# Patient Record
Sex: Female | Born: 1940 | Race: White | Hispanic: No | Marital: Married | Smoking: Never smoker
Health system: Southern US, Community
[De-identification: ages and names within clinical notes are randomized; demographics above are authoritative.]

## PROBLEM LIST (undated history)

## (undated) DIAGNOSIS — G4700 Insomnia, unspecified: Secondary | ICD-10-CM

## (undated) DIAGNOSIS — R319 Hematuria, unspecified: Secondary | ICD-10-CM

## (undated) DIAGNOSIS — N6019 Diffuse cystic mastopathy of unspecified breast: Secondary | ICD-10-CM

## (undated) DIAGNOSIS — T8859XA Other complications of anesthesia, initial encounter: Secondary | ICD-10-CM

## (undated) DIAGNOSIS — D229 Melanocytic nevi, unspecified: Secondary | ICD-10-CM

## (undated) DIAGNOSIS — E785 Hyperlipidemia, unspecified: Secondary | ICD-10-CM

## (undated) DIAGNOSIS — R7309 Other abnormal glucose: Secondary | ICD-10-CM

## (undated) DIAGNOSIS — T7840XA Allergy, unspecified, initial encounter: Secondary | ICD-10-CM

## (undated) DIAGNOSIS — E039 Hypothyroidism, unspecified: Secondary | ICD-10-CM

## (undated) DIAGNOSIS — R05 Cough: Secondary | ICD-10-CM

## (undated) DIAGNOSIS — IMO0001 Reserved for inherently not codable concepts without codable children: Secondary | ICD-10-CM

## (undated) DIAGNOSIS — M810 Age-related osteoporosis without current pathological fracture: Secondary | ICD-10-CM

## (undated) DIAGNOSIS — I10 Essential (primary) hypertension: Secondary | ICD-10-CM

## (undated) DIAGNOSIS — N3000 Acute cystitis without hematuria: Secondary | ICD-10-CM

## (undated) DIAGNOSIS — D039 Melanoma in situ, unspecified: Secondary | ICD-10-CM

## (undated) DIAGNOSIS — E042 Nontoxic multinodular goiter: Secondary | ICD-10-CM

## (undated) DIAGNOSIS — R635 Abnormal weight gain: Secondary | ICD-10-CM

## (undated) DIAGNOSIS — J45909 Unspecified asthma, uncomplicated: Secondary | ICD-10-CM

## (undated) DIAGNOSIS — R51 Headache: Secondary | ICD-10-CM

## (undated) HISTORY — DX: Melanoma in situ, unspecified: D03.9

## (undated) HISTORY — DX: Cough: R05

## (undated) HISTORY — DX: Hematuria, unspecified: R31.9

## (undated) HISTORY — DX: Other abnormal glucose: R73.09

## (undated) HISTORY — DX: Melanocytic nevi, unspecified: D22.9

## (undated) HISTORY — DX: Allergy, unspecified, initial encounter: T78.40XA

## (undated) HISTORY — DX: Essential (primary) hypertension: I10

## (undated) HISTORY — DX: Abnormal weight gain: R63.5

## (undated) HISTORY — PX: POLYPECTOMY: SHX149

## (undated) HISTORY — DX: Headache: R51

## (undated) HISTORY — DX: Age-related osteoporosis without current pathological fracture: M81.0

## (undated) HISTORY — PX: COLONOSCOPY: SHX174

## (undated) HISTORY — DX: Reserved for inherently not codable concepts without codable children: IMO0001

## (undated) HISTORY — DX: Hypothyroidism, unspecified: E03.9

## (undated) HISTORY — DX: Diffuse cystic mastopathy of unspecified breast: N60.19

## (undated) HISTORY — DX: Insomnia, unspecified: G47.00

## (undated) HISTORY — DX: Acute cystitis without hematuria: N30.00

## (undated) HISTORY — DX: Hyperlipidemia, unspecified: E78.5

## (undated) HISTORY — DX: Nontoxic multinodular goiter: E04.2

## (undated) HISTORY — PX: SKIN CANCER EXCISION: SHX779

---

## 1972-11-05 HISTORY — PX: ABDOMINAL HYSTERECTOMY: SHX81

## 1972-11-05 HISTORY — PX: VAGINAL HYSTERECTOMY: SUR661

## 1974-11-05 HISTORY — PX: BREAST LUMPECTOMY: SHX2

## 1998-11-08 ENCOUNTER — Ambulatory Visit (HOSPITAL_COMMUNITY): Admission: RE | Admit: 1998-11-08 | Discharge: 1998-11-08 | Payer: Self-pay | Admitting: Obstetrics and Gynecology

## 1998-11-09 ENCOUNTER — Encounter: Payer: Self-pay | Admitting: Obstetrics and Gynecology

## 2000-03-11 ENCOUNTER — Other Ambulatory Visit: Admission: RE | Admit: 2000-03-11 | Discharge: 2000-03-11 | Payer: Self-pay | Admitting: Obstetrics and Gynecology

## 2000-03-13 ENCOUNTER — Encounter: Payer: Self-pay | Admitting: Obstetrics and Gynecology

## 2000-03-13 ENCOUNTER — Ambulatory Visit (HOSPITAL_COMMUNITY): Admission: RE | Admit: 2000-03-13 | Discharge: 2000-03-13 | Payer: Self-pay | Admitting: Obstetrics and Gynecology

## 2000-04-08 ENCOUNTER — Ambulatory Visit (HOSPITAL_BASED_OUTPATIENT_CLINIC_OR_DEPARTMENT_OTHER): Admission: RE | Admit: 2000-04-08 | Discharge: 2000-04-08 | Payer: Self-pay | Admitting: Specialist

## 2000-04-08 ENCOUNTER — Encounter (INDEPENDENT_AMBULATORY_CARE_PROVIDER_SITE_OTHER): Payer: Self-pay | Admitting: *Deleted

## 2001-07-17 ENCOUNTER — Other Ambulatory Visit: Admission: RE | Admit: 2001-07-17 | Discharge: 2001-07-17 | Payer: Self-pay | Admitting: Internal Medicine

## 2001-07-24 ENCOUNTER — Ambulatory Visit (HOSPITAL_COMMUNITY): Admission: RE | Admit: 2001-07-24 | Discharge: 2001-07-24 | Payer: Self-pay | Admitting: Obstetrics and Gynecology

## 2001-07-24 ENCOUNTER — Encounter: Payer: Self-pay | Admitting: Obstetrics and Gynecology

## 2002-07-28 ENCOUNTER — Encounter: Payer: Self-pay | Admitting: Obstetrics and Gynecology

## 2002-07-28 ENCOUNTER — Ambulatory Visit (HOSPITAL_COMMUNITY): Admission: RE | Admit: 2002-07-28 | Discharge: 2002-07-28 | Payer: Self-pay | Admitting: Obstetrics and Gynecology

## 2002-11-17 DIAGNOSIS — D229 Melanocytic nevi, unspecified: Secondary | ICD-10-CM

## 2002-11-17 HISTORY — DX: Melanocytic nevi, unspecified: D22.9

## 2003-09-14 ENCOUNTER — Ambulatory Visit (HOSPITAL_COMMUNITY): Admission: RE | Admit: 2003-09-14 | Discharge: 2003-09-14 | Payer: Self-pay | Admitting: Obstetrics and Gynecology

## 2004-09-15 ENCOUNTER — Ambulatory Visit (HOSPITAL_COMMUNITY): Admission: RE | Admit: 2004-09-15 | Discharge: 2004-09-15 | Payer: Self-pay | Admitting: Obstetrics and Gynecology

## 2005-08-23 ENCOUNTER — Ambulatory Visit: Payer: Self-pay | Admitting: Endocrinology

## 2005-08-29 ENCOUNTER — Ambulatory Visit: Payer: Self-pay | Admitting: Endocrinology

## 2005-09-13 ENCOUNTER — Ambulatory Visit: Payer: Self-pay | Admitting: Endocrinology

## 2005-10-02 ENCOUNTER — Ambulatory Visit: Payer: Self-pay | Admitting: Endocrinology

## 2006-09-19 ENCOUNTER — Ambulatory Visit: Payer: Self-pay | Admitting: Endocrinology

## 2006-09-19 LAB — CONVERTED CEMR LAB
Albumin: 4 g/dL (ref 3.5–5.2)
Alkaline Phosphatase: 78 units/L (ref 39–117)
Bacteria, U Microscopic: NEGATIVE /hpf
Basophils Absolute: 0 10*3/uL (ref 0.0–0.1)
CO2: 29 meq/L (ref 19–32)
Cholesterol: 169 mg/dL (ref 0–200)
Glomerular Filtration Rate, Af Am: 93 mL/min/{1.73_m2}
Glucose, Bld: 95 mg/dL (ref 70–99)
Ketones, ur: NEGATIVE mg/dL
LDL Cholesterol: 82 mg/dL (ref 0–99)
Monocytes Relative: 5.5 % (ref 3.0–11.0)
Platelets: 300 10*3/uL (ref 150–400)
Potassium: 4.5 meq/L (ref 3.5–5.1)
RBC: 4.42 M/uL (ref 3.87–5.11)
RDW: 12.9 % (ref 11.5–14.6)
Sodium: 142 meq/L (ref 135–145)
TSH: 0.86 microintl units/mL (ref 0.35–5.50)
Total Bilirubin: 0.8 mg/dL (ref 0.3–1.2)
Total Protein: 6.7 g/dL (ref 6.0–8.3)
VLDL: 16 mg/dL (ref 0–40)
pH: 7 (ref 5.0–8.0)

## 2006-09-23 ENCOUNTER — Ambulatory Visit: Payer: Self-pay | Admitting: Endocrinology

## 2007-03-19 ENCOUNTER — Ambulatory Visit: Payer: Self-pay | Admitting: Internal Medicine

## 2007-06-18 ENCOUNTER — Encounter: Payer: Self-pay | Admitting: Endocrinology

## 2007-07-21 ENCOUNTER — Encounter: Payer: Self-pay | Admitting: Endocrinology

## 2007-07-21 DIAGNOSIS — M810 Age-related osteoporosis without current pathological fracture: Secondary | ICD-10-CM

## 2007-07-21 DIAGNOSIS — E785 Hyperlipidemia, unspecified: Secondary | ICD-10-CM

## 2007-07-21 DIAGNOSIS — E782 Mixed hyperlipidemia: Secondary | ICD-10-CM | POA: Insufficient documentation

## 2007-07-21 DIAGNOSIS — M8580 Other specified disorders of bone density and structure, unspecified site: Secondary | ICD-10-CM | POA: Insufficient documentation

## 2007-07-21 DIAGNOSIS — E039 Hypothyroidism, unspecified: Secondary | ICD-10-CM

## 2007-07-21 HISTORY — DX: Hypothyroidism, unspecified: E03.9

## 2007-07-21 HISTORY — DX: Hyperlipidemia, unspecified: E78.5

## 2007-07-21 HISTORY — DX: Age-related osteoporosis without current pathological fracture: M81.0

## 2007-09-24 ENCOUNTER — Ambulatory Visit: Payer: Self-pay | Admitting: Endocrinology

## 2007-09-24 LAB — CONVERTED CEMR LAB
ALT: 22 units/L (ref 0–35)
AST: 21 units/L (ref 0–37)
Albumin: 4.1 g/dL (ref 3.5–5.2)
Alkaline Phosphatase: 82 units/L (ref 39–117)
BUN: 11 mg/dL (ref 6–23)
Basophils Relative: 0.8 % (ref 0.0–1.0)
Bilirubin, Direct: 0.1 mg/dL (ref 0.0–0.3)
Calcium: 9.5 mg/dL (ref 8.4–10.5)
Chloride: 105 meq/L (ref 96–112)
Cholesterol: 164 mg/dL (ref 0–200)
GFR calc Af Amer: 108 mL/min
GFR calc non Af Amer: 90 mL/min
Glucose, Bld: 105 mg/dL — ABNORMAL HIGH (ref 70–99)
HDL: 62.7 mg/dL (ref >39.0)
Hemoglobin: 13.9 g/dL (ref 12.0–15.0)
Ketones, ur: NEGATIVE mg/dL
Lymphocytes Relative: 22.3 % (ref 12.0–46.0)
Monocytes Absolute: 0.4 10*3/uL (ref 0.2–0.7)
Monocytes Relative: 5.3 % (ref 3.0–11.0)
Neutro Abs: 5.4 10*3/uL (ref 1.4–7.7)
Specific Gravity, Urine: 1.025 (ref 1.000–1.03)
VLDL: 19 mg/dL (ref 0–40)
pH: 6 (ref 5.0–8.0)

## 2007-09-26 ENCOUNTER — Ambulatory Visit: Payer: Self-pay | Admitting: Endocrinology

## 2007-09-26 DIAGNOSIS — R739 Hyperglycemia, unspecified: Secondary | ICD-10-CM | POA: Insufficient documentation

## 2007-09-26 DIAGNOSIS — R7309 Other abnormal glucose: Secondary | ICD-10-CM

## 2007-09-26 DIAGNOSIS — N3000 Acute cystitis without hematuria: Secondary | ICD-10-CM | POA: Insufficient documentation

## 2007-09-26 HISTORY — DX: Acute cystitis without hematuria: N30.00

## 2007-09-26 HISTORY — DX: Other abnormal glucose: R73.09

## 2007-10-13 ENCOUNTER — Telehealth (INDEPENDENT_AMBULATORY_CARE_PROVIDER_SITE_OTHER): Payer: Self-pay | Admitting: *Deleted

## 2007-11-12 ENCOUNTER — Encounter: Payer: Self-pay | Admitting: Endocrinology

## 2008-06-08 ENCOUNTER — Ambulatory Visit: Payer: Self-pay | Admitting: Endocrinology

## 2008-06-08 DIAGNOSIS — G4700 Insomnia, unspecified: Secondary | ICD-10-CM

## 2008-06-08 DIAGNOSIS — R635 Abnormal weight gain: Secondary | ICD-10-CM

## 2008-06-08 DIAGNOSIS — R51 Headache: Secondary | ICD-10-CM | POA: Insufficient documentation

## 2008-06-08 DIAGNOSIS — R519 Headache, unspecified: Secondary | ICD-10-CM | POA: Insufficient documentation

## 2008-06-08 HISTORY — DX: Headache: R51

## 2008-06-08 HISTORY — DX: Abnormal weight gain: R63.5

## 2008-06-08 HISTORY — DX: Insomnia, unspecified: G47.00

## 2008-06-23 ENCOUNTER — Encounter: Payer: Self-pay | Admitting: Endocrinology

## 2008-08-05 ENCOUNTER — Encounter: Payer: Self-pay | Admitting: Endocrinology

## 2008-10-05 ENCOUNTER — Ambulatory Visit: Payer: Self-pay | Admitting: Endocrinology

## 2008-10-05 DIAGNOSIS — R319 Hematuria, unspecified: Secondary | ICD-10-CM

## 2008-10-05 HISTORY — DX: Hematuria, unspecified: R31.9

## 2008-10-11 ENCOUNTER — Telehealth: Payer: Self-pay | Admitting: Endocrinology

## 2008-10-11 ENCOUNTER — Ambulatory Visit: Payer: Self-pay | Admitting: Endocrinology

## 2008-10-21 ENCOUNTER — Ambulatory Visit: Payer: Self-pay | Admitting: Endocrinology

## 2008-10-21 ENCOUNTER — Telehealth (INDEPENDENT_AMBULATORY_CARE_PROVIDER_SITE_OTHER): Payer: Self-pay | Admitting: *Deleted

## 2008-10-22 LAB — CONVERTED CEMR LAB
Bacteria, UA: NEGATIVE
Bilirubin Urine: NEGATIVE
Ketones, ur: NEGATIVE mg/dL
pH: 6 (ref 5.0–8.0)

## 2008-11-02 ENCOUNTER — Telehealth (INDEPENDENT_AMBULATORY_CARE_PROVIDER_SITE_OTHER): Payer: Self-pay | Admitting: *Deleted

## 2008-11-17 ENCOUNTER — Telehealth (INDEPENDENT_AMBULATORY_CARE_PROVIDER_SITE_OTHER): Payer: Self-pay | Admitting: *Deleted

## 2008-11-19 ENCOUNTER — Ambulatory Visit: Payer: Self-pay | Admitting: Endocrinology

## 2008-11-22 DIAGNOSIS — I10 Essential (primary) hypertension: Secondary | ICD-10-CM | POA: Insufficient documentation

## 2008-11-22 HISTORY — DX: Essential (primary) hypertension: I10

## 2008-12-20 ENCOUNTER — Ambulatory Visit: Payer: Self-pay | Admitting: Gastroenterology

## 2008-12-31 ENCOUNTER — Telehealth: Payer: Self-pay | Admitting: Gastroenterology

## 2009-01-05 ENCOUNTER — Ambulatory Visit: Payer: Self-pay | Admitting: Gastroenterology

## 2009-01-09 ENCOUNTER — Encounter: Payer: Self-pay | Admitting: Gastroenterology

## 2009-06-20 ENCOUNTER — Ambulatory Visit: Payer: Self-pay | Admitting: Endocrinology

## 2009-06-20 DIAGNOSIS — R05 Cough: Secondary | ICD-10-CM | POA: Insufficient documentation

## 2009-06-20 DIAGNOSIS — R059 Cough, unspecified: Secondary | ICD-10-CM | POA: Insufficient documentation

## 2009-06-20 DIAGNOSIS — Z789 Other specified health status: Secondary | ICD-10-CM | POA: Insufficient documentation

## 2009-06-20 HISTORY — DX: Cough, unspecified: R05.9

## 2009-08-05 LAB — HM MAMMOGRAPHY: HM Mammogram: NORMAL

## 2009-10-10 ENCOUNTER — Ambulatory Visit: Payer: Self-pay | Admitting: Internal Medicine

## 2009-10-10 ENCOUNTER — Ambulatory Visit: Payer: Self-pay | Admitting: Endocrinology

## 2009-10-10 DIAGNOSIS — N6019 Diffuse cystic mastopathy of unspecified breast: Secondary | ICD-10-CM | POA: Insufficient documentation

## 2009-10-10 DIAGNOSIS — N6011 Diffuse cystic mastopathy of right breast: Secondary | ICD-10-CM | POA: Insufficient documentation

## 2009-10-10 HISTORY — DX: Diffuse cystic mastopathy of unspecified breast: N60.19

## 2009-10-11 ENCOUNTER — Encounter: Admission: RE | Admit: 2009-10-11 | Discharge: 2009-10-11 | Payer: Self-pay | Admitting: Endocrinology

## 2009-10-11 DIAGNOSIS — E042 Nontoxic multinodular goiter: Secondary | ICD-10-CM

## 2009-10-11 HISTORY — DX: Nontoxic multinodular goiter: E04.2

## 2009-10-27 ENCOUNTER — Ambulatory Visit: Payer: Self-pay | Admitting: Endocrinology

## 2009-11-05 HISTORY — PX: THYROID SURGERY: SHX805

## 2009-11-11 ENCOUNTER — Telehealth: Payer: Self-pay | Admitting: Endocrinology

## 2010-01-04 ENCOUNTER — Other Ambulatory Visit: Admission: RE | Admit: 2010-01-04 | Discharge: 2010-01-04 | Payer: Self-pay | Admitting: Interventional Radiology

## 2010-01-04 ENCOUNTER — Encounter: Payer: Self-pay | Admitting: Endocrinology

## 2010-01-04 ENCOUNTER — Encounter: Admission: RE | Admit: 2010-01-04 | Discharge: 2010-01-04 | Payer: Self-pay | Admitting: Endocrinology

## 2010-01-06 ENCOUNTER — Ambulatory Visit: Payer: Self-pay | Admitting: Endocrinology

## 2010-01-06 LAB — CONVERTED CEMR LAB
ALT: 23 units/L (ref 0–35)
Albumin: 3.8 g/dL (ref 3.5–5.2)
Alkaline Phosphatase: 80 units/L (ref 39–117)
Bilirubin, Direct: 0.1 mg/dL (ref 0.0–0.3)
Cholesterol: 213 mg/dL — ABNORMAL HIGH (ref 0–200)
Total Protein: 6.9 g/dL (ref 6.0–8.3)
VLDL: 29.2 mg/dL (ref 0.0–40.0)

## 2010-01-09 ENCOUNTER — Ambulatory Visit: Payer: Self-pay | Admitting: Endocrinology

## 2010-01-12 ENCOUNTER — Telehealth (INDEPENDENT_AMBULATORY_CARE_PROVIDER_SITE_OTHER): Payer: Self-pay | Admitting: *Deleted

## 2010-03-08 ENCOUNTER — Encounter: Payer: Self-pay | Admitting: Endocrinology

## 2010-10-23 ENCOUNTER — Encounter: Payer: Self-pay | Admitting: Endocrinology

## 2010-10-23 ENCOUNTER — Ambulatory Visit: Payer: Self-pay | Admitting: Endocrinology

## 2010-10-23 DIAGNOSIS — IMO0001 Reserved for inherently not codable concepts without codable children: Secondary | ICD-10-CM | POA: Insufficient documentation

## 2010-10-23 HISTORY — DX: Reserved for inherently not codable concepts without codable children: IMO0001

## 2010-10-23 LAB — CONVERTED CEMR LAB
Albumin: 4.4 g/dL (ref 3.5–5.2)
Alkaline Phosphatase: 75 units/L (ref 39–117)
BUN: 14 mg/dL (ref 6–23)
Basophils Absolute: 0.1 10*3/uL (ref 0.0–0.1)
Bilirubin Urine: NEGATIVE
Bilirubin, Direct: 0.2 mg/dL (ref 0.0–0.3)
CO2: 27 meq/L (ref 19–32)
Calcium: 9.9 mg/dL (ref 8.4–10.5)
Cholesterol: 257 mg/dL — ABNORMAL HIGH (ref 0–200)
Creatinine, Ser: 0.7 mg/dL (ref 0.4–1.2)
Direct LDL: 153.6 mg/dL
Eosinophils Absolute: 0.1 10*3/uL (ref 0.0–0.7)
Glucose, Bld: 89 mg/dL (ref 70–99)
Hgb A1c MFr Bld: 5.6 % (ref 4.6–6.5)
Lymphocytes Relative: 21.2 % (ref 12.0–46.0)
MCHC: 34.8 g/dL (ref 30.0–36.0)
MCV: 93.2 fL (ref 78.0–100.0)
Monocytes Absolute: 0.6 10*3/uL (ref 0.1–1.0)
Neutrophils Relative %: 70.1 % (ref 43.0–77.0)
Nitrite: NEGATIVE
RDW: 13.9 % (ref 11.5–14.6)
Total Protein, Urine: NEGATIVE mg/dL
Triglycerides: 227 mg/dL — ABNORMAL HIGH (ref 0.0–149.0)
pH: 6 (ref 5.0–8.0)

## 2010-10-25 ENCOUNTER — Telehealth: Payer: Self-pay | Admitting: Internal Medicine

## 2010-11-25 ENCOUNTER — Encounter: Payer: Self-pay | Admitting: Obstetrics and Gynecology

## 2010-12-03 LAB — CONVERTED CEMR LAB
ALT: 18 units/L (ref 0–35)
ALT: 21 units/L (ref 0–35)
AST: 19 units/L (ref 0–37)
Albumin: 4.2 g/dL (ref 3.5–5.2)
Alkaline Phosphatase: 80 units/L (ref 39–117)
Basophils Absolute: 0.1 10*3/uL (ref 0.0–0.1)
Basophils Relative: 0.9 % (ref 0.0–3.0)
Bilirubin Urine: NEGATIVE
Calcium, Total (PTH): 9.7 mg/dL (ref 8.4–10.5)
Calcium: 9.1 mg/dL (ref 8.4–10.5)
Cholesterol: 207 mg/dL (ref 0–200)
Creatinine, Ser: 0.6 mg/dL (ref 0.4–1.2)
Crystals: NEGATIVE
Direct LDL: 107.9 mg/dL
Direct LDL: 129 mg/dL
Eosinophils Relative: 1.2 % (ref 0.0–5.0)
GFR calc Af Amer: 129 mL/min
GFR calc non Af Amer: 107 mL/min
GFR calc non Af Amer: 88.83 mL/min (ref >60)
Glucose, Bld: 97 mg/dL (ref 70–99)
HCT: 40.5 % (ref 36.0–46.0)
HDL: 65.7 mg/dL (ref >39.00)
Hemoglobin: 13.5 g/dL (ref 12.0–15.0)
Hemoglobin: 14 g/dL (ref 12.0–15.0)
Hgb A1c MFr Bld: 5.1 % (ref 4.6–6.0)
Ketones, ur: NEGATIVE mg/dL
Lymphocytes Relative: 22.1 % (ref 12.0–46.0)
Lymphs Abs: 1.7 10*3/uL (ref 0.7–4.0)
MCHC: 34.4 g/dL (ref 30.0–36.0)
Monocytes Absolute: 0.5 10*3/uL (ref 0.1–1.0)
Monocytes Relative: 5.7 % (ref 3.0–12.0)
Neutro Abs: 4.9 10*3/uL (ref 1.4–7.7)
Neutro Abs: 6.8 10*3/uL (ref 1.4–7.7)
Nitrite: NEGATIVE
PTH: 33.1 pg/mL (ref 14.0–72.0)
Platelets: 268 10*3/uL (ref 150–400)
Potassium: 4.1 meq/L (ref 3.5–5.1)
RBC: 4.26 M/uL (ref 3.87–5.11)
RDW: 12 % (ref 11.5–14.6)
Sodium: 140 meq/L (ref 135–145)
Specific Gravity, Urine: 1.02 (ref 1.000–1.030)
TSH: 0.87 microintl units/mL (ref 0.35–5.50)
Total Bilirubin: 0.9 mg/dL (ref 0.3–1.2)
Total CHOL/HDL Ratio: 3
Total Protein, Urine: NEGATIVE mg/dL
Triglycerides: 136 mg/dL (ref 0–149)
Urine Glucose: NEGATIVE mg/dL
Urobilinogen, UA: 0.2 (ref 0.0–1.0)
VLDL: 26.6 mg/dL (ref 0.0–40.0)
VLDL: 27 mg/dL (ref 0–40)
Varicella IgG: 1.92 — ABNORMAL HIGH
WBC: 9.2 10*3/uL (ref 4.5–10.5)
pH: 6 (ref 5.0–8.0)

## 2010-12-07 NOTE — Progress Notes (Signed)
Summary: UTI sxs-SAE pt  Phone Note Call from Patient Call back at Home Phone [phone]   Caller: Patient Summary of Call: PT was told by SAE on Monday that she had a slight bladder infection on phone tree message and to callback for rx if she was having sxs. Per pt, she is having frequency and urgency. Would like rx called in for UTI sxs to [facility]-please advise Initial call taken by: Brenton Grills CMA Duncan Dull),  October 25, 2010 10:48 AM  Follow-up for Phone Call        Rx Called In to [facility]-left message for pt to callback office to inform Follow-up by: Brenton Grills CMA Duncan Dull),  October 25, 2010 11:05 AM  Additional Follow-up for Phone Call Additional follow up Details #1::        pt informed. Copy of labs mailed to pt's address on file per pt Additional Follow-up by: Brenton Grills CMA Duncan Dull),  October 25, 2010 2:02 PM    New/Updated Medications: CIPROFLOXACIN HCL 500 MG TABS (CIPROFLOXACIN HCL) One by mouth two times a day for 5 days Prescriptions: CIPROFLOXACIN HCL 500 MG TABS (CIPROFLOXACIN HCL) One by mouth two times a day for 5 days  #10 x 1   Entered and Authorized by:   Etta Grandchild MD   Signed by:   Etta Grandchild MD on 10/25/2010   Method used:   Historical   RxID:   [id number]

## 2010-12-07 NOTE — Progress Notes (Signed)
Summary: referral  Phone Note Call from Patient   Caller: Patient [phone] Complaint: Chest Pain Summary of Call: pt called to check status of referral to surgeon. please advise Initial call taken by: Margaret Pyle, CMA,  January 12, 2010 4:50 PM  Follow-up for Phone Call        There is no order  in computer for a surgical referral. Follow-up by: Dagoberto Reef,  January 13, 2010 8:00 AM  Additional Follow-up for Phone Call Additional follow up Details #1::        i see it on 01/09/10 already done Shelbie Proctor  January 16, 2010 8:04 AM   Additional Follow-up by: Minus Breeding MD,  January 13, 2010 12:32 PM    Additional Follow-up for Phone Call Additional follow up Details #2::    Dr Everardo All i see it in ov on 01/09/10 but you never put a referral in for it .Need a referral.  Thanks  Follow-up by: Dagoberto Reef,  January 13, 2010 2:21 PM  Additional Follow-up for Phone Call Additional follow up Details #3:: Details for Additional Follow-up Action Taken: i discussed with Maisha.  my computer shows it was ordered, but her computer does not show it.  i have resent.  please advise pt this appears to be a computer problem, and we are resending. Additional Follow-up by: Minus Breeding MD,  January 13, 2010 4:15 PM

## 2010-12-07 NOTE — Assessment & Plan Note (Signed)
Summary: YEARLY FU/ MEDICARE/ LABS LATER/NWS   Vital Signs:  Patient profile:   70 year old female Height:      60 inches (152.40 cm) Weight:      144.38 pounds (65.63 kg) BMI:     28.30 O2 Sat:      94 % on Room air Temp:     98.4 degrees F (36.89 degrees C) oral Pulse rate:   77 / minute Pulse rhythm:   regular BP sitting:   132 / 76  (right arm) Cuff size:   regular  Vitals Entered By: Brenton Grills CMA Duncan Dull) (October 23, 2010 11:12 AM)  O2 Flow:  Room air CC: Yearly F/U/discuss Lipitor/pt declined flu shot/aj Is Patient Diabetic? No Comments Pt declined tetanus (allergic reaction)   Primary Provider:  Minus Breeding MD  CC:  Yearly F/U/discuss Lipitor/pt declined flu shot/aj.  History of Present Illness: pt is here for medicare welllness visit.  she denies memory loss and depression.  she says she is able to perform activities of daily living without assistance.  she has no limitations to physical activity.  Current Medications (verified): 1)  Synthroid 25 Mcg  Tabs (Levothyroxine Sodium) .... Take 1 By Mouth Qd 2)  Calcium 1250 Mg  Tabs (Calcium Carbonate) .... Take 1 By Mouth Qd 3)  Vitamin D 1000 Unit  Tabs (Cholecalciferol) .... Take 1 By Mouth Qd 4)  Trazodone Hcl 100 Mg  Tabs (Trazodone Hcl) .... At Bedtime As Needed Sleep 5)  Benicar 20 Mg Tabs (Olmesartan Medoxomil) .Marland Kitchen.. 1 Qd 6)  Fosamax 70 Mg Tabs (Alendronate Sodium) .Marland Kitchen.. 1 Q Week.  Pt Requests Brand Name. 7)  Lipitor 20 Mg Tabs (Atorvastatin Calcium) .Marland Kitchen.. 1 Once Daily 8)  Fish Oil 435 Mg Caps (Omega-3 Fatty Acids) .... (400mg ) 1 By Mouth Once Daily  Allergies (verified): 1)  ! Sulfa 2)  ! Lisinopril (Lisinopril) 3)  ! Cozaar (Losartan Potassium) 4)  ! Lipitor (Atorvastatin Calcium)  Past History:  Past Medical History: Benign (L) Thyroid Nodule Hematuria, w/u NEG ACUTE CYSTITIS (ICD-595.0) HYPERGLYCEMIA (ICD-790.29) ROUTINE GENERAL MEDICAL EXAM@HEALTH  CARE FACL (ICD-V70.0) OSTEOPOROSIS  (ICD-733.00) HYPOTHYROIDISM (ICD-244.9) HYPERLIPIDEMIA (ICD-272.4) Hypertension  gyn: [facility]: [facility] ([facility])  Family History: Reviewed history from 09/26/2007 and no changes required. patient's mother had ovarian cancer.  Her sister has lymphoma, breast cancer, and kidney cancer.  Another sister has leukemia  Social History: Reviewed history from 09/26/2007 and no changes required. married doesn't work outside the home  Review of Systems  The patient denies vision loss and decreased hearing.    Physical Exam  General:  normal appearance.   Eyes:  (sees opthal) Ears:  grossly normal hearing.   Msk:  pt easily and quickly performs "get-up-and-go" from a sitting position  Psych:  remembers 3/3 at 5 minutes.  excellent recall.  can easily read and write a sentence.  alert and oriented x 3 Additional Exam:  SEPARATE EVALUATION FOLLOWS--EACH PROBLEM HERE IS NEW, NOT RESPONDING TO TREATMENT, OR POSES SIGNIFICANT RISK TO THE PATIENT'S HEALTH: HISTORY OF THE PRESENT ILLNESS: pt says lipitor causes myalgias, worst at the legs.   PAST MEDICAL HISTORY reviewed and up to date today REVIEW OF SYSTEMS: she also has muscle weakness PHYSICAL EXAMINATION: psych:  does not appear anxious nor depressed LAB/XRAY RESULTS: Cholesterol LDL  153.6 mg/dL Sed Rate             [H]  23 mm/hr  IMPRESSION: dyslipidemia, therapy is limited by perceived drug intolerance PLAN:  see instruction page    Impression & Recommendations:  Problem # 1:  ROUTINE GENERAL MEDICAL EXAM@HEALTH  CARE FACL (ICD-V70.0)  Medications Added to Medication List This Visit: 1)  Fish Oil 435 Mg Caps (Omega-3 fatty acids) .... (400mg ) 1 by mouth once daily 2)  Crestor 5 Mg Tabs (Rosuvastatin calcium) .Marland Kitchen.. 1 tab once daily 3)  Colestipol Hcl 1 Gm Tabs (Colestipol hcl) .... 3 tabs once daily 4)  Alendronate Sodium 70 Mg Tabs (Alendronate sodium) .Marland Kitchen.. 1 tab q week  Other Orders: EKG w/ Interpretation  (93000) TLB-Lipid Panel (80061-LIPID) TLB-BMP (Basic Metabolic Panel-BMET) (80048-METABOL) TLB-CBC Platelet - w/Differential (85025-CBCD) TLB-Hepatic/Liver Function Pnl (80076-HEPATIC) TLB-TSH (Thyroid Stimulating Hormone) (84443-TSH) TLB-A1C / Hgb A1C (Glycohemoglobin) (83036-A1C) TLB-Udip w/ Micro (81001-URINE) TLB-Sedimentation Rate (ESR) (85652-ESR) Est. Patient Level III (16109) Medicare -1st Annual Wellness Visit [phone])  Patient Instructions: 1)  blood tests are being ordered for you today.  please call [phone] to hear your test results. 2)  pending the test results, please take crestor 5 mg once daily, and colestipol 3x 1 gram once daily. 3)  when you run out of benicar samples, call us, so i can change it to a similar generic 4)  here is a list of medicare-covered preventive services 5)  please consider these measures for your health:  minimize alcohol.  do not use tobacco products.  have a colonoscopy at least every 10 years from age 46.  keep firearms safely stored.  always use seat belts.  have working smoke alarms in your home.  see an eye doctor and dentist regularly.  never drive under the influence of alcohol or drugs (including prescription drugs).  those with fair skin should take precautions against the sun. 6)  please let me know what your wishes would be, if artificial life support measures should become necessary.  it is critically important to prevent falling down (keep floor areas well-lit, dry, and free of loose objects) 7)  (update; we discussed code status.  pt requests full code, but would not want to be started or maintained on artificial life-support measures if there was not a reasonable chance of recovery) 8)  (update: i left message on phone-tree:  rx as we discussed.  call if urinary sxs.  go to lab in 1-2 mos for lipids 272.0, and liver v58.69) Prescriptions: ALENDRONATE SODIUM 70 MG TABS (ALENDRONATE SODIUM) 1 tab q week  #12 x 3   Entered and Authorized  by:   Minus Breeding MD   Signed by:   Minus Breeding MD on 10/23/2010   Method used:   Faxed to ...       [facility] (mail-order)             , [place]         Ph: [phone]       Fax: [phone]   RxID:   [id number] COLESTIPOL HCL 1 GM TABS (COLESTIPOL HCL) 3 tabs once daily  #270 x 3   Entered and Authorized by:   Minus Breeding MD   Signed by:   Minus Breeding MD on 10/23/2010   Method used:   Faxed to ...       [facility] (mail-order)             , [place]         Ph: [phone]       Fax: [phone]   RxID:   [id number] CRESTOR 5 MG TABS (ROSUVASTATIN CALCIUM) 1 tab once daily  #  90 x 3   Entered and Authorized by:   Minus Breeding MD   Signed by:   Minus Breeding MD on 10/23/2010   Method used:   Faxed to ...       [facility] (mail-order)             , [place]         Ph: [phone]       Fax: [phone]   RxID:   [id number]    Orders Added: 1)  EKG w/ Interpretation [93000] 2)  TLB-Lipid Panel [80061-LIPID] 3)  TLB-BMP (Basic Metabolic Panel-BMET) [80048-METABOL] 4)  TLB-CBC Platelet - w/Differential [85025-CBCD] 5)  TLB-Hepatic/Liver Function Pnl [80076-HEPATIC] 6)  TLB-TSH (Thyroid Stimulating Hormone) [84443-TSH] 7)  TLB-A1C / Hgb A1C (Glycohemoglobin) [83036-A1C] 8)  TLB-Udip w/ Micro [81001-URINE] 9)  TLB-Sedimentation Rate (ESR) [85652-ESR] 10)  Est. Patient Level III [53664] 11)  Medicare -1st Annual Wellness Visit [G0438]   Not Administered:    Influenza Vaccine not given due to: declined     Preventive Care Screening  Mammogram:    Date:  06/05/2010    Results:  normal   Pap Smear:    Date:  04/05/2010    Results:  normal

## 2010-12-07 NOTE — Consult Note (Signed)
Summary: [facility]   Imported By: Lester Summerville 04/12/2010 07:47:30  _____________________________________________________________________  External Attachment:    Type:   Image     Comment:   External Document

## 2010-12-07 NOTE — Progress Notes (Signed)
Summary: Fosamax rx  Phone Note Call from Patient Call back at [phone]   Summary of Call: Patient called after receiving her BD results from the phone tree. Patient would like her Fosamax prescription sent to [facility] and prefers the brand name if at all possible. Patient also wanted to know if she should continue taking Ca+ and Vit D with that prescription. Please advise if patient should make any changes to her current regimen. Initial call taken by: Lucious Groves,  November 11, 2009 11:59 AM  Follow-up for Phone Call        i sent rx.  please continue same ca++ and vitamin-d. Follow-up by: Minus Breeding MD,  November 11, 2009 12:47 PM  Additional Follow-up for Phone Call Additional follow up Details #1::        Patient notified. Additional Follow-up by: Lucious Groves,  November 11, 2009 1:20 PM    New/Updated Medications: FOSAMAX 70 MG TABS (ALENDRONATE SODIUM) 1 q week.  pt requests brand name. Prescriptions: FOSAMAX 70 MG TABS (ALENDRONATE SODIUM) 1 q week.  pt requests brand name.  #4 x 11   Entered and Authorized by:   Minus Breeding MD   Signed by:   Minus Breeding MD on 11/11/2009   Method used:   Electronically to        [facility] [address] (retail)       [address] [place], [place]  [address]       Ph: [phone]       Fax: [phone]   RxID:   [id number]

## 2010-12-07 NOTE — Assessment & Plan Note (Signed)
Summary: DISCUSS RESULTS/NWS  #   Vital Signs:  Patient profile:   70 year old female Height:      60 inches (152.40 cm) Weight:      146.38 pounds (66.54 kg) O2 Sat:      97 % on Room air Temp:     98.3 degrees F (36.83 degrees C) oral Pulse rate:   80 / minute BP sitting:   128 / 70  (left arm) Cuff size:   regular  Vitals Entered By: Josph Macho RMA (January 09, 2010 1:13 PM)  O2 Flow:  Room air CC: Discuss results/ CF   Primary Provider:  Minus Breeding MD  CC:  Discuss results/ CF.  History of Present Illness: the status of 3 chronic medical problems is addressed today: pt is here to f/u goiter.  no neck pain. htn:  she takes bp meds as rx'ed. dyslipidemia:  she had myalgias on lipitor 40/day, and wants to try 20.   Current Medications (verified): 1)  Synthroid 25 Mcg  Tabs (Levothyroxine Sodium) .... Take 1 By Mouth Qd 2)  Calcium 1250 Mg  Tabs (Calcium Carbonate) .... Take 1 By Mouth Qd 3)  Vitamin D 1000 Unit  Tabs (Cholecalciferol) .... Take 1 By Mouth Qd 4)  Trazodone Hcl 100 Mg  Tabs (Trazodone Hcl) .... At Bedtime As Needed Sleep 5)  Simvastatin 40 Mg Tabs (Simvastatin) .Marland Kitchen.. 1 Qd 6)  Benicar 20 Mg Tabs (Olmesartan Medoxomil) .Marland Kitchen.. 1 Qd 7)  Fosamax 70 Mg Tabs (Alendronate Sodium) .Marland Kitchen.. 1 Q Week.  Pt Requests Brand Name.  Allergies (verified): 1)  ! Sulfa 2)  ! Lisinopril (Lisinopril) 3)  ! Cozaar (Losartan Potassium) 4)  ! Lipitor (Atorvastatin Calcium)  Past History:  Past Medical History: Last updated: 11/19/2008 Benign (L) Thyroid Nodule Hematuria, w/u NEG ACUTE CYSTITIS (ICD-595.0) HYPERGLYCEMIA (ICD-790.29) ROUTINE GENERAL MEDICAL EXAM@HEALTH  CARE FACL (ICD-V70.0) OSTEOPOROSIS (ICD-733.00) HYPOTHYROIDISM (ICD-244.9) HYPERLIPIDEMIA (ICD-272.4) Hypertension  Review of Systems       denies difficulty swallowing or breathing.  Physical Exam  General:  normal appearance.   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 1 once daily  Other Orders: Surgical Referral (Surgery) Est. Patient Level IV (16109)  Patient Instructions: 1)  refer surgery. you will be called with a day and time for an appointment 2)  try lipitor at 20 mg once daily. in 3 months, come in for lipids 272.0 and liver v58.69 3)  we can add colestipol later if necessary. 4)  same benicar. Prescriptions: FOSAMAX 70 MG TABS (ALENDRONATE SODIUM) 1 q week.  pt requests brand name.  #4 x 11   Entered and Authorized by:   Minus Breeding MD   Signed by:   Minus Breeding MD on 01/09/2010   Method used:   Electronically to        [facility] (mail-order)             ,          Ph: [phone]       Fax: [phone]   RxID:   [id number] LIPITOR 20 MG TABS (ATORVASTATIN CALCIUM) 1 once daily  #  90 x 3   Entered and Authorized by:   Minus Breeding MD   Signed by:   Minus Breeding MD on 01/09/2010   Method used:   Electronically to        [facility] (mail-order)             ,          Ph: [phone]       Fax: [phone]   RxID:   [id number]   Preventive Care Screening  Mammogram:    Date:  05/05/2009    Results:  historical

## 2011-03-07 DIAGNOSIS — D039 Melanoma in situ, unspecified: Secondary | ICD-10-CM

## 2011-03-07 HISTORY — DX: Melanoma in situ, unspecified: D03.9

## 2011-03-23 NOTE — Op Note (Signed)
[facility]  Patient:    NATHANIEL, YADEN                      MRN: [id number] Proc. Date: 04/08/00 Attending:  Earvin Hansen L. Shon Hough, M.D. CC:         Yaakov Guthrie. Shon Hough, M.D. x 2                           Operative Report  PREOPERATIVE DIAGNOSIS:  POSTOPERATIVE DIAGNOSIS:  PROCEDURE:  Excision of mass with liposuction assistance.  SURGEON:  Yaakov Guthrie. Shon Hough, M.D.  ANESTHESIA:  Local using ______ solution, total of 150 cc.  INDICATIONS:  A 70 year old lady with enlarging mass involving the left scapula area and increased discomfort, especially when she is driving or putting pressure against _____.  The patient also feels the mass has also grown more over the last few months.  DESCRIPTION OF PROCEDURE:  The patient was taken to the operating room and placed in the prone position, and was given adequate prep to her back using Betadine soap and solution, and walled off with sterile towels and drapes so as to make a sterile field.  The area was injected with ______ solution, measuring approximately 12 x 12 cm.  After this, sterile dressing placed. Liposuction was fashioned over the mass from the 6 oclock and 9 oclock position using a spatula catheter, 18-3 and 4s, removing large amounts of what looks like fatty material.  After we had taken the area down to a flattened contour, the wounds then were closed with 5-0 nylon after the wound was drained with a #7 Blake drain, and brought out through one of the incisions, and secured with a 5-0 Prolene. Large amounts of fluffy dressings were applied and Hypafix tape.  She withstood the procedures very well and was taken to recovery in good condition. DD:  04/08/00 TD:  04/12/00 Job: [id number]

## 2011-05-07 ENCOUNTER — Other Ambulatory Visit: Payer: Self-pay | Admitting: Dermatology

## 2011-07-11 LAB — HM MAMMOGRAPHY: HM Mammogram: NORMAL

## 2011-07-24 ENCOUNTER — Other Ambulatory Visit: Payer: Self-pay | Admitting: Physician Assistant

## 2011-07-30 ENCOUNTER — Telehealth: Payer: Self-pay | Admitting: *Deleted

## 2011-07-30 ENCOUNTER — Other Ambulatory Visit (INDEPENDENT_AMBULATORY_CARE_PROVIDER_SITE_OTHER): Payer: Self-pay

## 2011-07-30 DIAGNOSIS — E042 Nontoxic multinodular goiter: Secondary | ICD-10-CM

## 2011-07-30 LAB — TSH: TSH: 1.78 u[IU]/mL (ref 0.35–5.50)

## 2011-07-30 LAB — T4, FREE: Free T4: 0.83 ng/dL (ref 0.60–1.60)

## 2011-07-30 NOTE — Telephone Encounter (Signed)
i ordered

## 2011-07-30 NOTE — Telephone Encounter (Signed)
Pt had thyroid surgery last month at [facility] and her MD wants her to have Thyroid profile done (pt wants to do labs here so she doesn't have to go to [facility]). Pt wants to come in for labs later today if possible-please advise

## 2011-07-30 NOTE — Telephone Encounter (Signed)
Pt informed

## 2011-08-08 ENCOUNTER — Encounter: Payer: Self-pay | Admitting: Endocrinology

## 2011-08-08 ENCOUNTER — Ambulatory Visit (INDEPENDENT_AMBULATORY_CARE_PROVIDER_SITE_OTHER): Payer: Medicare PPO | Admitting: Endocrinology

## 2011-08-08 VITALS — BP 110/74 | HR 61 | Temp 98.6°F | Ht 60.0 in | Wt 146.4 lb

## 2011-08-08 DIAGNOSIS — E042 Nontoxic multinodular goiter: Secondary | ICD-10-CM

## 2011-08-08 MED ORDER — LEVOTHYROXINE SODIUM 25 MCG PO TABS: 25.0000 ug | ORAL_TABLET | Freq: Every day | ORAL | Status: DC

## 2011-08-08 NOTE — Progress Notes (Signed)
Subjective:    Patient ID: Donna Andersen, female    DOB: Aug 22, 1941, 70 y.o.   MRN: [id number]  HPI Pt had a right thyroid lobectomy at [facility], approx 6 weeks ago.  pt states she feels well in general.  She takes no thyroid medication.   Past Medical History  Diagnosis Date  . Acute cystitis 09/26/2007  . COUGH DUE TO ACE INHIBITORS 06/20/2009  . 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 GOITER, MULTINODULAR 10/11/2009    Benign L thyroid nodule  . Headache 06/08/2008  . HEMATURIA UNSPECIFIED 10/05/2008  . HYPERGLYCEMIA 09/26/2007  . HYPERLIPIDEMIA 07/21/2007  . HYPERTENSION 11/22/2008  . HYPOTHYROIDISM 07/21/2007  . INSOMNIA 06/08/2008  . MYALGIA 10/23/2010  . OSTEOPOROSIS 07/21/2007  . WEIGHT GAIN 06/08/2008    Past Surgical History  Procedure Date  . Abdominal hysterectomy     History   Social History  . Marital Status: Married    Spouse Name: N/A    Number of Children: N/A  . Years of Education: N/A   Occupational History  . Homemaker    Social History Main Topics  . Smoking status: Never Smoker   . Smokeless tobacco: Not on file  . Alcohol Use: Not on file  . Drug Use: Not on file  . Sexually Active: Not on file   Other Topics Concern  . Not on file   Social History Narrative   Does not work outside the home    Current Outpatient Prescriptions on File Prior to Visit  Medication Sig Dispense Refill  . alendronate (FOSAMAX) 70 MG tablet Take 70 mg by mouth every 7 (seven) days. Take with a full glass of water on an empty stomach.       . calcium carbonate (OS-CAL - DOSED IN MG OF ELEMENTAL CALCIUM) 1250 MG tablet Take 1 tablet by mouth daily.        . cholecalciferol (VITAMIN D) 1000 UNITS tablet Take 1,000 Units by mouth daily.        . colestipol (COLESTID) 1 G tablet Take 3 g by mouth daily.        Marland Kitchen levothyroxine (SYNTHROID, LEVOTHROID) 25 MCG tablet Take 25 mcg by mouth daily.        Marland Kitchen olmesartan (BENICAR) 20 MG tablet Take 20 mg by mouth daily.        . Omega-3  Fatty Acids (FISH OIL) 435 MG CAPS Take 1 capsule by mouth daily. 400mg        . rosuvastatin (CRESTOR) 5 MG tablet Take 5 mg by mouth daily.        . traZODone (DESYREL) 100 MG tablet Take 100 mg by mouth at bedtime as needed. For sleep         Allergies  Allergen Reactions  . Atorvastatin     REACTION: dental pain, myalgias, and headache  . Lisinopril     REACTION: cough  . Losartan Potassium     REACTION: drowsiness  . Sulfonamide Derivatives     REACTION: Rash, Hives, Asthma    Family History  Problem Relation Age of Onset  . Cancer Mother     Ovarian Cancer  . Lymphoma Sister   . Cancer Sister     Breast Cancer, Kidney Cancer  . Leukemia Sister     BP 110/74  Pulse 61  Temp(Src) 98.6 F (37 C) (Oral)  Ht 5' (1.524 m)  Wt 146 lb 6.4 oz (66.407 kg)  BMI 28.59 kg/m2  SpO2 96%    Review of  Systems Denies weight change.    Objective:   Physical Exam VITAL SIGNS:  See vs page GENERAL: no distress Neck: a healed scar is present.  i do not appreciate a nodule in the thyroid or elsewhere in the neck  Lab Results  Component Value Date   TSH 1.78 07/30/2011  (i reviewed pathol)    Assessment & Plan:  Multinodular goiter, s/p right lobect.

## 2011-08-08 NOTE — Patient Instructions (Addendum)
i have sent a prescription to your pharmacy, to resume the thyroid medication.   Please come back for a regular physical appointment in 4 months.

## 2012-01-25 ENCOUNTER — Ambulatory Visit (INDEPENDENT_AMBULATORY_CARE_PROVIDER_SITE_OTHER)
Admission: RE | Admit: 2012-01-25 | Discharge: 2012-01-25 | Disposition: A | Discharge status: Home / Self Care | Payer: Medicare PPO | Source: Ambulatory Visit

## 2012-01-25 ENCOUNTER — Encounter: Payer: Self-pay | Admitting: Endocrinology

## 2012-01-25 ENCOUNTER — Other Ambulatory Visit (INDEPENDENT_AMBULATORY_CARE_PROVIDER_SITE_OTHER): Payer: Medicare PPO

## 2012-01-25 ENCOUNTER — Ambulatory Visit (INDEPENDENT_AMBULATORY_CARE_PROVIDER_SITE_OTHER): Payer: Medicare PPO | Admitting: Endocrinology

## 2012-01-25 VITALS — BP 122/68 | HR 66 | Temp 97.6°F | Ht 60.0 in | Wt 147.8 lb

## 2012-01-25 DIAGNOSIS — Z79899 Other long term (current) drug therapy: Secondary | ICD-10-CM

## 2012-01-25 DIAGNOSIS — I10 Essential (primary) hypertension: Secondary | ICD-10-CM

## 2012-01-25 DIAGNOSIS — E039 Hypothyroidism, unspecified: Secondary | ICD-10-CM

## 2012-01-25 DIAGNOSIS — E785 Hyperlipidemia, unspecified: Secondary | ICD-10-CM

## 2012-01-25 DIAGNOSIS — M810 Age-related osteoporosis without current pathological fracture: Secondary | ICD-10-CM

## 2012-01-25 DIAGNOSIS — E042 Nontoxic multinodular goiter: Secondary | ICD-10-CM

## 2012-01-25 DIAGNOSIS — R319 Hematuria, unspecified: Secondary | ICD-10-CM

## 2012-01-25 DIAGNOSIS — R7309 Other abnormal glucose: Secondary | ICD-10-CM

## 2012-01-25 LAB — CBC WITH DIFFERENTIAL/PLATELET
Basophils Relative: 0.6 % (ref 0.0–3.0)
Eosinophils Absolute: 0.1 10*3/uL (ref 0.0–0.7)
Eosinophils Relative: 1.3 % (ref 0.0–5.0)
HCT: 39 % (ref 36.0–46.0)
Lymphs Abs: 1.9 10*3/uL (ref 0.7–4.0)
MCHC: 34 g/dL (ref 30.0–36.0)
MCV: 92.3 fl (ref 78.0–100.0)
Monocytes Absolute: 0.6 10*3/uL (ref 0.1–1.0)
Neutro Abs: 7.1 10*3/uL (ref 1.4–7.7)
Neutrophils Relative %: 72.3 % (ref 43.0–77.0)
RBC: 4.23 Mil/uL (ref 3.87–5.11)

## 2012-01-25 LAB — LDL CHOLESTEROL, DIRECT: Direct LDL: 130.4 mg/dL

## 2012-01-25 LAB — HEPATIC FUNCTION PANEL
ALT: 21 U/L (ref 0–35)
Albumin: 4.4 g/dL (ref 3.5–5.2)
Bilirubin, Direct: 0.1 mg/dL (ref 0.0–0.3)
Total Protein: 7.3 g/dL (ref 6.0–8.3)

## 2012-01-25 LAB — BASIC METABOLIC PANEL
Chloride: 101 mEq/L (ref 96–112)
GFR: 85.4 mL/min (ref >60.00)
Potassium: 4.2 mEq/L (ref 3.5–5.1)
Sodium: 138 mEq/L (ref 135–145)

## 2012-01-25 LAB — LIPID PANEL
Cholesterol: 221 mg/dL — ABNORMAL HIGH (ref 0–200)
HDL: 65.4 mg/dL (ref >39.00)
Total CHOL/HDL Ratio: 3
VLDL: 34.4 mg/dL (ref 0.0–40.0)

## 2012-01-25 LAB — TSH: TSH: 1.21 u[IU]/mL (ref 0.35–5.50)

## 2012-01-25 LAB — URINALYSIS, ROUTINE W REFLEX MICROSCOPIC
Bilirubin Urine: NEGATIVE
Ketones, ur: NEGATIVE
Leukocytes, UA: NEGATIVE
Urine Glucose: NEGATIVE
Urobilinogen, UA: 0.2 (ref 0.0–1.0)

## 2012-01-25 MED ORDER — LEVOTHYROXINE SODIUM 25 MCG PO TABS: 25.0000 ug | ORAL_TABLET | Freq: Every day | ORAL | Status: DC

## 2012-01-25 MED ORDER — ROSUVASTATIN CALCIUM 5 MG PO TABS: 5.0000 mg | ORAL_TABLET | Freq: Every day | ORAL | Status: DC

## 2012-01-25 NOTE — Patient Instructions (Addendum)
blood tests are being requested for you today.  You will receive a letter with results. please consider these measures for your health:  minimize alcohol.  do not use tobacco products.  have a colonoscopy at least every 10 years from age 69.  Women should have an annual mammogram from age 8.  keep firearms safely stored.  always use seat belts.  have working smoke alarms in your home.  see an eye doctor and dentist regularly.  never drive under the influence of alcohol or drugs (including prescription drugs).  those with fair skin should take precautions against the sun.  please let me know what your wishes would be, if artificial life support measures should become necessary.  it is critically important to prevent falling down (keep floor areas well-lit, dry, and free of loose objects.  If you have a cane, walker, or wheelchair, you should use it, even for short trips around the house.  Also, try not to rush).  Please schedule a bone-density test.   (update: we discussed code status.  pt requests full code, but would not want to be started or maintained on artificial life-support measures if there was not a reasonable chance of recovery)

## 2012-01-25 NOTE — Progress Notes (Signed)
Subjective:    Patient ID: Donna Andersen, female    DOB: [birth year], 71 y.o.   MRN: [id number]  HPI here for regular wellness examination.  she's feeling pretty well in general, and says chronic med probs are stable, except as noted below.   Past Medical History  Diagnosis Date  . Acute cystitis 09/26/2007  . COUGH DUE TO ACE INHIBITORS 06/20/2009  . 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 GOITER, MULTINODULAR 10/11/2009    Benign L thyroid nodule  . Headache 06/08/2008  . HEMATURIA UNSPECIFIED 10/05/2008  . HYPERGLYCEMIA 09/26/2007  . HYPERLIPIDEMIA 07/21/2007  . HYPERTENSION 11/22/2008  . HYPOTHYROIDISM 07/21/2007  . INSOMNIA 06/08/2008  . MYALGIA 10/23/2010  . OSTEOPOROSIS 07/21/2007  . WEIGHT GAIN 06/08/2008    Past Surgical History  Procedure Date  . Abdominal hysterectomy     History   Social History  . Marital Status: Married    Spouse Name: N/A    Number of Children: N/A  . Years of Education: N/A   Occupational History  . Homemaker    Social History Main Topics  . Smoking status: Never Smoker   . Smokeless tobacco: Not on file  . Alcohol Use: Not on file  . Drug Use: Not on file  . Sexually Active: Not on file   Other Topics Concern  . Not on file   Social History Narrative   Does not work outside the home    Current Outpatient Prescriptions on File Prior to Visit  Medication Sig Dispense Refill  . alendronate (FOSAMAX) 70 MG tablet Take 70 mg by mouth every 7 (seven) days. Take with a full glass of water on an empty stomach.       . calcium carbonate (OS-CAL - DOSED IN MG OF ELEMENTAL CALCIUM) 1250 MG tablet Take 1 tablet by mouth daily.        . cholecalciferol (VITAMIN D) 1000 UNITS tablet Take 1,000 Units by mouth daily.        . colestipol (COLESTID) 1 G tablet Take 3 g by mouth daily.        Marland Kitchen levothyroxine (SYNTHROID, LEVOTHROID) 25 MCG tablet Take 1 tablet (25 mcg total) by mouth daily.  90 tablet  3  . olmesartan (BENICAR) 20 MG tablet Take 20 mg by  mouth daily.        . Omega-3 Fatty Acids (FISH OIL) 435 MG CAPS Take 1 capsule by mouth daily. 400mg        . rosuvastatin (CRESTOR) 5 MG tablet Take 5 mg by mouth daily.        . traZODone (DESYREL) 100 MG tablet Take 100 mg by mouth at bedtime as needed. For sleep         Allergies  Allergen Reactions  . Atorvastatin     REACTION: dental pain, myalgias, and headache  . Lisinopril     REACTION: cough  . Losartan Potassium     REACTION: drowsiness  . Sulfonamide Derivatives     REACTION: Rash, Hives, Asthma    Family History  Problem Relation Age of Onset  . Cancer Mother     Ovarian Cancer  . Lymphoma Sister   . Cancer Sister     Breast Cancer, Kidney Cancer  . Leukemia Sister     There were no vitals taken for this visit.  Review of Systems  Constitutional: Negative for fever and unexpected weight change.  HENT: Negative for hearing loss.   Eyes: Negative for visual disturbance.  Respiratory: Negative for  shortness of breath.   Cardiovascular: Negative for chest pain.  Gastrointestinal: Negative for anal bleeding.  Genitourinary: Negative for hematuria and vaginal bleeding.  Musculoskeletal: Negative for back pain.  Skin: Negative for rash.  Neurological: Negative for syncope, numbness and headaches.  Hematological: Does not bruise/bleed easily.  Psychiatric/Behavioral: Negative for dysphoric mood.      Objective:   Physical Exam VS: see vs page GEN: no distress HEAD: head: no deformity eyes: no periorbital swelling, no proptosis external nose and ears are normal mouth: no lesion seen Neck: a healed scar is present.  There is a 1 cm diameter left thyroid nodule.  i do not appreciate a nodule in the elsewhere in the neck CHEST WALL: no deformity LUNGS:  Clear to auscultation BREASTS:  sees gyn CV: reg rate and rhythm, no murmur ABD: abdomen is soft, nontender.  no hepatosplenomegaly.  not distended.  no hernia. GENITALIA/RECTAL: sees gyn.     MUSCULOSKELETAL: muscle bulk and strength are grossly normal.  no obvious joint swelling.  gait is normal and steady EXTEMITIES: no deformity.  no ulcer on the feet.  feet are of normal color and temp.  no edema.   PULSES: dorsalis pedis intact bilat.  no carotid bruit NEURO:  cn 2-12 grossly intact.   readily moves all 4's.  sensation is intact to touch on the feet SKIN:  Normal texture and temperature.  No rash or suspicious lesion is visible.   NODES:  None palpable at the neck PSYCH: alert, oriented x3.  Does not appear anxious nor depressed.    Assessment & Plan:  Wellness visit today, with problems stable, except as noted.

## 2012-01-26 ENCOUNTER — Encounter: Payer: Self-pay | Admitting: Endocrinology

## 2012-01-28 ENCOUNTER — Telehealth: Payer: Self-pay | Admitting: *Deleted

## 2012-01-28 LAB — PTH, INTACT AND CALCIUM
Calcium, Total (PTH): 9.5 mg/dL (ref 8.4–10.5)
PTH: 51.9 pg/mL (ref 14.0–72.0)

## 2012-01-28 NOTE — Telephone Encounter (Signed)
Called pt to inform of CPX results. Left message for pt to callback office. (Letter also mailed to pt)

## 2012-01-29 NOTE — Telephone Encounter (Signed)
Left message for pt to callback office.  

## 2012-01-30 NOTE — Telephone Encounter (Signed)
Left message for pt to callback office.  

## 2012-01-30 NOTE — Telephone Encounter (Signed)
Pt informed of lab results. 

## 2012-04-02 ENCOUNTER — Telehealth: Payer: Self-pay | Admitting: Endocrinology

## 2012-04-02 NOTE — Telephone Encounter (Signed)
Caller: [name]/Patient; PCP: Romero Belling; CB#: [phone];  Call regarding Cystitis; Reports dysuria, urgency and frequency. Onset 04/02/12.  Afebrile. Menopausal. Informed of MD policy that must be seen for antibiotics. Advised to see MD within 24 hrs for one or more urinary tract symptoms and has not been previously evaluated per Urinary Symptoms Guideline. No appts remain for 04/01/12 and no blank slots for 04/03/12. Advised to call office 04/03/12 AM for appt.  Stated will go to UC now.

## 2012-04-15 ENCOUNTER — Other Ambulatory Visit: Payer: Self-pay | Admitting: Dermatology

## 2012-08-15 ENCOUNTER — Ambulatory Visit (INDEPENDENT_AMBULATORY_CARE_PROVIDER_SITE_OTHER): Payer: Medicare PPO | Admitting: Endocrinology

## 2012-08-15 ENCOUNTER — Encounter: Payer: Self-pay | Admitting: Endocrinology

## 2012-08-15 VITALS — BP 122/72 | HR 63 | Temp 98.2°F | Wt 144.0 lb

## 2012-08-15 DIAGNOSIS — E042 Nontoxic multinodular goiter: Secondary | ICD-10-CM

## 2012-08-15 MED ORDER — OLMESARTAN MEDOXOMIL 20 MG PO TABS: 20.0000 mg | ORAL_TABLET | Freq: Every day | ORAL | Status: DC

## 2012-08-15 NOTE — Progress Notes (Signed)
Subjective:    Patient ID: Donna Andersen, female    DOB: [birth year], 71 y.o.   MRN: [id number]  HPI Pt returns for f/u of HTN.  takes and tolerates benicar well.   Past Medical History  Diagnosis Date  . Acute cystitis 09/26/2007  . COUGH DUE TO ACE INHIBITORS 06/20/2009  . 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 GOITER, MULTINODULAR 10/11/2009    Benign L thyroid nodule  . Headache 06/08/2008  . HEMATURIA UNSPECIFIED 10/05/2008  . HYPERGLYCEMIA 09/26/2007  . HYPERLIPIDEMIA 07/21/2007  . HYPERTENSION 11/22/2008  . HYPOTHYROIDISM 07/21/2007  . INSOMNIA 06/08/2008  . MYALGIA 10/23/2010  . OSTEOPOROSIS 07/21/2007  . WEIGHT GAIN 06/08/2008    Past Surgical History  Procedure Date  . Abdominal hysterectomy     History   Social History  . Marital Status: Married    Spouse Name: N/A    Number of Children: N/A  . Years of Education: N/A   Occupational History  . Homemaker    Social History Main Topics  . Smoking status: Never Smoker   . Smokeless tobacco: Not on file  . Alcohol Use: Not on file  . Drug Use: Not on file  . Sexually Active: Not on file   Other Topics Concern  . Not on file   Social History Narrative   Does not work outside the home    Current Outpatient Prescriptions on File Prior to Visit  Medication Sig Dispense Refill  . alendronate (FOSAMAX) 70 MG tablet Take 70 mg by mouth every 7 (seven) days. Take with a full glass of water on an empty stomach.       . calcium carbonate (OS-CAL - DOSED IN MG OF ELEMENTAL CALCIUM) 1250 MG tablet Take 1 tablet by mouth daily.        . cholecalciferol (VITAMIN D) 1000 UNITS tablet Take 1,000 Units by mouth daily.        . colestipol (COLESTID) 1 G tablet Take 3 g by mouth daily.        Marland Kitchen levothyroxine (SYNTHROID, LEVOTHROID) 25 MCG tablet Take 1 tablet (25 mcg total) by mouth daily.  90 tablet  3  . olmesartan (BENICAR) 20 MG tablet Take 1 tablet (20 mg total) by mouth daily.  90 tablet  3  . Omega-3 Fatty Acids (FISH  OIL) 435 MG CAPS Take 1 capsule by mouth daily. 400mg        . rosuvastatin (CRESTOR) 5 MG tablet Take 1 tablet (5 mg total) by mouth daily.  90 tablet  3  . traZODone (DESYREL) 100 MG tablet Take 100 mg by mouth at bedtime as needed. For sleep         Allergies  Allergen Reactions  . Atorvastatin     REACTION: dental pain, myalgias, and headache  . Lisinopril     REACTION: cough  . Losartan Potassium     REACTION: drowsiness  . Sulfonamide Derivatives     REACTION: Rash, Hives, Asthma    Family History  Problem Relation Age of Onset  . Cancer Mother     Ovarian Cancer  . Lymphoma Sister   . Cancer Sister     Breast Cancer, Kidney Cancer  . Leukemia Sister     BP 122/72  Pulse 63  Temp 98.2 F (36.8 C) (Oral)  Wt 144 lb (65.318 kg)    Review of Systems Denies dizziness and LOC    Objective:   Physical Exam Neck: a healed scar is present.  There is  a 1 cm diameter left thyroid nodule.  i do not appreciate a nodule in the elsewhere in the neck LUNGS:  Clear to auscultation HEART:  Regular rate and rhythm without murmurs noted. Normal S1,S2.       Assessment & Plan:  HTN, well-controlled

## 2012-08-15 NOTE — Patient Instructions (Addendum)
Please continue the same medications Please come back for a regular physical any day after 01/24/13.

## 2012-08-22 ENCOUNTER — Encounter: Payer: Self-pay | Admitting: Endocrinology

## 2013-01-26 ENCOUNTER — Ambulatory Visit (INDEPENDENT_AMBULATORY_CARE_PROVIDER_SITE_OTHER): Payer: Medicare PPO | Admitting: Endocrinology

## 2013-01-26 VITALS — BP 122/78 | HR 71 | Wt 149.0 lb

## 2013-01-26 DIAGNOSIS — Z79899 Other long term (current) drug therapy: Secondary | ICD-10-CM

## 2013-01-26 DIAGNOSIS — I10 Essential (primary) hypertension: Secondary | ICD-10-CM

## 2013-01-26 DIAGNOSIS — E039 Hypothyroidism, unspecified: Secondary | ICD-10-CM

## 2013-01-26 DIAGNOSIS — M810 Age-related osteoporosis without current pathological fracture: Secondary | ICD-10-CM

## 2013-01-26 DIAGNOSIS — E785 Hyperlipidemia, unspecified: Secondary | ICD-10-CM

## 2013-01-26 DIAGNOSIS — R7309 Other abnormal glucose: Secondary | ICD-10-CM

## 2013-01-26 DIAGNOSIS — N3000 Acute cystitis without hematuria: Secondary | ICD-10-CM

## 2013-01-26 DIAGNOSIS — R319 Hematuria, unspecified: Secondary | ICD-10-CM

## 2013-01-26 DIAGNOSIS — E890 Postprocedural hypothyroidism: Secondary | ICD-10-CM | POA: Insufficient documentation

## 2013-01-26 LAB — BASIC METABOLIC PANEL
BUN: 17 mg/dL (ref 6–23)
CO2: 26 mEq/L (ref 19–32)
Calcium: 8.9 mg/dL (ref 8.4–10.5)
GFR: 90.96 mL/min (ref >60.00)
Glucose, Bld: 94 mg/dL (ref 70–99)

## 2013-01-26 LAB — URINALYSIS, ROUTINE W REFLEX MICROSCOPIC
Bilirubin Urine: NEGATIVE
Ketones, ur: NEGATIVE
Nitrite: NEGATIVE
Total Protein, Urine: NEGATIVE
Urine Glucose: NEGATIVE
pH: 6 (ref 5.0–8.0)

## 2013-01-26 LAB — CBC WITH DIFFERENTIAL/PLATELET
Basophils Relative: 1 % (ref 0.0–3.0)
Eosinophils Absolute: 0.2 10*3/uL (ref 0.0–0.7)
Eosinophils Relative: 2 % (ref 0.0–5.0)
Hemoglobin: 13.6 g/dL (ref 12.0–15.0)
Lymphocytes Relative: 23.7 % (ref 12.0–46.0)
MCHC: 34.6 g/dL (ref 30.0–36.0)
MCV: 91.8 fl (ref 78.0–100.0)
Monocytes Absolute: 0.5 10*3/uL (ref 0.1–1.0)
Neutro Abs: 5.2 10*3/uL (ref 1.4–7.7)
Neutrophils Relative %: 67.3 % (ref 43.0–77.0)
RBC: 4.28 Mil/uL (ref 3.87–5.11)
WBC: 7.7 10*3/uL (ref 4.5–10.5)

## 2013-01-26 LAB — HEPATIC FUNCTION PANEL
ALT: 22 U/L (ref 0–35)
AST: 18 U/L (ref 0–37)
Alkaline Phosphatase: 63 U/L (ref 39–117)
Total Bilirubin: 0.5 mg/dL (ref 0.3–1.2)

## 2013-01-26 LAB — LDL CHOLESTEROL, DIRECT: Direct LDL: 145.4 mg/dL

## 2013-01-26 NOTE — Patient Instructions (Addendum)
please consider these measures for your health:  minimize alcohol.  do not use tobacco products.  have a colonoscopy at least every 10 years from age 70.  Women should have an annual mammogram from age 40.  keep firearms safely stored.  always use seat belts.  have working smoke alarms in your home.  see an eye doctor and dentist regularly.  never drive under the influence of alcohol or drugs (including prescription drugs).  those with fair skin should take precautions against the sun. it is critically important to prevent falling down (keep floor areas well-lit, dry, and free of loose objects.  If you have a cane, walker, or wheelchair, you should use it, even for short trips around the house.  Also, try not to rush). blood tests are being requested for you today.  We'll contact you with results. Please return in 1 year.  

## 2013-01-26 NOTE — Progress Notes (Signed)
Subjective:    Patient ID: Donna Andersen, female    DOB: December 06, 1940, 72 y.o.   MRN: [id number]  HPI here for regular wellness examination.  she's feeling pretty well in general, and says chronic med probs are stable, except as noted below Past Medical History  Diagnosis Date  . Acute cystitis 09/26/2007  . COUGH DUE TO ACE INHIBITORS 06/20/2009  . 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 GOITER, MULTINODULAR 10/11/2009    Benign L thyroid nodule  . Headache 06/08/2008  . HEMATURIA UNSPECIFIED 10/05/2008  . HYPERGLYCEMIA 09/26/2007  . HYPERLIPIDEMIA 07/21/2007  . HYPERTENSION 11/22/2008  . HYPOTHYROIDISM 07/21/2007  . INSOMNIA 06/08/2008  . MYALGIA 10/23/2010  . OSTEOPOROSIS 07/21/2007  . WEIGHT GAIN 06/08/2008    Past Surgical History  Procedure Laterality Date  . Abdominal hysterectomy      History   Social History  . Marital Status: Married    Spouse Name: N/A    Number of Children: N/A  . Years of Education: N/A   Occupational History  . Homemaker    Social History Main Topics  . Smoking status: Never Smoker   . Smokeless tobacco: Not on file  . Alcohol Use: Not on file  . Drug Use: Not on file  . Sexually Active: Not on file   Other Topics Concern  . Not on file   Social History Narrative   Does not work outside the home    Current Outpatient Prescriptions on File Prior to Visit  Medication Sig Dispense Refill  . calcium carbonate (OS-CAL - DOSED IN MG OF ELEMENTAL CALCIUM) 1250 MG tablet Take 1 tablet by mouth daily.        . cholecalciferol (VITAMIN D) 1000 UNITS tablet Take 1,000 Units by mouth daily.        . colestipol (COLESTID) 1 G tablet Take 3 g by mouth daily.        Marland Kitchen levothyroxine (SYNTHROID, LEVOTHROID) 25 MCG tablet Take 1 tablet (25 mcg total) by mouth daily.  90 tablet  3  . olmesartan (BENICAR) 20 MG tablet Take 1 tablet (20 mg total) by mouth daily.  90 tablet  3  . Omega-3 Fatty Acids (FISH OIL) 435 MG CAPS Take 1 capsule by mouth daily. 400mg         . rosuvastatin (CRESTOR) 5 MG tablet Take 1 tablet (5 mg total) by mouth daily.  90 tablet  3  . traZODone (DESYREL) 100 MG tablet Take 100 mg by mouth at bedtime as needed. For sleep        No current facility-administered medications on file prior to visit.    Allergies  Allergen Reactions  . Atorvastatin     REACTION: dental pain, myalgias, and headache  . Lisinopril     REACTION: cough  . Losartan Potassium     REACTION: drowsiness  . Sulfonamide Derivatives     REACTION: Rash, Hives, Asthma    Family History  Problem Relation Age of Onset  . Cancer Mother     Ovarian Cancer  . Lymphoma Sister   . Cancer Sister     Breast Cancer, Kidney Cancer  . Leukemia Sister     BP 122/78  Pulse 71  Wt 149 lb (67.586 kg)  BMI 29.1 kg/m2  SpO2 99%   Review of Systems  Constitutional: Negative for fever and unexpected weight change.  HENT: Negative for hearing loss.   Eyes: Negative for visual disturbance.  Respiratory: Negative for shortness of breath.   Cardiovascular:  Negative for chest pain.  Gastrointestinal: Negative for anal bleeding.  Endocrine: Negative for polyuria.  Genitourinary: Negative for hematuria.  Musculoskeletal: Negative for back pain.  Skin: Negative for rash.  Allergic/Immunologic: Positive for environmental allergies.  Neurological: Negative for syncope, numbness and headaches.  Hematological: Does not bruise/bleed easily.  Psychiatric/Behavioral: Negative for dysphoric mood.      Objective:   Physical Exam VS: see vs page GEN: no distress HEAD: head: no deformity eyes: no periorbital swelling, no proptosis external nose and ears are normal mouth: no lesion seen NECK: a healed scar is present.  i do not appreciate a nodule in the thyroid or elsewhere in the neck CHEST WALL: no deformity LUNGS:  Clear to auscultation BREASTS:  sees gyn CV: reg rate and rhythm, no murmur ABD: abdomen is soft, nontender.  no hepatosplenomegaly.  not  distended.  no hernia GENITALIA/RECTAL: sees gyn MUSCULOSKELETAL: muscle bulk and strength are grossly normal.  no obvious joint swelling.  gait is normal and steady EXTEMITIES: no deformity.  no ulcer on the feet.  feet are of normal color and temp.  no edema PULSES: dorsalis pedis intact bilat.  no carotid bruit NEURO:  cn 2-12 grossly intact.   readily moves all 4's.  sensation is intact to touch on the feet SKIN:  Normal texture and temperature.  No rash or suspicious lesion is visible.   NODES:  None palpable at the neck PSYCH: alert, oriented x3.  Does not appear anxious nor depressed.   (ecg machine is broken)    Assessment & Plan:  Wellness visit today, with problems stable, except as noted.  we discussed code status.  pt requests full code, but would not want to be started or maintained on artificial life-support measures if there was not a reasonable chance of recovery

## 2013-01-27 ENCOUNTER — Encounter: Payer: Self-pay | Admitting: Endocrinology

## 2013-01-27 LAB — PTH, INTACT AND CALCIUM: Calcium, Total (PTH): 9.4 mg/dL (ref 8.4–10.5)

## 2013-08-07 ENCOUNTER — Other Ambulatory Visit: Payer: Self-pay | Admitting: *Deleted

## 2013-08-07 MED ORDER — LEVOTHYROXINE SODIUM 25 MCG PO TABS: 25.0000 ug | ORAL_TABLET | Freq: Every day | ORAL | Status: DC

## 2013-09-10 ENCOUNTER — Other Ambulatory Visit: Payer: Self-pay

## 2013-12-01 ENCOUNTER — Encounter: Payer: Self-pay | Admitting: Gastroenterology

## 2014-01-18 ENCOUNTER — Other Ambulatory Visit: Payer: Self-pay | Admitting: Endocrinology

## 2014-01-18 ENCOUNTER — Encounter: Payer: Self-pay | Admitting: Endocrinology

## 2014-01-18 MED ORDER — OLMESARTAN MEDOXOMIL 20 MG PO TABS: 20.0000 mg | ORAL_TABLET | Freq: Every day | ORAL | Status: DC

## 2014-02-01 ENCOUNTER — Encounter: Payer: Medicare PPO | Admitting: Endocrinology

## 2014-02-16 ENCOUNTER — Ambulatory Visit (INDEPENDENT_AMBULATORY_CARE_PROVIDER_SITE_OTHER): Payer: Medicare PPO | Admitting: Endocrinology

## 2014-02-16 ENCOUNTER — Encounter: Payer: Self-pay | Admitting: Endocrinology

## 2014-02-16 VITALS — BP 114/80 | HR 66 | Temp 97.8°F | Ht 60.0 in | Wt 147.0 lb

## 2014-02-16 DIAGNOSIS — M810 Age-related osteoporosis without current pathological fracture: Secondary | ICD-10-CM

## 2014-02-16 DIAGNOSIS — E890 Postprocedural hypothyroidism: Secondary | ICD-10-CM

## 2014-02-16 DIAGNOSIS — Z79899 Other long term (current) drug therapy: Secondary | ICD-10-CM

## 2014-02-16 DIAGNOSIS — E785 Hyperlipidemia, unspecified: Secondary | ICD-10-CM

## 2014-02-16 DIAGNOSIS — Z0000 Encounter for general adult medical examination without abnormal findings: Secondary | ICD-10-CM | POA: Insufficient documentation

## 2014-02-16 DIAGNOSIS — E042 Nontoxic multinodular goiter: Secondary | ICD-10-CM

## 2014-02-16 DIAGNOSIS — R7309 Other abnormal glucose: Secondary | ICD-10-CM

## 2014-02-16 DIAGNOSIS — I10 Essential (primary) hypertension: Secondary | ICD-10-CM

## 2014-02-16 DIAGNOSIS — R319 Hematuria, unspecified: Secondary | ICD-10-CM

## 2014-02-16 NOTE — Patient Instructions (Addendum)
Please do a bone-density test at the old office. blood tests are being requested for you today.  We'll contact you with results. please consider these measures for your health:  minimize alcohol.  do not use tobacco products.  have a colonoscopy at least every 10 years from age 71.  Women should have an annual mammogram from age 81.  keep firearms safely stored.  always use seat belts.  have working smoke alarms in your home.  see an eye doctor and dentist regularly.  never drive under the influence of alcohol or drugs (including prescription drugs).  those with fair skin should take precautions against the sun.  it is critically important to prevent falling down (keep floor areas well-lit, dry, and free of loose objects.  If you have a cane, walker, or wheelchair, you should use it, even for short trips around the house.  Also, try not to rush).   Please return in 1 year.  You should have a vaccine against shingles (a painful rash which results from the  chickenpox infection which most people had many years ago).  This vaccine reduces, but does not totally eliminate the risk of shingles.  Because this is a medicare part d benefit, you should get it at a pharmacy.

## 2014-02-16 NOTE — Progress Notes (Signed)
Subjective:    Patient ID: Donna Andersen, female    DOB: May 15, 1941, 73 y.o.   MRN: [id number]  HPI Pt is here for regular wellness examination, and is feeling pretty well in general, and says chronic med probs are stable, except as noted below.  She does not take the colestipol.   Past Medical History  Diagnosis Date  . Acute cystitis 09/26/2007  . COUGH DUE TO ACE INHIBITORS 06/20/2009  . 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 GOITER, MULTINODULAR 10/11/2009    Benign L thyroid nodule  . Headache(784.0) 06/08/2008  . HEMATURIA UNSPECIFIED 10/05/2008  . HYPERGLYCEMIA 09/26/2007  . HYPERLIPIDEMIA 07/21/2007  . HYPERTENSION 11/22/2008  . HYPOTHYROIDISM 07/21/2007  . INSOMNIA 06/08/2008  . MYALGIA 10/23/2010  . OSTEOPOROSIS 07/21/2007  . WEIGHT GAIN 06/08/2008    Past Surgical History  Procedure Laterality Date  . Abdominal hysterectomy      History   Social History  . Marital Status: Married    Spouse Name: N/A    Number of Children: N/A  . Years of Education: N/A   Occupational History  . Homemaker    Social History Main Topics  . Smoking status: Never Smoker   . Smokeless tobacco: Not on file  . Alcohol Use: Not on file  . Drug Use: Not on file  . Sexual Activity: Not on file   Other Topics Concern  . Not on file   Social History Narrative   Does not work outside the home    Current Outpatient Prescriptions on File Prior to Visit  Medication Sig Dispense Refill  . calcium carbonate (OS-CAL - DOSED IN MG OF ELEMENTAL CALCIUM) 1250 MG tablet Take 1 tablet by mouth daily.        . cholecalciferol (VITAMIN D) 1000 UNITS tablet Take 1,000 Units by mouth daily.        . colestipol (COLESTID) 1 G tablet Take 3 g by mouth daily.        Marland Kitchen levothyroxine (SYNTHROID, LEVOTHROID) 25 MCG tablet Take 1 tablet (25 mcg total) by mouth daily.  90 tablet  2  . olmesartan (BENICAR) 20 MG tablet Take 1 tablet (20 mg total) by mouth daily.  90 tablet  3  . Omega-3 Fatty Acids (FISH  OIL) 435 MG CAPS Take 1 capsule by mouth daily. 400mg        . rosuvastatin (CRESTOR) 5 MG tablet Take 1 tablet (5 mg total) by mouth daily.  90 tablet  3   No current facility-administered medications on file prior to visit.    Allergies  Allergen Reactions  . Atorvastatin     REACTION: dental pain, myalgias, and headache  . Lisinopril     REACTION: cough  . Losartan Potassium     REACTION: drowsiness  . Sulfonamide Derivatives     REACTION: Rash, Hives, Asthma    Family History  Problem Relation Age of Onset  . Cancer Mother     Ovarian Cancer  . Lymphoma Sister   . Cancer Sister     Breast Cancer, Kidney Cancer  . Leukemia Sister     BP 114/80  Pulse 66  Temp(Src) 97.8 F (36.6 C) (Oral)  Ht 5' (1.524 m)  Wt 147 lb (66.679 kg)  BMI 28.71 kg/m2  SpO2 96%  Review of Systems  Constitutional: Negative for fever and unexpected weight change.  HENT: Negative for hearing loss.   Eyes: Negative for visual disturbance.  Respiratory: Negative for shortness of breath.   Cardiovascular:  Negative for chest pain.  Gastrointestinal: Negative for anal bleeding.  Endocrine: Negative for cold intolerance.  Genitourinary: Negative for hematuria.  Musculoskeletal: Negative for back pain.  Skin: Negative for rash.  Allergic/Immunologic: Positive for environmental allergies.  Neurological: Negative for syncope and numbness.  Hematological: Does not bruise/bleed easily.  Psychiatric/Behavioral: Negative for dysphoric mood.       Objective:   Physical Exam VS: see vs page GEN: no distress HEAD: head: no deformity eyes: no periorbital swelling, no proptosis external nose and ears are normal mouth: no lesion seen NECK: a healed scar is present.  i do not appreciate a nodule in the thyroid or elsewhere in the neck CHEST WALL: no deformity LUNGS:  Clear to auscultation BREASTS: sees gyn CV: reg rate and rhythm, no murmur ABD: abdomen is soft, nontender.  no  hepatosplenomegaly.  not distended.  no hernia GENITALIA/RECTAL: sees gyn MUSCULOSKELETAL: muscle bulk and strength are grossly normal.  no obvious joint swelling.  gait is normal and steady EXTEMITIES: no deformity.  no ulcer on the feet.  feet are of normal color and temp.  no edema PULSES: dorsalis pedis intact bilat.  no carotid bruit NEURO:  cn 2-12 grossly intact.   readily moves all 4's.  sensation is intact to touch on the feet SKIN:  Normal texture and temperature.  No rash or suspicious lesion is visible.   NODES:  None palpable at the neck PSYCH: alert, well-oriented.  Does not appear anxious nor depressed.    i reviewed electrocardiogram: no change    Assessment & Plan:  Wellness visit today, with problems stable, except as noted. we discussed code status.  pt requests full code, but would not want to be started or maintained on artificial life-support measures if there was not a reasonable chance of recovery.

## 2014-02-17 ENCOUNTER — Encounter: Payer: Self-pay | Admitting: Endocrinology

## 2014-02-17 LAB — HEPATIC FUNCTION PANEL
ALBUMIN: 4.2 g/dL (ref 3.5–5.2)
ALT: 21 U/L (ref 0–35)
AST: 22 U/L (ref 0–37)
Alkaline Phosphatase: 71 U/L (ref 39–117)
Bilirubin, Direct: 0.1 mg/dL (ref 0.0–0.3)
Total Bilirubin: 0.5 mg/dL (ref 0.3–1.2)
Total Protein: 7.1 g/dL (ref 6.0–8.3)

## 2014-02-17 LAB — URINALYSIS, ROUTINE W REFLEX MICROSCOPIC
BILIRUBIN URINE: NEGATIVE
KETONES UR: NEGATIVE
Leukocytes, UA: NEGATIVE
NITRITE: NEGATIVE
PH: 6 (ref 5.0–8.0)
Specific Gravity, Urine: 1.01 (ref 1.000–1.030)
Total Protein, Urine: NEGATIVE
Urine Glucose: NEGATIVE
Urobilinogen, UA: 0.2 (ref 0.0–1.0)

## 2014-02-17 LAB — LIPID PANEL
Cholesterol: 216 mg/dL — ABNORMAL HIGH (ref 0–200)
HDL: 57.1 mg/dL (ref >39.00)
LDL Cholesterol: 121 mg/dL — ABNORMAL HIGH (ref 0–99)
TRIGLYCERIDES: 188 mg/dL — AB (ref 0.0–149.0)
Total CHOL/HDL Ratio: 4
VLDL: 37.6 mg/dL (ref 0.0–40.0)

## 2014-02-17 LAB — BASIC METABOLIC PANEL
BUN: 13 mg/dL (ref 6–23)
CALCIUM: 9.2 mg/dL (ref 8.4–10.5)
CO2: 27 meq/L (ref 19–32)
CREATININE: 0.7 mg/dL (ref 0.4–1.2)
Chloride: 103 mEq/L (ref 96–112)
GFR: 86.27 mL/min (ref >60.00)
Glucose, Bld: 87 mg/dL (ref 70–99)
Potassium: 4 mEq/L (ref 3.5–5.1)
SODIUM: 138 meq/L (ref 135–145)

## 2014-02-17 LAB — CBC WITH DIFFERENTIAL/PLATELET
BASOS PCT: 0.4 % (ref 0.0–3.0)
Basophils Absolute: 0 10*3/uL (ref 0.0–0.1)
EOS PCT: 1.7 % (ref 0.0–5.0)
Eosinophils Absolute: 0.2 10*3/uL (ref 0.0–0.7)
HCT: 40.6 % (ref 36.0–46.0)
Hemoglobin: 13.7 g/dL (ref 12.0–15.0)
LYMPHS PCT: 24.7 % (ref 12.0–46.0)
Lymphs Abs: 2.7 10*3/uL (ref 0.7–4.0)
MCHC: 33.7 g/dL (ref 30.0–36.0)
MCV: 91.5 fl (ref 78.0–100.0)
MONOS PCT: 5.1 % (ref 3.0–12.0)
Monocytes Absolute: 0.6 10*3/uL (ref 0.1–1.0)
NEUTROS PCT: 68.1 % (ref 43.0–77.0)
Neutro Abs: 7.4 10*3/uL (ref 1.4–7.7)
Platelets: 274 10*3/uL (ref 150.0–400.0)
RBC: 4.44 Mil/uL (ref 3.87–5.11)
RDW: 12.3 % (ref 11.5–14.6)
WBC: 10.9 10*3/uL — AB (ref 4.5–10.5)

## 2014-02-17 LAB — HEMOGLOBIN A1C: Hgb A1c MFr Bld: 5.3 % (ref 4.6–6.5)

## 2014-02-17 LAB — TSH: TSH: 1.88 u[IU]/mL (ref 0.35–5.50)

## 2014-02-17 LAB — MICROALBUMIN / CREATININE URINE RATIO
Creatinine,U: 57.3 mg/dL
MICROALB UR: 0.2 mg/dL (ref 0.0–1.9)
Microalb Creat Ratio: 0.3 mg/g (ref 0.0–30.0)

## 2014-02-18 ENCOUNTER — Other Ambulatory Visit: Payer: Self-pay | Admitting: Endocrinology

## 2014-02-18 MED ORDER — LEVOTHYROXINE SODIUM 25 MCG PO TABS: 25.0000 ug | ORAL_TABLET | Freq: Every day | ORAL | Status: DC

## 2014-02-22 ENCOUNTER — Other Ambulatory Visit: Payer: Medicare PPO

## 2014-02-25 ENCOUNTER — Ambulatory Visit (INDEPENDENT_AMBULATORY_CARE_PROVIDER_SITE_OTHER)
Admission: RE | Admit: 2014-02-25 | Discharge: 2014-02-25 | Disposition: A | Discharge status: Home / Self Care | Payer: Medicare PPO | Source: Ambulatory Visit | Attending: Endocrinology | Admitting: Endocrinology

## 2014-02-25 ENCOUNTER — Telehealth: Payer: Self-pay | Admitting: *Deleted

## 2014-02-25 DIAGNOSIS — M810 Age-related osteoporosis without current pathological fracture: Secondary | ICD-10-CM

## 2014-02-25 MED ORDER — SYNTHROID 25 MCG PO TABS: 25.0000 ug | ORAL_TABLET | Freq: Every day | ORAL | Status: DC

## 2014-02-25 NOTE — Addendum Note (Signed)
Addended by: Renato Shin on: 02/25/2014 04:14 PM   Modules accepted: Orders

## 2014-02-25 NOTE — Telephone Encounter (Signed)
done

## 2014-02-25 NOTE — Telephone Encounter (Signed)
MyChart message from pt: I recieved thyroxine. [facility] said to pls send a new RX for synthroid and write on it DISPENSE AS WRITTEN. THANKS SO MUCH Janean C. New rx for Synthroid sent to pt.

## 2014-02-26 ENCOUNTER — Other Ambulatory Visit: Payer: Self-pay | Admitting: *Deleted

## 2014-02-26 MED ORDER — SYNTHROID 25 MCG PO TABS: 25.0000 ug | ORAL_TABLET | Freq: Every day | ORAL | Status: DC

## 2014-02-26 NOTE — Telephone Encounter (Signed)
Rx did not go through for Synthyroid.

## 2014-02-26 NOTE — Telephone Encounter (Signed)
Error in sending. Printing to fax to [employer].

## 2014-03-08 ENCOUNTER — Other Ambulatory Visit: Payer: Medicare PPO

## 2014-07-31 ENCOUNTER — Encounter: Payer: Self-pay | Admitting: Gastroenterology

## 2014-08-25 ENCOUNTER — Other Ambulatory Visit: Payer: Self-pay | Admitting: Physician Assistant

## 2014-08-25 DIAGNOSIS — C4491 Basal cell carcinoma of skin, unspecified: Secondary | ICD-10-CM

## 2014-08-25 HISTORY — DX: Basal cell carcinoma of skin, unspecified: C44.91

## 2014-09-23 ENCOUNTER — Other Ambulatory Visit: Payer: Self-pay | Admitting: Physician Assistant

## 2015-01-18 ENCOUNTER — Encounter: Payer: Self-pay | Admitting: Gastroenterology

## 2015-02-16 ENCOUNTER — Ambulatory Visit (INDEPENDENT_AMBULATORY_CARE_PROVIDER_SITE_OTHER): Payer: Medicare PPO | Admitting: Endocrinology

## 2015-02-16 ENCOUNTER — Encounter: Payer: Self-pay | Admitting: Endocrinology

## 2015-02-16 VITALS — BP 128/86 | HR 76 | Temp 98.0°F | Ht 60.0 in | Wt 147.0 lb

## 2015-02-16 DIAGNOSIS — Z0000 Encounter for general adult medical examination without abnormal findings: Secondary | ICD-10-CM

## 2015-02-16 DIAGNOSIS — Z23 Encounter for immunization: Secondary | ICD-10-CM | POA: Diagnosis not present

## 2015-02-16 DIAGNOSIS — R739 Hyperglycemia, unspecified: Secondary | ICD-10-CM

## 2015-02-16 DIAGNOSIS — E785 Hyperlipidemia, unspecified: Secondary | ICD-10-CM

## 2015-02-16 DIAGNOSIS — I10 Essential (primary) hypertension: Secondary | ICD-10-CM | POA: Diagnosis not present

## 2015-02-16 DIAGNOSIS — E890 Postprocedural hypothyroidism: Secondary | ICD-10-CM | POA: Diagnosis not present

## 2015-02-16 LAB — CBC WITH DIFFERENTIAL/PLATELET
Basophils Absolute: 0.1 10*3/uL (ref 0.0–0.1)
Basophils Relative: 0.8 % (ref 0.0–3.0)
Eosinophils Absolute: 0.2 10*3/uL (ref 0.0–0.7)
Eosinophils Relative: 1.6 % (ref 0.0–5.0)
HCT: 41 % (ref 36.0–46.0)
HEMOGLOBIN: 13.9 g/dL (ref 12.0–15.0)
Lymphocytes Relative: 22.6 % (ref 12.0–46.0)
Lymphs Abs: 2.6 10*3/uL (ref 0.7–4.0)
MCHC: 34 g/dL (ref 30.0–36.0)
MCV: 88.6 fl (ref 78.0–100.0)
MONOS PCT: 6.4 % (ref 3.0–12.0)
Monocytes Absolute: 0.7 10*3/uL (ref 0.1–1.0)
NEUTROS ABS: 7.8 10*3/uL — AB (ref 1.4–7.7)
Neutrophils Relative %: 68.6 % (ref 43.0–77.0)
Platelets: 341 10*3/uL (ref 150.0–400.0)
RBC: 4.62 Mil/uL (ref 3.87–5.11)
RDW: 13.1 % (ref 11.5–15.5)
WBC: 11.4 10*3/uL — AB (ref 4.0–10.5)

## 2015-02-16 LAB — HEPATIC FUNCTION PANEL
ALK PHOS: 82 U/L (ref 39–117)
ALT: 15 U/L (ref 0–35)
AST: 14 U/L (ref 0–37)
Albumin: 4.3 g/dL (ref 3.5–5.2)
BILIRUBIN TOTAL: 0.3 mg/dL (ref 0.2–1.2)
Bilirubin, Direct: 0 mg/dL (ref 0.0–0.3)
Total Protein: 7.2 g/dL (ref 6.0–8.3)

## 2015-02-16 LAB — LIPID PANEL
CHOL/HDL RATIO: 4
Cholesterol: 196 mg/dL (ref 0–200)
HDL: 55 mg/dL (ref >39.00)
NonHDL: 141
Triglycerides: 201 mg/dL — ABNORMAL HIGH (ref 0.0–149.0)
VLDL: 40.2 mg/dL — AB (ref 0.0–40.0)

## 2015-02-16 LAB — BASIC METABOLIC PANEL
BUN: 16 mg/dL (ref 6–23)
CALCIUM: 9.7 mg/dL (ref 8.4–10.5)
CHLORIDE: 105 meq/L (ref 96–112)
CO2: 25 mEq/L (ref 19–32)
CREATININE: 0.77 mg/dL (ref 0.40–1.20)
GFR: 78.34 mL/min (ref >60.00)
Glucose, Bld: 98 mg/dL (ref 70–99)
Potassium: 4.5 mEq/L (ref 3.5–5.1)
Sodium: 139 mEq/L (ref 135–145)

## 2015-02-16 LAB — HEMOGLOBIN A1C: HEMOGLOBIN A1C: 5.7 % (ref 4.6–6.5)

## 2015-02-16 LAB — TSH: TSH: 1.77 u[IU]/mL (ref 0.35–4.50)

## 2015-02-16 LAB — LDL CHOLESTEROL, DIRECT: Direct LDL: 109 mg/dL

## 2015-02-16 NOTE — Progress Notes (Signed)
Subjective:    Patient ID: Donna Andersen, female    DOB: 27-Apr-1941, 74 y.o.   MRN: [id number]  HPI  The state of at least three ongoing medical problems is addressed today, with interval history of each noted here: HTN: denies sob.  Dyslipidemia: she denies chest pain.  Hypothyroidism: she denies weight change.  Past Medical History  Diagnosis Date  . Acute cystitis 09/26/2007  . COUGH DUE TO ACE INHIBITORS 06/20/2009  . 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 GOITER, MULTINODULAR 10/11/2009    Benign L thyroid nodule  . Headache(784.0) 06/08/2008  . HEMATURIA UNSPECIFIED 10/05/2008  . HYPERGLYCEMIA 09/26/2007  . HYPERLIPIDEMIA 07/21/2007  . HYPERTENSION 11/22/2008  . HYPOTHYROIDISM 07/21/2007  . INSOMNIA 06/08/2008  . MYALGIA 10/23/2010  . OSTEOPOROSIS 07/21/2007  . WEIGHT GAIN 06/08/2008    Past Surgical History  Procedure Laterality Date  . Abdominal hysterectomy      History   Social History  . Marital Status: Married    Spouse Name: N/A  . Number of Children: N/A  . Years of Education: N/A   Occupational History  . Homemaker    Social History Main Topics  . Smoking status: Never Smoker   . Smokeless tobacco: Not on file  . Alcohol Use: Not on file  . Drug Use: Not on file  . Sexual Activity: Not on file   Other Topics Concern  . Not on file   Social History Narrative   Does not work outside the home    Current Outpatient Prescriptions on File Prior to Visit  Medication Sig Dispense Refill  . calcium carbonate (OS-CAL - DOSED IN MG OF ELEMENTAL CALCIUM) 1250 MG tablet Take 1 tablet by mouth daily.      . cholecalciferol (VITAMIN D) 1000 UNITS tablet Take 1,000 Units by mouth daily.      . colestipol (COLESTID) 1 G tablet Take 3 g by mouth daily.      . Omega-3 Fatty Acids (FISH OIL) 435 MG CAPS Take 1 capsule by mouth daily. 400mg      . rosuvastatin (CRESTOR) 5 MG tablet Take 1 tablet (5 mg total) by mouth daily. 90 tablet 3  . SYNTHROID 25 MCG tablet Take  1 tablet (25 mcg total) by mouth daily before breakfast. 90 tablet 2   No current facility-administered medications on file prior to visit.    Allergies  Allergen Reactions  . Atorvastatin     REACTION: dental pain, myalgias, and headache  . Lisinopril     REACTION: cough  . Losartan Potassium     REACTION: drowsiness  . Sulfonamide Derivatives     REACTION: Rash, Hives, Asthma    Family History  Problem Relation Age of Onset  . Cancer Mother     Ovarian Cancer  . Lymphoma Sister   . Cancer Sister     Breast Cancer, Kidney Cancer  . Leukemia Sister     BP 128/86 mmHg  Pulse 76  Temp(Src) 98 F (36.7 C) (Oral)  Ht 5' (1.524 m)  Wt 147 lb (66.679 kg)  BMI 28.71 kg/m2  SpO2 95%    Review of Systems Denies cough and muscle cramps.     Objective:   Physical Exam VITAL SIGNS:  See vs page GENERAL: no distress NECK: i do not appreciate a nodule in the thyroid or elsewhere in the neck LUNGS:  Clear to auscultation. HEART:  Regular rate and rhythm without murmurs noted. Normal S1,S2.     Lab Results  Component Value Date   WBC 11.4* 02/16/2015   HGB 13.9 02/16/2015   HCT 41.0 02/16/2015   PLT 341.0 02/16/2015   GLUCOSE 98 02/16/2015   CHOL 196 02/16/2015   TRIG 201.0* 02/16/2015   HDL 55.00 02/16/2015   LDLDIRECT 109.0 02/16/2015   LDLCALC 121* 02/16/2014   ALT 15 02/16/2015   AST 14 02/16/2015   NA 139 02/16/2015   K 4.5 02/16/2015   CL 105 02/16/2015   CREATININE 0.77 02/16/2015   BUN 16 02/16/2015   CO2 25 02/16/2015   TSH 1.77 02/16/2015   HGBA1C 5.7 02/16/2015   MICROALBUR 0.2 02/16/2014      Assessment & Plan:  Dyslipidemia: improved.  Hypothyroidism: well-replaced.  HTN: well-controlled.   Pt declines to change to cheaper med.    Subjective:   Patient here for Medicare annual wellness visit and management of other chronic and acute problems.     Risk factors: advanced age.    Roster of Physicians Providing Medical Care to Patient:   See "snapshot"   Activities of Daily Living: In your present state of health, do you have any difficulty performing the following activities?:  Preparing food and eating?: No  Bathing yourself: No  Getting dressed: No  Using the toilet:No  Moving around from place to place: No  In the past year have you fallen or had a near fall?: No    Home Safety: Has smoke detector and wears seat belts. No firearms. No excess sun exposure.  Diet and Exercise  Current exercise habits: pt says good  Dietary issues discussed: pt reports a healthy diet   Depression Screen  Q1: Over the past two weeks, have you felt down, depressed or hopeless? no  Q2: Over the past two weeks, have you felt little interest or pleasure in doing things? no   The following portions of the patient's history were reviewed and updated as appropriate: allergies, current medications, past family history, past medical history, past social history, past surgical history and problem list.   Review of Systems  Denies hearing loss, and visual loss Objective:   Vision:  Sees opthalmologist Hearing: grossly normal Body mass index:  See vs page Msk: pt easily and quickly performs "get-up-and-go" from a sitting position Cognitive Impairment Assessment: cognition, memory and judgment appear normal.  remembers 3/3 at 5 minutes.  excellent recall.  can easily read and write a sentence.  alert and oriented x 3.    Assessment:   Medicare wellness utd on preventive parameters    Plan:   During the course of the visit the patient was educated and counseled about appropriate screening and preventive services including:        Fall prevention   Screening mammography  Bone densitometry screening  Diabetes screening  Nutrition counseling   Vaccines / LABS Zostavax / Pneumococcal Vaccine  Today.   Patient Instructions (the written plan) was given to the patient.

## 2015-02-16 NOTE — Patient Instructions (Addendum)
please consider these measures for your health:  minimize alcohol.  do not use tobacco products.  have a colonoscopy at least every 10 years from age 72.  Women should have an annual mammogram from age 29.  keep firearms safely stored.  always use seat belts.  have working smoke alarms in your home.  see an eye doctor and dentist regularly.  never drive under the influence of alcohol or drugs (including prescription drugs).  those with fair skin should take precautions against the sun. good diet and exercise significantly improve your health.  please let me know if you wish to be referred to a dietician.  high blood sugar is very risky to your health.  you should see an eye doctor and dentist every year.  It is very important to get all recommended vaccinations.  it is critically important to prevent falling down (keep floor areas well-lit, dry, and free of loose objects.  If you have a cane, walker, or wheelchair, you should use it, even for short trips around the house.  Also, try not to rush).  blood tests are requested for you today.  We'll let you know about the results.   Please come back for a regular physical appointment in 6 months.

## 2015-02-16 NOTE — Progress Notes (Signed)
we discussed code status.  pt requests full code, but would not want to be started or maintained on artificial life-support measures if there was not a reasonable chance of recovery 

## 2015-02-17 ENCOUNTER — Other Ambulatory Visit: Payer: Self-pay

## 2015-02-17 ENCOUNTER — Encounter: Payer: Self-pay | Admitting: Endocrinology

## 2015-02-17 MED ORDER — OLMESARTAN MEDOXOMIL 20 MG PO TABS: 20.0000 mg | ORAL_TABLET | Freq: Every day | ORAL | Status: DC

## 2015-02-17 NOTE — Telephone Encounter (Signed)
Medication refill

## 2015-02-22 ENCOUNTER — Ambulatory Visit (INDEPENDENT_AMBULATORY_CARE_PROVIDER_SITE_OTHER): Payer: Medicare PPO

## 2015-02-22 DIAGNOSIS — Z23 Encounter for immunization: Secondary | ICD-10-CM | POA: Diagnosis not present

## 2015-03-10 ENCOUNTER — Encounter: Payer: Self-pay | Admitting: Gastroenterology

## 2015-03-10 ENCOUNTER — Ambulatory Visit (AMBULATORY_SURGERY_CENTER): Payer: Self-pay | Admitting: *Deleted

## 2015-03-10 VITALS — Ht 60.0 in | Wt 149.2 lb

## 2015-03-10 DIAGNOSIS — Z86010 Personal history of colonic polyps: Secondary | ICD-10-CM

## 2015-03-10 MED ORDER — NA SULFATE-K SULFATE-MG SULF 17.5-3.13-1.6 GM/177ML PO SOLN: 1.0000 | Freq: Once | ORAL | Status: DC

## 2015-03-10 NOTE — Progress Notes (Signed)
No egg or soy allergy No issues with past sedation but pt states she was hard to wake up post op.   No home 02 No diet pills emmi declined

## 2015-03-24 ENCOUNTER — Encounter: Payer: Self-pay | Admitting: Gastroenterology

## 2015-03-24 ENCOUNTER — Ambulatory Visit (AMBULATORY_SURGERY_CENTER): Payer: Medicare PPO | Admitting: Gastroenterology

## 2015-03-24 ENCOUNTER — Other Ambulatory Visit: Payer: Self-pay | Admitting: Gastroenterology

## 2015-03-24 VITALS — BP 159/77 | HR 74 | Temp 97.5°F | Resp 14 | Ht 60.0 in | Wt 142.0 lb

## 2015-03-24 DIAGNOSIS — Z86010 Personal history of colonic polyps: Secondary | ICD-10-CM | POA: Diagnosis not present

## 2015-03-24 DIAGNOSIS — D124 Benign neoplasm of descending colon: Secondary | ICD-10-CM

## 2015-03-24 MED ORDER — SODIUM CHLORIDE 0.9 % IV SOLN: 500.0000 mL | INTRAVENOUS | Status: DC

## 2015-03-24 NOTE — Patient Instructions (Signed)
YOU HAD AN ENDOSCOPIC PROCEDURE TODAY AT [facility]:   Refer to the procedure report that was given to you for any specific questions about what was found during the examination.  If the procedure report does not answer your questions, please call your gastroenterologist to clarify.  If you requested that your care partner not be given the details of your procedure findings, then the procedure report has been included in a sealed envelope for you to review at your convenience later.  YOU SHOULD EXPECT: Some feelings of bloating in the abdomen. Passage of more gas than usual.  Walking can help get rid of the air that was put into your GI tract during the procedure and reduce the bloating. If you had a lower endoscopy (such as a colonoscopy or flexible sigmoidoscopy) you may notice spotting of blood in your stool or on the toilet paper. If you underwent a bowel prep for your procedure, you may not have a normal bowel movement for a few days.  Please Note:  You might notice some irritation and congestion in your nose or some drainage.  This is from the oxygen used during your procedure.  There is no need for concern and it should clear up in a day or so.  SYMPTOMS TO REPORT IMMEDIATELY:   Following lower endoscopy (colonoscopy or flexible sigmoidoscopy):  Excessive amounts of blood in the stool  Significant tenderness or worsening of abdominal pains  Swelling of the abdomen that is new, acute  Fever of 100F or higher  For urgent or emergent issues, a gastroenterologist can be reached at any hour by calling [phone].   DIET: Your first meal following the procedure should be a small meal and then it is ok to progress to your normal diet. Heavy or fried foods are harder to digest and may make you feel nauseous or bloated.  Likewise, meals heavy in dairy and vegetables can increase bloating.  Drink plenty of fluids but you should avoid alcoholic beverages for 24  hours.  ACTIVITY:  You should plan to take it easy for the rest of today and you should NOT DRIVE or use heavy machinery until tomorrow (because of the sedation medicines used during the test).    FOLLOW UP: Our staff will call the number listed on your records the next business day following your procedure to check on you and address any questions or concerns that you may have regarding the information given to you following your procedure. If we do not reach you, we will leave a message.  However, if you are feeling well and you are not experiencing any problems, there is no need to return our call.  We will assume that you have returned to your regular daily activities without incident.  If any biopsies were taken you will be contacted by phone or by letter within the next 1-3 weeks.  Please call us at [phone] if you have not heard about the biopsies in 3 weeks.    SIGNATURES/CONFIDENTIALITY: You and/or your care partner have signed paperwork which will be entered into your electronic medical record.  These signatures attest to the fact that that the information above on your After Visit Summary has been reviewed and is understood.  Full responsibility of the confidentiality of this discharge information lies with you and/or your care-partner.  Polyps, diverticulosis, high fiber diet, hemorrhoids-handouts given  Repeat colonoscopy in 5 years-2021.

## 2015-03-24 NOTE — Progress Notes (Signed)
Called to room to assist during endoscopic procedure.  Patient ID and intended procedure confirmed with present staff. Received instructions for my participation in the procedure from the performing physician.  

## 2015-03-24 NOTE — Progress Notes (Signed)
Report to PACU, RN, vss, BBS= Clear.  

## 2015-03-24 NOTE — Op Note (Signed)
[facility]  [address] [place], [id number]   COLONOSCOPY PROCEDURE REPORT  PATIENT: Donna, Andersen  MR#: [id number] BIRTHDATE: 16-Oct-1941 , 74  yrs. old GENDER: female ENDOSCOPIST: Ladene Artist, MD, [facility] PROCEDURE DATE:  03/24/2015 PROCEDURE:   Colonoscopy, surveillance , Colonoscopy with biopsy, and Colonoscopy with snare polypectomy First Screening Colonoscopy - Avg.  risk and is 50 yrs.  old or older - No.  Prior Negative Screening - Now for repeat screening. N/A  History of Adenoma - Now for follow-up colonoscopy & has been > or = to 3 yrs.  Yes hx of adenoma.  Has been 3 or more years since last colonoscopy.  Polyps removed today? Yes ASA CLASS:   Class II INDICATIONS:Surveillance due to prior colonic neoplasia and PH Colon Adenoma. MEDICATIONS: Monitored anesthesia care, Propofol 200 mg IV, and lidocaine 100 mg IV DESCRIPTION OF PROCEDURE:   After the risks benefits and alternatives of the procedure were thoroughly explained, informed consent was obtained.  The digital rectal exam revealed no abnormalities of the rectum.   The LB PCF Q180 [id number]  endoscope was introduced through the anus and advanced to the cecum, which was identified by both the appendix and ileocecal valve. No adverse events experienced.   The quality of the prep was good.  (Suprep was used)  The instrument was then slowly withdrawn as the colon was fully examined. Estimated blood loss is zero unless otherwise noted in this procedure report.  COLON FINDINGS: Two sessile polyps measuring 3-6 mm in size were found in the descending colon.  Polypectomies were performed with a cold snare (6 mm polyp) and with cold forceps (3 mm polyp). The resection was complete, the polyp tissue was completely retrieved and sent to histology.  There was moderate diverticulosis noted in the sigmoid colon and descending colon with associated luminal narrowing and muscular hypertrophy.  There was mild  diverticulosis noted in the transverse colon and ascending colon.   The examination was otherwise normal.  Retroflexed views revealed internal Grade I hemorrhoids. The time to cecum = 4.1 Withdrawal time = 10.4   The scope was withdrawn and the procedure completed. COMPLICATIONS: There were no immediate complications.  ENDOSCOPIC IMPRESSION: 1.   Two sessile polyps in the descending colon; polypectomies performed with a cold snare and with cold forceps 2.   Moderate diverticulosis in the sigmoid colon and descending colon 3.   Mild diverticulosis in the transverse colon and ascending colon  4.   Grade l internal hemorrhoids  RECOMMENDATIONS: 1.  Await pathology results 2.  High fiber diet with liberal fluid intake. 3.  Repeat Colonoscopy in 5 years.  eSigned:  Ladene Artist, MD, [facility] 03/24/2015 9:29 AM

## 2015-03-25 ENCOUNTER — Telehealth: Payer: Self-pay | Admitting: *Deleted

## 2015-03-25 NOTE — Telephone Encounter (Signed)
No answer, left message to call office if questions or concern.

## 2015-03-30 ENCOUNTER — Encounter: Payer: Self-pay | Admitting: Gastroenterology

## 2015-05-13 ENCOUNTER — Encounter: Payer: Self-pay | Admitting: Gastroenterology

## 2015-07-04 ENCOUNTER — Telehealth: Payer: Self-pay | Admitting: Endocrinology

## 2015-07-04 NOTE — Telephone Encounter (Signed)
Left voicemail advising the pt we are still working on this information.

## 2015-07-04 NOTE — Telephone Encounter (Signed)
Pt is asking for the follow up of the shingles vaccine have you been able to contact them regarding this.

## 2015-07-06 ENCOUNTER — Telehealth: Payer: Self-pay

## 2015-07-06 NOTE — Telephone Encounter (Signed)
Pt will get B/P recheck on f/u visit 08/18/2015

## 2015-08-17 ENCOUNTER — Other Ambulatory Visit: Payer: Self-pay

## 2015-08-17 ENCOUNTER — Telehealth: Payer: Self-pay | Admitting: Endocrinology

## 2015-08-17 MED ORDER — SYNTHROID 25 MCG PO TABS: 25.0000 ug | ORAL_TABLET | Freq: Every day | ORAL | Status: DC

## 2015-08-17 MED ORDER — ROSUVASTATIN CALCIUM 5 MG PO TABS: 5.0000 mg | ORAL_TABLET | Freq: Every day | ORAL | Status: DC

## 2015-08-17 NOTE — Telephone Encounter (Signed)
Patient need a new prescription for the Crestor 5 mg levethyroxine 25 mcg

## 2015-08-18 ENCOUNTER — Encounter: Payer: Medicare PPO | Admitting: Endocrinology

## 2015-11-06 LAB — HM COLONOSCOPY

## 2016-02-28 ENCOUNTER — Encounter: Payer: Self-pay | Admitting: Endocrinology

## 2016-02-28 ENCOUNTER — Ambulatory Visit (INDEPENDENT_AMBULATORY_CARE_PROVIDER_SITE_OTHER): Payer: Medicare PPO | Admitting: Endocrinology

## 2016-02-28 VITALS — BP 132/70 | HR 63 | Temp 97.7°F | Ht 60.0 in | Wt 149.0 lb

## 2016-02-28 DIAGNOSIS — E890 Postprocedural hypothyroidism: Secondary | ICD-10-CM

## 2016-02-28 DIAGNOSIS — R739 Hyperglycemia, unspecified: Secondary | ICD-10-CM | POA: Diagnosis not present

## 2016-02-28 DIAGNOSIS — E042 Nontoxic multinodular goiter: Secondary | ICD-10-CM

## 2016-02-28 DIAGNOSIS — Z0000 Encounter for general adult medical examination without abnormal findings: Secondary | ICD-10-CM | POA: Diagnosis not present

## 2016-02-28 DIAGNOSIS — M810 Age-related osteoporosis without current pathological fracture: Secondary | ICD-10-CM

## 2016-02-28 DIAGNOSIS — I10 Essential (primary) hypertension: Secondary | ICD-10-CM | POA: Diagnosis not present

## 2016-02-28 DIAGNOSIS — E785 Hyperlipidemia, unspecified: Secondary | ICD-10-CM

## 2016-02-28 LAB — HEPATIC FUNCTION PANEL
ALK PHOS: 70 U/L (ref 39–117)
ALT: 17 U/L (ref 0–35)
AST: 17 U/L (ref 0–37)
Albumin: 4.5 g/dL (ref 3.5–5.2)
BILIRUBIN DIRECT: 0.1 mg/dL (ref 0.0–0.3)
BILIRUBIN TOTAL: 0.5 mg/dL (ref 0.2–1.2)
Total Protein: 7.3 g/dL (ref 6.0–8.3)

## 2016-02-28 LAB — LIPID PANEL
CHOL/HDL RATIO: 4
Cholesterol: 204 mg/dL — ABNORMAL HIGH (ref 0–200)
HDL: 53.8 mg/dL (ref >39.00)
LDL Cholesterol: 115 mg/dL — ABNORMAL HIGH (ref 0–99)
NONHDL: 150.09
Triglycerides: 175 mg/dL — ABNORMAL HIGH (ref 0.0–149.0)
VLDL: 35 mg/dL (ref 0.0–40.0)

## 2016-02-28 LAB — CBC WITH DIFFERENTIAL/PLATELET
BASOS ABS: 0.1 10*3/uL (ref 0.0–0.1)
BASOS PCT: 0.9 % (ref 0.0–3.0)
EOS ABS: 0.1 10*3/uL (ref 0.0–0.7)
EOS PCT: 1.7 % (ref 0.0–5.0)
HEMATOCRIT: 41 % (ref 36.0–46.0)
Hemoglobin: 13.9 g/dL (ref 12.0–15.0)
LYMPHS ABS: 2 10*3/uL (ref 0.7–4.0)
Lymphocytes Relative: 23.7 % (ref 12.0–46.0)
MCHC: 33.8 g/dL (ref 30.0–36.0)
MCV: 90.4 fl (ref 78.0–100.0)
MONO ABS: 0.6 10*3/uL (ref 0.1–1.0)
MONOS PCT: 6.6 % (ref 3.0–12.0)
NEUTROS PCT: 67.1 % (ref 43.0–77.0)
Neutro Abs: 5.6 10*3/uL (ref 1.4–7.7)
PLATELETS: 309 10*3/uL (ref 150.0–400.0)
RBC: 4.54 Mil/uL (ref 3.87–5.11)
RDW: 12.9 % (ref 11.5–15.5)
WBC: 8.3 10*3/uL (ref 4.0–10.5)

## 2016-02-28 LAB — BASIC METABOLIC PANEL
BUN: 13 mg/dL (ref 6–23)
CHLORIDE: 104 meq/L (ref 96–112)
CO2: 28 meq/L (ref 19–32)
Calcium: 9.8 mg/dL (ref 8.4–10.5)
Creatinine, Ser: 0.78 mg/dL (ref 0.40–1.20)
GFR: 76.96 mL/min (ref >60.00)
Glucose, Bld: 104 mg/dL — ABNORMAL HIGH (ref 70–99)
POTASSIUM: 4.5 meq/L (ref 3.5–5.1)
Sodium: 140 mEq/L (ref 135–145)

## 2016-02-28 LAB — TSH: TSH: 1.67 u[IU]/mL (ref 0.35–4.50)

## 2016-02-28 LAB — HEMOGLOBIN A1C: HEMOGLOBIN A1C: 5.6 % (ref 4.6–6.5)

## 2016-02-28 LAB — VITAMIN D 25 HYDROXY (VIT D DEFICIENCY, FRACTURES): VITD: 23.11 ng/mL — ABNORMAL LOW (ref 30.00–100.00)

## 2016-02-28 NOTE — Progress Notes (Signed)
Subjective:    Patient ID: Donna Andersen, female    DOB: 11-Aug-1941, 75 y.o.   MRN: [id number]  HPI Pt is here for regular wellness examination, and is feeling pretty well in general, and says chronic med probs are stable, except as noted below.   Past Medical History  Diagnosis Date  . Acute cystitis 09/26/2007    recurrent UTI's  . COUGH DUE TO ACE INHIBITORS 06/20/2009  . 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 GOITER, MULTINODULAR 10/11/2009    Benign L thyroid nodule  . Headache(784.0) 06/08/2008  . HEMATURIA UNSPECIFIED 10/05/2008  . HYPERGLYCEMIA 09/26/2007  . HYPERLIPIDEMIA 07/21/2007  . HYPERTENSION 11/22/2008  . HYPOTHYROIDISM 07/21/2007  . INSOMNIA 06/08/2008  . MYALGIA 10/23/2010  . OSTEOPOROSIS 07/21/2007  . WEIGHT GAIN 06/08/2008  . Allergy     Past Surgical History  Procedure Laterality Date  . Abdominal hysterectomy  1974  . Thyroid surgery  2011    left nodule removed  . Breast lumpectomy  1976    benign  . Vaginal delivery      x1  . Colonoscopy    . Polypectomy      Social History   Social History  . Marital Status: Married    Spouse Name: N/A  . Number of Children: N/A  . Years of Education: N/A   Occupational History  . Homemaker    Social History Main Topics  . Smoking status: Never Smoker   . Smokeless tobacco: Never Used  . Alcohol Use: 0.0 oz/week    0 Standard drinks or equivalent per week     Comment: socially-2 glasses a week  . Drug Use: No  . Sexual Activity: Not on file   Other Topics Concern  . Not on file   Social History Narrative   Does not work outside the home    Current Outpatient Prescriptions on File Prior to Visit  Medication Sig Dispense Refill  . calcium carbonate (OS-CAL - DOSED IN MG OF ELEMENTAL CALCIUM) 1250 MG tablet Take 1 tablet by mouth daily.      . cholecalciferol (VITAMIN D) 1000 UNITS tablet Take 1,000 Units by mouth daily.      . Coenzyme Q10 (CO Q 10) 100 MG CAPS Take 100 mg by mouth daily.    Marland Kitchen  olmesartan (BENICAR) 20 MG tablet Take 1 tablet (20 mg total) by mouth daily. 90 tablet 3  . rosuvastatin (CRESTOR) 5 MG tablet Take 1 tablet (5 mg total) by mouth daily. 90 tablet 3  . SYNTHROID 25 MCG tablet Take 1 tablet (25 mcg total) by mouth daily before breakfast. 90 tablet 2   No current facility-administered medications on file prior to visit.    Allergies  Allergen Reactions  . Atorvastatin     REACTION: dental pain, myalgias, and headache  . Lisinopril     REACTION: cough  . Losartan Potassium     REACTION: drowsiness  . Sulfonamide Derivatives     REACTION: Rash, Hives, Asthma  . Latex Itching and Rash     scars  . Other Rash and Hives    shrimp. shrimp - Anaphylaxis (swelling & hives) Tape. Tape - scars, itching from latex tape    Family History  Problem Relation Age of Onset  . Cancer Mother     Ovarian Cancer  . Ovarian cancer Mother   . Lymphoma Sister   . Cancer Sister     Breast Cancer, Kidney Cancer  . Leukemia Sister   .  Colon cancer Neg Hx   . Rectal cancer Neg Hx   . Stomach cancer Neg Hx     BP 132/70 mmHg  Pulse 63  Temp(Src) 97.7 F (36.5 C) (Oral)  Ht 5' (1.524 m)  Wt 149 lb (67.586 kg)  BMI 29.10 kg/m2  SpO2 97%  Review of Systems  Constitutional: Negative for fever.  HENT: Negative for hearing loss.   Eyes: Negative for visual disturbance.  Respiratory: Negative for shortness of breath.   Cardiovascular: Negative for chest pain.  Gastrointestinal: Negative for blood in stool.  Endocrine: Negative for cold intolerance.  Genitourinary: Negative for hematuria.  Musculoskeletal: Negative for back pain.  Skin: Negative for rash.  Allergic/Immunologic: Positive for environmental allergies.  Neurological: Negative for syncope and headaches.  Hematological: Does not bruise/bleed easily.  Psychiatric/Behavioral: Negative for dysphoric mood.      Objective:   Physical Exam VS: see vs page GEN: no distress HEAD: head: no  deformity eyes: no periorbital swelling, no proptosis external nose and ears are normal mouth: no lesion seen Both eac's and tm's are normal. NECK: supple, thyroid is not enlarged CHEST WALL: no deformity LUNGS:  Clear to auscultation BREASTS: sees gyn CV: reg rate and rhythm, no murmur ABD: abdomen is soft, nontender.  no hepatosplenomegaly.  not distended.  no hernia.  GENITALIA/RECTAL: sees gyn MUSCULOSKELETAL: muscle bulk and strength are grossly normal.  no obvious joint swelling.  gait is normal and steady EXTEMITIES: no deformity.  no ulcer on the feet.  feet are of normal color and temp.  no edema PULSES: dorsalis pedis intact bilat.  no carotid bruit NEURO:  cn 2-12 grossly intact.   readily moves all 4's.  sensation is intact to touch on the feet.  SKIN:  Normal texture and temperature.  No rash or suspicious lesion is visible.   NODES:  None palpable at the neck.   PSYCH: alert, well-oriented.  Does not appear anxious nor depressed.    ecg not done, as we are out of supplies.      Assessment & Plan:  Wellness visit today, with problems stable, except as noted.  Patient is advised the following: Patient Instructions  please consider these measures for your health:  minimize alcohol.  do not use tobacco products.  have a colonoscopy at least every 10 years from age 18.  Women should have an annual mammogram from age 4.  keep firearms safely stored.  always use seat belts.  have working smoke alarms in your home.  see an eye doctor and dentist regularly.  never drive under the influence of alcohol or drugs (including prescription drugs).  those with fair skin should take precautions against the sun.   it is critically important to prevent falling down (keep floor areas well-lit, dry, and free of loose objects.  If you have a cane, walker, or wheelchair, you should use it, even for short trips around the house.  Wear flat-soled shoes.  Also, try not to rush) blood tests are  requested for you today.  We'll let you know about the results.   Please return in 1 year.

## 2016-02-28 NOTE — Patient Instructions (Addendum)
please consider these measures for your health:  minimize alcohol.  do not use tobacco products.  have a colonoscopy at least every 10 years from age 73.  Women should have an annual mammogram from age 19.  keep firearms safely stored.  always use seat belts.  have working smoke alarms in your home.  see an eye doctor and dentist regularly.  never drive under the influence of alcohol or drugs (including prescription drugs).  those with fair skin should take precautions against the sun.   it is critically important to prevent falling down (keep floor areas well-lit, dry, and free of loose objects.  If you have a cane, walker, or wheelchair, you should use it, even for short trips around the house.  Wear flat-soled shoes.  Also, try not to rush) blood tests are requested for you today.  We'll let you know about the results.   Please return in 1 year.

## 2016-02-28 NOTE — Progress Notes (Signed)
we discussed code status.  pt requests full code, but would not want to be started or maintained on artificial life-support measures if there was not a reasonable chance of recovery 

## 2016-03-08 ENCOUNTER — Ambulatory Visit (INDEPENDENT_AMBULATORY_CARE_PROVIDER_SITE_OTHER): Payer: Medicare PPO

## 2016-03-08 VITALS — BP 132/87

## 2016-03-08 DIAGNOSIS — Z0000 Encounter for general adult medical examination without abnormal findings: Secondary | ICD-10-CM | POA: Diagnosis not present

## 2016-03-13 ENCOUNTER — Ambulatory Visit (INDEPENDENT_AMBULATORY_CARE_PROVIDER_SITE_OTHER)
Admission: RE | Admit: 2016-03-13 | Discharge: 2016-03-13 | Disposition: A | Discharge status: Home / Self Care | Payer: Medicare PPO | Source: Ambulatory Visit | Attending: Endocrinology | Admitting: Endocrinology

## 2016-03-13 DIAGNOSIS — M810 Age-related osteoporosis without current pathological fracture: Secondary | ICD-10-CM

## 2016-05-23 ENCOUNTER — Encounter: Payer: Self-pay | Admitting: Endocrinology

## 2016-05-24 ENCOUNTER — Encounter: Payer: Self-pay | Admitting: Endocrinology

## 2016-05-24 ENCOUNTER — Other Ambulatory Visit: Payer: Self-pay

## 2016-05-24 MED ORDER — OLMESARTAN MEDOXOMIL 20 MG PO TABS: 20.0000 mg | ORAL_TABLET | Freq: Every day | ORAL | Status: DC

## 2017-02-27 ENCOUNTER — Ambulatory Visit: Payer: Medicare PPO | Admitting: Endocrinology

## 2017-03-04 ENCOUNTER — Encounter: Payer: Self-pay | Admitting: Endocrinology

## 2017-03-04 ENCOUNTER — Ambulatory Visit (INDEPENDENT_AMBULATORY_CARE_PROVIDER_SITE_OTHER): Payer: Medicare PPO | Admitting: Endocrinology

## 2017-03-04 VITALS — BP 136/84 | HR 74 | Ht 60.0 in | Wt 146.0 lb

## 2017-03-04 DIAGNOSIS — Z0000 Encounter for general adult medical examination without abnormal findings: Secondary | ICD-10-CM | POA: Diagnosis not present

## 2017-03-04 DIAGNOSIS — M810 Age-related osteoporosis without current pathological fracture: Secondary | ICD-10-CM

## 2017-03-04 DIAGNOSIS — E042 Nontoxic multinodular goiter: Secondary | ICD-10-CM | POA: Diagnosis not present

## 2017-03-04 DIAGNOSIS — R739 Hyperglycemia, unspecified: Secondary | ICD-10-CM | POA: Diagnosis not present

## 2017-03-04 DIAGNOSIS — I10 Essential (primary) hypertension: Secondary | ICD-10-CM | POA: Diagnosis not present

## 2017-03-04 DIAGNOSIS — E785 Hyperlipidemia, unspecified: Secondary | ICD-10-CM | POA: Diagnosis not present

## 2017-03-04 LAB — LIPID PANEL
CHOLESTEROL: 202 mg/dL — AB (ref 0–200)
HDL: 53 mg/dL (ref >39.00)
LDL CALC: 114 mg/dL — AB (ref 0–99)
NonHDL: 149.17
Total CHOL/HDL Ratio: 4
Triglycerides: 177 mg/dL — ABNORMAL HIGH (ref 0.0–149.0)
VLDL: 35.4 mg/dL (ref 0.0–40.0)

## 2017-03-04 LAB — HEPATIC FUNCTION PANEL
ALBUMIN: 4.5 g/dL (ref 3.5–5.2)
ALT: 20 U/L (ref 0–35)
AST: 15 U/L (ref 0–37)
Alkaline Phosphatase: 82 U/L (ref 39–117)
BILIRUBIN TOTAL: 0.6 mg/dL (ref 0.2–1.2)
Bilirubin, Direct: 0 mg/dL (ref 0.0–0.3)
Total Protein: 7.1 g/dL (ref 6.0–8.3)

## 2017-03-04 LAB — BASIC METABOLIC PANEL
BUN: 15 mg/dL (ref 6–23)
CALCIUM: 9.3 mg/dL (ref 8.4–10.5)
CO2: 27 meq/L (ref 19–32)
CREATININE: 0.71 mg/dL (ref 0.40–1.20)
Chloride: 105 mEq/L (ref 96–112)
GFR: 85.54 mL/min (ref >60.00)
GLUCOSE: 99 mg/dL (ref 70–99)
Potassium: 4.2 mEq/L (ref 3.5–5.1)
Sodium: 139 mEq/L (ref 135–145)

## 2017-03-04 LAB — CBC WITH DIFFERENTIAL/PLATELET
BASOS ABS: 0.1 10*3/uL (ref 0.0–0.1)
Basophils Relative: 0.9 % (ref 0.0–3.0)
Eosinophils Absolute: 0.2 10*3/uL (ref 0.0–0.7)
Eosinophils Relative: 2.1 % (ref 0.0–5.0)
HCT: 42.8 % (ref 36.0–46.0)
HEMOGLOBIN: 14.3 g/dL (ref 12.0–15.0)
LYMPHS ABS: 1.8 10*3/uL (ref 0.7–4.0)
Lymphocytes Relative: 23.2 % (ref 12.0–46.0)
MCHC: 33.5 g/dL (ref 30.0–36.0)
MCV: 91.2 fl (ref 78.0–100.0)
MONOS PCT: 6.9 % (ref 3.0–12.0)
Monocytes Absolute: 0.5 10*3/uL (ref 0.1–1.0)
Neutro Abs: 5.1 10*3/uL (ref 1.4–7.7)
Neutrophils Relative %: 66.9 % (ref 43.0–77.0)
Platelets: 279 10*3/uL (ref 150.0–400.0)
RBC: 4.69 Mil/uL (ref 3.87–5.11)
RDW: 13 % (ref 11.5–15.5)
WBC: 7.6 10*3/uL (ref 4.0–10.5)

## 2017-03-04 LAB — HEMOGLOBIN A1C: Hgb A1c MFr Bld: 5.7 % (ref 4.6–6.5)

## 2017-03-04 LAB — TSH: TSH: 2.29 u[IU]/mL (ref 0.35–4.50)

## 2017-03-04 LAB — VITAMIN D 25 HYDROXY (VIT D DEFICIENCY, FRACTURES): VITD: 42.7 ng/mL (ref 30.00–100.00)

## 2017-03-04 NOTE — Progress Notes (Signed)
Subjective:    Patient ID: Donna Andersen, female    DOB: 02/13/1941, 76 y.o.   MRN: [id number]  HPI Pt is here for regular wellness examination, and is feeling pretty well in general, and says chronic med probs are stable, except as noted below Past Medical History:  Diagnosis Date  . Acute cystitis 09/26/2007   recurrent UTI's  . Allergy   . COUGH DUE TO ACE INHIBITORS 06/20/2009  . 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 GOITER, MULTINODULAR 10/11/2009   Benign L thyroid nodule  . Headache(784.0) 06/08/2008  . HEMATURIA UNSPECIFIED 10/05/2008  . HYPERGLYCEMIA 09/26/2007  . HYPERLIPIDEMIA 07/21/2007  . HYPERTENSION 11/22/2008  . HYPOTHYROIDISM 07/21/2007  . INSOMNIA 06/08/2008  . MYALGIA 10/23/2010  . OSTEOPOROSIS 07/21/2007  . WEIGHT GAIN 06/08/2008    Past Surgical History:  Procedure Laterality Date  . ABDOMINAL HYSTERECTOMY  1974  . BREAST LUMPECTOMY  1976   benign  . COLONOSCOPY    . POLYPECTOMY    . THYROID SURGERY  2011   left nodule removed  . VAGINAL DELIVERY     x1    Social History   Social History  . Marital status: Married    Spouse name: N/A  . Number of children: N/A  . Years of education: N/A   Occupational History  . Homemaker Retired   Social History Main Topics  . Smoking status: Never Smoker  . Smokeless tobacco: Never Used  . Alcohol use 0.0 oz/week     Comment: socially-2 glasses a week  . Drug use: No  . Sexual activity: Not on file   Other Topics Concern  . Not on file   Social History Narrative   Does not work outside the home    Current Outpatient Prescriptions on File Prior to Visit  Medication Sig Dispense Refill  . calcium carbonate (OS-CAL - DOSED IN MG OF ELEMENTAL CALCIUM) 1250 MG tablet Take 1 tablet by mouth daily.      . cholecalciferol (VITAMIN D) 1000 UNITS tablet Take 1,000 Units by mouth daily.      . Coenzyme Q10 (CO Q 10) 100 MG CAPS Take 100 mg by mouth daily.    Marland Kitchen olmesartan (BENICAR) 20 MG tablet Take 1 tablet  (20 mg total) by mouth daily. 90 tablet 3  . rosuvastatin (CRESTOR) 5 MG tablet Take 1 tablet (5 mg total) by mouth daily. 90 tablet 3  . SYNTHROID 25 MCG tablet Take 1 tablet (25 mcg total) by mouth daily before breakfast. 90 tablet 2   No current facility-administered medications on file prior to visit.     Allergies  Allergen Reactions  . Atorvastatin     REACTION: dental pain, myalgias, and headache  . Lisinopril     REACTION: cough  . Losartan Potassium     REACTION: drowsiness  . Sulfonamide Derivatives     REACTION: Rash, Hives, Asthma  . Latex Itching and Rash     scars  . Other Rash and Hives    shrimp. shrimp - Anaphylaxis (swelling & hives) Tape. Tape - scars, itching from latex tape    Family History  Problem Relation Age of Onset  . Cancer Mother     Ovarian Cancer  . Ovarian cancer Mother   . Lymphoma Sister   . Cancer Sister     Breast Cancer, Kidney Cancer  . Leukemia Sister   . Colon cancer Neg Hx   . Rectal cancer Neg Hx   . Stomach cancer Neg  Hx     BP 136/84   Pulse 74   Ht 5' (1.524 m)   Wt 146 lb (66.2 kg)   SpO2 94%   BMI 28.51 kg/m     Review of Systems Constitutional: Negative for fever.  HENT: Negative for voice change.   Eyes: Negative for light sensitivity.  Respiratory: Negative for shortness of breath.   Cardiovascular: Negative for chest pain.  Gastrointestinal: Negative for blood in stool.  Endocrine: Negative for cold intolerance.  Genitourinary: Negative for hematuria.  Musculoskeletal: Negative for back pain.  Skin: Negative for rash.  Allergic/Immunologic: Positive for environmental allergies.  Neurological: Negative for syncope and headaches.  Hematological: Does not bruise/bleed easily.  Psychiatric/Behavioral: Negative for dysphoric mood.     Objective:   Physical Exam VS: see vs page GEN: no distress HEAD: head: no deformity eyes: no periorbital swelling, no proptosis external nose and ears are  normal mouth: no lesion seen Both eac's and tm's are normal. NECK: a healed scar is present.  Small multinodular goiter is again noted.   CHEST WALL: no deformity LUNGS:  Clear to auscultation BREASTS: sees gyn CV: reg rate and rhythm, no murmur ABD: abdomen is soft, nontender.  no hepatosplenomegaly.  not distended.  no hernia.  GENITALIA/RECTAL: sees gyn MUSCULOSKELETAL: muscle bulk and strength are grossly normal.  no obvious joint swelling.  gait is normal and steady EXTEMITIES: no deformity.  no ulcer on the feet.  feet are of normal color and temp.  no edema PULSES: dorsalis pedis intact bilat.  no carotid bruit NEURO:  cn 2-12 grossly intact.   readily moves all 4's.  sensation is intact to touch on the feet.  SKIN:  Normal texture and temperature.  No rash or suspicious lesion is visible.   NODES:  None palpable at the neck.   PSYCH: alert, well-oriented.  Does not appear anxious nor depressed.      Assessment & Plan:  Wellness visit today, with problems stable, except as noted.   Subjective:   Patient here for Medicare annual wellness visit and management of other chronic and acute problems.     Risk factors: advanced age    23 of Physicians Providing Medical Care to Patient:  See "snapshot"   Activities of Daily Living: In your present state of health, do you have any difficulty performing the following activities (lives with husband)?:  Preparing food and eating?: No  Bathing yourself: No  Getting dressed: No  Using the toilet: No  Moving around from place to place: No  In the past year have you fallen or had a near fall?: No    Home Safety: Has smoke detector and wears seat belts. No firearms. No excess sun exposure.  Diet and Exercise  Current exercise habits: pt says good Dietary issues discussed: pt reports a healthy diet   Depression Screen  Q1: Over the past two weeks, have you felt down, depressed or hopeless? no  Q2: Over the past two weeks, have  you felt little interest or pleasure in doing things? no   The following portions of the patient's history were reviewed and updated as appropriate: allergies, current medications, past family history, past medical history, past social history, past surgical history and problem list.  Past Medical History:  Diagnosis Date  . Acute cystitis 09/26/2007   recurrent UTI's  . Allergy   . COUGH DUE TO ACE INHIBITORS 06/20/2009  . 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 GOITER, MULTINODULAR 10/11/2009   Benign  L thyroid nodule  . Headache(784.0) 06/08/2008  . HEMATURIA UNSPECIFIED 10/05/2008  . HYPERGLYCEMIA 09/26/2007  . HYPERLIPIDEMIA 07/21/2007  . HYPERTENSION 11/22/2008  . HYPOTHYROIDISM 07/21/2007  . INSOMNIA 06/08/2008  . MYALGIA 10/23/2010  . OSTEOPOROSIS 07/21/2007  . WEIGHT GAIN 06/08/2008    Past Surgical History:  Procedure Laterality Date  . ABDOMINAL HYSTERECTOMY  1974  . BREAST LUMPECTOMY  1976   benign  . COLONOSCOPY    . POLYPECTOMY    . THYROID SURGERY  2011   left nodule removed  . VAGINAL DELIVERY     x1    Social History   Social History  . Marital status: Married    Spouse name: N/A  . Number of children: N/A  . Years of education: N/A   Occupational History  . Homemaker Retired   Social History Main Topics  . Smoking status: Never Smoker  . Smokeless tobacco: Never Used  . Alcohol use 0.0 oz/week     Comment: socially-2 glasses a week  . Drug use: No  . Sexual activity: Not on file   Other Topics Concern  . Not on file   Social History Narrative   Does not work outside the home    Current Outpatient Prescriptions on File Prior to Visit  Medication Sig Dispense Refill  . calcium carbonate (OS-CAL - DOSED IN MG OF ELEMENTAL CALCIUM) 1250 MG tablet Take 1 tablet by mouth daily.      . cholecalciferol (VITAMIN D) 1000 UNITS tablet Take 1,000 Units by mouth daily.      . Coenzyme Q10 (CO Q 10) 100 MG CAPS Take 100 mg by mouth daily.    Marland Kitchen olmesartan  (BENICAR) 20 MG tablet Take 1 tablet (20 mg total) by mouth daily. 90 tablet 3  . rosuvastatin (CRESTOR) 5 MG tablet Take 1 tablet (5 mg total) by mouth daily. 90 tablet 3  . SYNTHROID 25 MCG tablet Take 1 tablet (25 mcg total) by mouth daily before breakfast. 90 tablet 2   No current facility-administered medications on file prior to visit.     Allergies  Allergen Reactions  . Atorvastatin     REACTION: dental pain, myalgias, and headache  . Lisinopril     REACTION: cough  . Losartan Potassium     REACTION: drowsiness  . Sulfonamide Derivatives     REACTION: Rash, Hives, Asthma  . Latex Itching and Rash     scars  . Other Rash and Hives    shrimp. shrimp - Anaphylaxis (swelling & hives) Tape. Tape - scars, itching from latex tape    Family History  Problem Relation Age of Onset  . Cancer Mother     Ovarian Cancer  . Ovarian cancer Mother   . Lymphoma Sister   . Cancer Sister     Breast Cancer, Kidney Cancer  . Leukemia Sister   . Colon cancer Neg Hx   . Rectal cancer Neg Hx   . Stomach cancer Neg Hx     BP 136/84   Pulse 74   Ht 5' (1.524 m)   Wt 146 lb (66.2 kg)   SpO2 94%   BMI 28.51 kg/m   Review of Systems  Denies hearing loss, and visual loss Objective:   Vision:  Sees opthalmologist Dr Ellie Lunch, so she declines [facility] today Hearing: grossly normal Body mass index:  See vs page Msk: pt easily and quickly performs "get-up-and-go" from a sitting position Cognitive Impairment Assessment: cognition, memory and judgment appear normal.  remembers 3/3 at 5 minutes.  excellent recall.  can easily read and write a sentence.  alert and oriented x 3   Assessment:   Medicare wellness utd on preventive parameters    Plan:   During the course of the visit the patient was educated and counseled about appropriate screening and preventive services including:       Fall prevention is advised   Screening mammography is UTD Bone densitometry screening is UTD  Diabetes  screening is done today Nutrition counseling is offered  Vaccines are UTD today   Patient Instructions (the written plan) was given to the patient.

## 2017-03-04 NOTE — Patient Instructions (Addendum)
Please consider these measures for your health:  minimize alcohol.  Do not use tobacco products.  Have a colonoscopy at least every 10 years from age 74.  Women should have an annual mammogram from age 25.  Keep firearms safely stored.  Always use seat belts.  have working smoke alarms in your home.  See an eye doctor and dentist regularly.  Never drive under the influence of alcohol or drugs (including prescription drugs).  Those with fair skin should take precautions against the sun, and should carefully examine their skin once per month, for any new or changed moles. please let me know what your wishes would be, if artificial life support measures should become necessary.  It is critically important to prevent falling down (keep floor areas well-lit, dry, and free of loose objects.  If you have a cane, walker, or wheelchair, you should use it, even for short trips around the house.  Wear flat-soled shoes.  Also, try not to rush).  good diet and exercise significantly improve your health.  please let me know if you wish to be referred to a dietician.  high blood sugar is very risky to your health.  you should see an eye doctor and dentist every year.  It is very important to get all recommended vaccinations.  I have requested "Prolia" (twice a year injection given here at the office).   you will receive a phone call, about a day and time for an appointment. blood tests are requested for you today.  We'll let you know about the results. Please return in 1 year.

## 2017-03-04 NOTE — Progress Notes (Signed)
we discussed code status.  pt requests full code, but would not want to be started or maintained on artificial life-support measures if there was not a reasonable chance of recovery 

## 2017-03-05 LAB — PTH, INTACT AND CALCIUM
Calcium: 9 mg/dL (ref 8.6–10.4)
PTH: 40 pg/mL (ref 14–64)

## 2017-03-10 ENCOUNTER — Encounter: Payer: Self-pay | Admitting: Endocrinology

## 2017-03-11 ENCOUNTER — Telehealth: Payer: Self-pay | Admitting: Endocrinology

## 2017-03-11 ENCOUNTER — Other Ambulatory Visit: Payer: Self-pay

## 2017-03-11 MED ORDER — ROSUVASTATIN CALCIUM 5 MG PO TABS: 5.0000 mg | ORAL_TABLET | Freq: Every day | ORAL | 3 refills | Status: DC

## 2017-03-11 NOTE — Telephone Encounter (Signed)
Pt called and said she was needing her Crestor refilled and sent to [facility] on [address].  Also she said that her medications are not listed in Mychart and wanted to know if there is a way to release her medication list to [place].  The last thing she wanted Dr. Loanne Drilling to know that she is going to delay the Prolia for another year, due to some of the side effects and will see about this after her visit next year.

## 2017-03-12 ENCOUNTER — Other Ambulatory Visit: Payer: Self-pay

## 2017-03-12 NOTE — Telephone Encounter (Signed)
crestor was sent

## 2017-03-12 NOTE — Telephone Encounter (Signed)
Notified patient of prescription via vm and requested a call back to verify her med list

## 2017-03-12 NOTE — Telephone Encounter (Signed)
Both are ok

## 2017-03-15 ENCOUNTER — Telehealth: Payer: Self-pay | Admitting: *Deleted

## 2017-03-15 NOTE — Telephone Encounter (Signed)
Information has been submitted to pts insurance for verification of benefits. Awaiting response for coverage  Fax received indicating PA required. Form faxed to [facility] at endo for completion and submission. Will await confirmation/denial.

## 2017-06-17 ENCOUNTER — Encounter: Payer: Self-pay | Admitting: Endocrinology

## 2017-06-17 ENCOUNTER — Other Ambulatory Visit: Payer: Self-pay

## 2017-06-17 MED ORDER — OLMESARTAN MEDOXOMIL 20 MG PO TABS: 20.0000 mg | ORAL_TABLET | Freq: Every day | ORAL | 3 refills | Status: DC

## 2017-09-09 ENCOUNTER — Encounter: Payer: Self-pay | Admitting: Endocrinology

## 2017-09-20 ENCOUNTER — Other Ambulatory Visit: Payer: Self-pay

## 2017-09-20 MED ORDER — SYNTHROID 25 MCG PO TABS: 25.0000 ug | ORAL_TABLET | Freq: Every day | ORAL | 2 refills | Status: DC

## 2018-02-25 ENCOUNTER — Telehealth: Payer: Self-pay | Admitting: Endocrinology

## 2018-02-25 DIAGNOSIS — R69 Illness, unspecified: Secondary | ICD-10-CM | POA: Diagnosis not present

## 2018-02-25 NOTE — Telephone Encounter (Signed)
[facility] Falling ph# [phone] called re: needing PA for Olmesartan-patient initiated PA but [facility] Falling has follow up questions. Please call the above number to assist-they will also send fax

## 2018-02-25 NOTE — Telephone Encounter (Signed)
olmesartan (BENICAR) 20 MG tablet  Aurea Graff is calling about PA for patients medication. They have some questions about this. They also will be faxing over paperwork to our office for the patients medication   [phone]

## 2018-02-26 ENCOUNTER — Other Ambulatory Visit: Payer: Self-pay

## 2018-02-26 MED ORDER — IRBESARTAN 150 MG PO TABS: 150.0000 mg | ORAL_TABLET | Freq: Every day | ORAL | 3 refills | Status: DC

## 2018-02-26 MED ORDER — OLMESARTAN MEDOXOMIL 20 MG PO TABS: 20.0000 mg | ORAL_TABLET | Freq: Every day | ORAL | 11 refills | Status: DC

## 2018-02-26 NOTE — Telephone Encounter (Signed)
I have sent a prescription to your pharmacy, to change to irbesartan.

## 2018-02-26 NOTE — Telephone Encounter (Signed)
Patient refused to take another medication & I called [employer] back. I was able to get medication approved until 11/04/2018. I have called and informed patient of this & will be sending in new prescription to pharmacy.

## 2018-02-26 NOTE — Telephone Encounter (Signed)
When I called [facility] Falling they first asked had patient tried these tier 1 medications & I can't see where patient has: Ibesartan, Losartan, Valsartan or Amlodipine. They prefer that patient try alternative before completing PA. Please advise?

## 2018-03-07 ENCOUNTER — Ambulatory Visit (INDEPENDENT_AMBULATORY_CARE_PROVIDER_SITE_OTHER): Payer: Medicare HMO | Admitting: Endocrinology

## 2018-03-07 ENCOUNTER — Encounter: Payer: Self-pay | Admitting: Endocrinology

## 2018-03-07 VITALS — BP 156/82 | HR 66 | Wt 141.2 lb

## 2018-03-07 DIAGNOSIS — Z0001 Encounter for general adult medical examination with abnormal findings: Secondary | ICD-10-CM

## 2018-03-07 DIAGNOSIS — M810 Age-related osteoporosis without current pathological fracture: Secondary | ICD-10-CM | POA: Diagnosis not present

## 2018-03-07 DIAGNOSIS — R739 Hyperglycemia, unspecified: Secondary | ICD-10-CM

## 2018-03-07 DIAGNOSIS — E042 Nontoxic multinodular goiter: Secondary | ICD-10-CM

## 2018-03-07 DIAGNOSIS — H6691 Otitis media, unspecified, right ear: Secondary | ICD-10-CM | POA: Diagnosis not present

## 2018-03-07 DIAGNOSIS — Z0000 Encounter for general adult medical examination without abnormal findings: Secondary | ICD-10-CM

## 2018-03-07 DIAGNOSIS — E785 Hyperlipidemia, unspecified: Secondary | ICD-10-CM

## 2018-03-07 DIAGNOSIS — I10 Essential (primary) hypertension: Secondary | ICD-10-CM

## 2018-03-07 LAB — URINALYSIS, ROUTINE W REFLEX MICROSCOPIC
BILIRUBIN URINE: NEGATIVE
Ketones, ur: NEGATIVE
Leukocytes, UA: NEGATIVE
NITRITE: NEGATIVE
PH: 6 (ref 5.0–8.0)
Specific Gravity, Urine: 1.015 (ref 1.000–1.030)
TOTAL PROTEIN, URINE-UPE24: NEGATIVE
URINE GLUCOSE: NEGATIVE
Urobilinogen, UA: 0.2 (ref 0.0–1.0)

## 2018-03-07 LAB — CBC WITH DIFFERENTIAL/PLATELET
BASOS ABS: 0.1 10*3/uL (ref 0.0–0.1)
Basophils Relative: 1.2 % (ref 0.0–3.0)
EOS ABS: 0.1 10*3/uL (ref 0.0–0.7)
EOS PCT: 2.2 % (ref 0.0–5.0)
HCT: 42.3 % (ref 36.0–46.0)
Hemoglobin: 14.2 g/dL (ref 12.0–15.0)
LYMPHS ABS: 1.7 10*3/uL (ref 0.7–4.0)
Lymphocytes Relative: 26 % (ref 12.0–46.0)
MCHC: 33.5 g/dL (ref 30.0–36.0)
MCV: 90.1 fl (ref 78.0–100.0)
MONO ABS: 0.5 10*3/uL (ref 0.1–1.0)
Monocytes Relative: 7.4 % (ref 3.0–12.0)
NEUTROS ABS: 4.2 10*3/uL (ref 1.4–7.7)
Neutrophils Relative %: 63.2 % (ref 43.0–77.0)
Platelets: 277 10*3/uL (ref 150.0–400.0)
RBC: 4.69 Mil/uL (ref 3.87–5.11)
RDW: 13.6 % (ref 11.5–15.5)
WBC: 6.6 10*3/uL (ref 4.0–10.5)

## 2018-03-07 LAB — LIPID PANEL
CHOLESTEROL: 195 mg/dL (ref 0–200)
HDL: 60.7 mg/dL (ref >39.00)
LDL Cholesterol: 108 mg/dL — ABNORMAL HIGH (ref 0–99)
NONHDL: 134.32
Total CHOL/HDL Ratio: 3
Triglycerides: 130 mg/dL (ref 0.0–149.0)
VLDL: 26 mg/dL (ref 0.0–40.0)

## 2018-03-07 LAB — HEPATIC FUNCTION PANEL
ALK PHOS: 76 U/L (ref 39–117)
ALT: 17 U/L (ref 0–35)
AST: 18 U/L (ref 0–37)
Albumin: 4.4 g/dL (ref 3.5–5.2)
BILIRUBIN DIRECT: 0.1 mg/dL (ref 0.0–0.3)
BILIRUBIN TOTAL: 0.6 mg/dL (ref 0.2–1.2)
Total Protein: 7.1 g/dL (ref 6.0–8.3)

## 2018-03-07 LAB — TSH: TSH: 1.81 u[IU]/mL (ref 0.35–4.50)

## 2018-03-07 LAB — BASIC METABOLIC PANEL
BUN: 13 mg/dL (ref 6–23)
CO2: 26 mEq/L (ref 19–32)
CREATININE: 0.73 mg/dL (ref 0.40–1.20)
Calcium: 9.2 mg/dL (ref 8.4–10.5)
Chloride: 106 mEq/L (ref 96–112)
GFR: 82.61 mL/min (ref >60.00)
GLUCOSE: 101 mg/dL — AB (ref 70–99)
Potassium: 4.5 mEq/L (ref 3.5–5.1)
Sodium: 141 mEq/L (ref 135–145)

## 2018-03-07 LAB — VITAMIN D 25 HYDROXY (VIT D DEFICIENCY, FRACTURES): VITD: 32.59 ng/mL (ref 30.00–100.00)

## 2018-03-07 LAB — HEMOGLOBIN A1C: HEMOGLOBIN A1C: 5.6 % (ref 4.6–6.5)

## 2018-03-07 MED ORDER — AZITHROMYCIN 500 MG PO TABS: 500.0000 mg | ORAL_TABLET | Freq: Every day | ORAL | 0 refills | Status: DC

## 2018-03-07 NOTE — Patient Instructions (Addendum)
I have sent a prescription to your pharmacy, for an antibiotic pill. Loratadine-d (non-prescription) will help your congestion.  Please consider these measures for your health:  minimize alcohol.  Do not use tobacco products.  Have a colonoscopy at least every 10 years from age 75.  Women should have an annual mammogram from age 51.  Keep firearms safely stored.  Always use seat belts.  have working smoke alarms in your home.  See an eye doctor and dentist regularly.  Never drive under the influence of alcohol or drugs (including prescription drugs).  Those with fair skin should take precautions against the sun, and should carefully examine their skin once per month, for any new or changed moles. good diet and exercise significantly improve your health.  please let me know if you wish to be referred to a dietician.  high blood sugar is very risky to your health.  you should see an eye doctor and dentist every year.  It is very important to get all recommended vaccinations.  please let me know what your wishes would be, if artificial life support measures should become necessary.  It is critically important to prevent falling down (keep floor areas well-lit, dry, and free of loose objects.  If you have a cane, walker, or wheelchair, you should use it, even for short trips around the house.  Wear flat-soled shoes.  Also, try not to rush) blood tests are requested for you today.  We'll let you know about the results.  Please come back for a follow-up appointment in 1 year.

## 2018-03-07 NOTE — Progress Notes (Signed)
we discussed code status.  pt requests full code, but would not want to be started or maintained on artificial life-support measures if there was not a reasonable chance of recovery 

## 2018-03-07 NOTE — Progress Notes (Signed)
Subjective:    Patient ID: Donna Andersen, female    DOB: 1941-02-05, 77 y.o.   MRN: [id number]  HPI Pt is here for regular wellness examination, and is feeling pretty well in general, and says chronic med probs are stable, except as noted below.  Past Medical History:  Diagnosis Date  . Acute cystitis 09/26/2007   recurrent UTI's  . Allergy   . COUGH DUE TO ACE INHIBITORS 06/20/2009  . 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 GOITER, MULTINODULAR 10/11/2009   Benign L thyroid nodule  . Headache(784.0) 06/08/2008  . HEMATURIA UNSPECIFIED 10/05/2008  . HYPERGLYCEMIA 09/26/2007  . HYPERLIPIDEMIA 07/21/2007  . HYPERTENSION 11/22/2008  . HYPOTHYROIDISM 07/21/2007  . INSOMNIA 06/08/2008  . MYALGIA 10/23/2010  . OSTEOPOROSIS 07/21/2007  . WEIGHT GAIN 06/08/2008    Past Surgical History:  Procedure Laterality Date  . ABDOMINAL HYSTERECTOMY  1974  . BREAST LUMPECTOMY  1976   benign  . COLONOSCOPY    . POLYPECTOMY    . THYROID SURGERY  2011   left nodule removed  . VAGINAL DELIVERY     x1    Social History   Socioeconomic History  . Marital status: Married    Spouse name: Not on file  . Number of children: Not on file  . Years of education: Not on file  . Highest education level: Not on file  Occupational History  . Occupation: Scientist, research (physical sciences): RETIRED  Social Needs  . Financial resource strain: Not on file  . Food insecurity:    Worry: Not on file    Inability: Not on file  . Transportation needs:    Medical: Not on file    Non-medical: Not on file  Tobacco Use  . Smoking status: Never Smoker  . Smokeless tobacco: Never Used  Substance and Sexual Activity  . Alcohol use: Yes    Alcohol/week: 0.0 oz    Comment: socially-2 glasses a week  . Drug use: No  . Sexual activity: Not on file  Lifestyle  . Physical activity:    Days per week: Not on file    Minutes per session: Not on file  . Stress: Not on file  Relationships  . Social connections:    Talks on  phone: Not on file    Gets together: Not on file    Attends religious service: Not on file    Active member of club or organization: Not on file    Attends meetings of clubs or organizations: Not on file    Relationship status: Not on file  . Intimate partner violence:    Fear of current or ex partner: Not on file    Emotionally abused: Not on file    Physically abused: Not on file    Forced sexual activity: Not on file  Other Topics Concern  . Not on file  Social History Narrative   Does not work outside the home    Current Outpatient Medications on File Prior to Visit  Medication Sig Dispense Refill  . calcium carbonate (OS-CAL - DOSED IN MG OF ELEMENTAL CALCIUM) 1250 MG tablet Take 1 tablet by mouth daily.      . cholecalciferol (VITAMIN D) 1000 UNITS tablet Take 1,000 Units by mouth daily.      Marland Kitchen olmesartan (BENICAR) 20 MG tablet Take 1 tablet (20 mg total) by mouth daily. 30 tablet 11  . rosuvastatin (CRESTOR) 5 MG tablet Take 1 tablet (5 mg total) by mouth daily. (Patient  taking differently: Take 5 mg by mouth daily. ) 90 tablet 3  . SYNTHROID 25 MCG tablet Take 1 tablet (25 mcg total) daily before breakfast by mouth. 90 tablet 2  . Coenzyme Q10 (CO Q 10) 100 MG CAPS Take 100 mg by mouth daily.     No current facility-administered medications on file prior to visit.     Allergies  Allergen Reactions  . Atorvastatin     REACTION: dental pain, myalgias, and headache  . Lisinopril     REACTION: cough  . Losartan Potassium     REACTION: drowsiness  . Sulfonamide Derivatives     REACTION: Rash, Hives, Asthma  . Latex Itching and Rash     scars  . Other Rash and Hives    shrimp. shrimp - Anaphylaxis (swelling & hives) Tape. Tape - scars, itching from latex tape    Family History  Problem Relation Age of Onset  . Cancer Mother        Ovarian Cancer  . Ovarian cancer Mother   . Lymphoma Sister   . Cancer Sister        Breast Cancer, Kidney Cancer  . Leukemia Sister    . Colon cancer Neg Hx   . Rectal cancer Neg Hx   . Stomach cancer Neg Hx     BP (!) 156/82 (BP Location: Left Arm, Patient Position: Sitting, Cuff Size: Normal)   Pulse 66   Wt 141 lb 3.2 oz (64 kg)   SpO2 94%   BMI 27.58 kg/m    Review of Systems Denies fatigue, diplopia, itching of the ears, chest pain, sob, back pain, insomnia, cold intolerance, BRBPR, hematuria, syncope, numbness, easy bruising, and rash.  She has chronic rhinorrhea.    Objective:   Physical Exam VS: see vs page GEN: no distress HEAD: head: no deformity eyes: no periorbital swelling, no proptosis external nose and ears are normal mouth: no lesion seen NECK: a healed scar is present.  I do not appreciate a nodule in the thyroid or elsewhere in the neck CHEST WALL: no deformity LUNGS:  Clear to auscultation CV: reg rate and rhythm, no murmur ABD: abdomen is soft, nontender.  no hepatosplenomegaly.  not distended.  no hernia MUSCULOSKELETAL: muscle bulk and strength are grossly normal.  no obvious joint swelling.  gait is normal and steady EXTEMITIES: no deformity.  no ulcer on the feet.  feet are of normal color and temp.  no edema PULSES: dorsalis pedis intact bilat.  no carotid bruit NEURO:  cn 2-12 grossly intact.   readily moves all 4's.  sensation is intact to touch on the feet SKIN:  Normal texture and temperature.  No rash or suspicious lesion is visible.   NODES:  None palpable at the neck PSYCH: alert, well-oriented.  Does not appear anxious nor depressed.      Assessment & Plan:  Wellness visit today, with problems stable, except as noted.  SEPARATE EVALUATION FOLLOWS--EACH PROBLEM HERE IS NEW, NOT RESPONDING TO TREATMENT, OR POSES SIGNIFICANT RISK TO THE PATIENT'S HEALTH: HISTORY OF THE PRESENT ILLNESS: 3 days of moderate pain at the right ear, and assoc nasal congestion.   PAST MEDICAL HISTORY Past Medical History:  Diagnosis Date  . Acute cystitis 09/26/2007   recurrent UTI's  .  Allergy   . COUGH DUE TO ACE INHIBITORS 06/20/2009  . 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 GOITER, MULTINODULAR 10/11/2009   Benign L thyroid nodule  . Headache(784.0) 06/08/2008  . HEMATURIA UNSPECIFIED 10/05/2008  .  HYPERGLYCEMIA 09/26/2007  . HYPERLIPIDEMIA 07/21/2007  . HYPERTENSION 11/22/2008  . HYPOTHYROIDISM 07/21/2007  . INSOMNIA 06/08/2008  . MYALGIA 10/23/2010  . OSTEOPOROSIS 07/21/2007  . WEIGHT GAIN 06/08/2008    Past Surgical History:  Procedure Laterality Date  . ABDOMINAL HYSTERECTOMY  1974  . BREAST LUMPECTOMY  1976   benign  . COLONOSCOPY    . POLYPECTOMY    . THYROID SURGERY  2011   left nodule removed  . VAGINAL DELIVERY     x1    Social History   Socioeconomic History  . Marital status: Married    Spouse name: Not on file  . Number of children: Not on file  . Years of education: Not on file  . Highest education level: Not on file  Occupational History  . Occupation: Scientist, research (physical sciences): RETIRED  Social Needs  . Financial resource strain: Not on file  . Food insecurity:    Worry: Not on file    Inability: Not on file  . Transportation needs:    Medical: Not on file    Non-medical: Not on file  Tobacco Use  . Smoking status: Never Smoker  . Smokeless tobacco: Never Used  Substance and Sexual Activity  . Alcohol use: Yes    Alcohol/week: 0.0 oz    Comment: socially-2 glasses a week  . Drug use: No  . Sexual activity: Not on file  Lifestyle  . Physical activity:    Days per week: Not on file    Minutes per session: Not on file  . Stress: Not on file  Relationships  . Social connections:    Talks on phone: Not on file    Gets together: Not on file    Attends religious service: Not on file    Active member of club or organization: Not on file    Attends meetings of clubs or organizations: Not on file    Relationship status: Not on file  . Intimate partner violence:    Fear of current or ex partner: Not on file    Emotionally abused: Not  on file    Physically abused: Not on file    Forced sexual activity: Not on file  Other Topics Concern  . Not on file  Social History Narrative   Does not work outside the home    Current Outpatient Medications on File Prior to Visit  Medication Sig Dispense Refill  . calcium carbonate (OS-CAL - DOSED IN MG OF ELEMENTAL CALCIUM) 1250 MG tablet Take 1 tablet by mouth daily.      . cholecalciferol (VITAMIN D) 1000 UNITS tablet Take 1,000 Units by mouth daily.      Marland Kitchen olmesartan (BENICAR) 20 MG tablet Take 1 tablet (20 mg total) by mouth daily. 30 tablet 11  . rosuvastatin (CRESTOR) 5 MG tablet Take 1 tablet (5 mg total) by mouth daily. (Patient taking differently: Take 5 mg by mouth daily. ) 90 tablet 3  . SYNTHROID 25 MCG tablet Take 1 tablet (25 mcg total) daily before breakfast by mouth. 90 tablet 2  . Coenzyme Q10 (CO Q 10) 100 MG CAPS Take 100 mg by mouth daily.     No current facility-administered medications on file prior to visit.     Allergies  Allergen Reactions  . Atorvastatin     REACTION: dental pain, myalgias, and headache  . Lisinopril     REACTION: cough  . Losartan Potassium     REACTION: drowsiness  . Sulfonamide Derivatives  REACTION: Rash, Hives, Asthma  . Latex Itching and Rash     scars  . Other Rash and Hives    shrimp. shrimp - Anaphylaxis (swelling & hives) Tape. Tape - scars, itching from latex tape    Family History  Problem Relation Age of Onset  . Cancer Mother        Ovarian Cancer  . Ovarian cancer Mother   . Lymphoma Sister   . Cancer Sister        Breast Cancer, Kidney Cancer  . Leukemia Sister   . Colon cancer Neg Hx   . Rectal cancer Neg Hx   . Stomach cancer Neg Hx     BP (!) 156/82 (BP Location: Left Arm, Patient Position: Sitting, Cuff Size: Normal)   Pulse 66   Wt 141 lb 3.2 oz (64 kg)   SpO2 94%   BMI 27.58 kg/m   REVIEW OF SYSTEMS: She has lost a few lbs, due to her efforts.  Denies fever PHYSICAL  EXAMINATION: VITAL SIGNS:  See vs page GENERAL: no distress Ears: both TM's are red LAB/XRAY RESULTS: I personally reviewed electrocardiogram tracing (today): Indication: HTN Impression: NSR with PVC.  No MI.  LVH Compared to 2017: changes are new.  IMPRESSION: AOM: new HTN: she needs increased rx Abnormal ecg, poss due to HTN PLAN:  I rx'ed abx Increase benicar to 40 mg qd We'll recheck ecg net year, to see if increased benicar helps  Subjective:   Patient here for Medicare annual wellness visit and management of other chronic and acute problems.     Risk factors: advanced age    34 of Physicians Providing Medical Care to Patient:  See "snapshot"   Activities of Daily Living: In your present state of health, do you have any difficulty performing the following activities (lives with husband)?:  Preparing food and eating?: No  Bathing yourself: No  Getting dressed: No  Using the toilet:No  Moving around from place to place: No  In the past year have you fallen or had a near fall?: No    Home Safety: Has smoke detector and wears seat belts. No firearms. No excess sun exposure.    Opioid Use: none   Diet and Exercise  Current exercise habits: pt says good Dietary issues discussed: pt reports a healthy diet.   Depression Screen  Q1: Over the past two weeks, have you felt down, depressed or hopeless? no  Q2: Over the past two weeks, have you felt little interest or pleasure in doing things? no   The following portions of the patient's history were reviewed and updated as appropriate: allergies, current medications, past family history, past medical history, past social history, past surgical history and problem list.   Review of Systems  Denies hearing loss, and visual loss Objective:   Vision:  Advertising account executive, so she declines VA today Hearing: grossly normal Body mass index:  See vs page Msk: pt easily and quickly performs "get-up-and-go" from a sitting  position Cognitive Impairment Assessment: cognition, memory and judgment appear normal.  remembers 3/3 at 5 minutes.  excellent recall.  can easily read and write a sentence.  alert and oriented x 3.     Assessment:   Medicare wellness utd on preventive parameters.     Plan:   During the course of the visit the patient was educated and counseled about appropriate screening and preventive services including:        Fall prevention is advised today.  Screening mammography is UTD (done at [facility]).   Bone densitometry screening is ordered today Diabetes screening is UTD Nutrition counseling is offered  advanced directives/end of life addressed today:  see healthcare directives hyperlink.   Vaccines are updated as needed.    Patient Instructions (the written plan) was given to the patient.

## 2018-03-09 ENCOUNTER — Encounter: Payer: Self-pay | Admitting: Endocrinology

## 2018-03-09 MED ORDER — OLMESARTAN MEDOXOMIL 40 MG PO TABS: 40.0000 mg | ORAL_TABLET | Freq: Every day | ORAL | 3 refills | Status: DC

## 2018-03-11 LAB — PTH, INTACT AND CALCIUM
Calcium: 9.2 mg/dL (ref 8.6–10.4)
PTH: 51 pg/mL (ref 14–64)

## 2018-04-14 ENCOUNTER — Ambulatory Visit (INDEPENDENT_AMBULATORY_CARE_PROVIDER_SITE_OTHER)
Admission: RE | Admit: 2018-04-14 | Discharge: 2018-04-14 | Disposition: A | Discharge status: Home / Self Care | Payer: Medicare HMO | Source: Ambulatory Visit | Attending: Endocrinology | Admitting: Endocrinology

## 2018-04-14 DIAGNOSIS — M810 Age-related osteoporosis without current pathological fracture: Secondary | ICD-10-CM | POA: Diagnosis not present

## 2018-04-23 DIAGNOSIS — H5203 Hypermetropia, bilateral: Secondary | ICD-10-CM | POA: Diagnosis not present

## 2018-04-23 DIAGNOSIS — H25013 Cortical age-related cataract, bilateral: Secondary | ICD-10-CM | POA: Diagnosis not present

## 2018-04-23 DIAGNOSIS — D3131 Benign neoplasm of right choroid: Secondary | ICD-10-CM | POA: Diagnosis not present

## 2018-06-09 DIAGNOSIS — Z0100 Encounter for examination of eyes and vision without abnormal findings: Secondary | ICD-10-CM | POA: Diagnosis not present

## 2018-06-27 ENCOUNTER — Encounter: Payer: Self-pay | Admitting: Family Medicine

## 2018-06-27 ENCOUNTER — Ambulatory Visit (INDEPENDENT_AMBULATORY_CARE_PROVIDER_SITE_OTHER): Payer: Medicare HMO | Admitting: Family Medicine

## 2018-06-27 VITALS — BP 190/78 | HR 59 | Temp 97.9°F | Ht 60.0 in | Wt 143.2 lb

## 2018-06-27 DIAGNOSIS — M8580 Other specified disorders of bone density and structure, unspecified site: Secondary | ICD-10-CM

## 2018-06-27 DIAGNOSIS — E785 Hyperlipidemia, unspecified: Secondary | ICD-10-CM

## 2018-06-27 DIAGNOSIS — E890 Postprocedural hypothyroidism: Secondary | ICD-10-CM | POA: Diagnosis not present

## 2018-06-27 DIAGNOSIS — Z9109 Other allergy status, other than to drugs and biological substances: Secondary | ICD-10-CM | POA: Insufficient documentation

## 2018-06-27 DIAGNOSIS — I10 Essential (primary) hypertension: Secondary | ICD-10-CM

## 2018-06-27 MED ORDER — HYDROCHLOROTHIAZIDE 12.5 MG PO CAPS: 12.5000 mg | ORAL_CAPSULE | Freq: Every day | ORAL | 0 refills | Status: DC

## 2018-06-27 NOTE — Progress Notes (Signed)
Patient: Donna Andersen MRN: [id number] DOB: 10-23-1941 PCP: Orma Flaming, MD     Subjective:  Chief Complaint  Patient presents with  . Hypertension  . Hypothyroidism  . Hyperlipidemia    HPI: The patient is a 77 y.o. female who presents today for establishing care, follow up on hypertension. Other medical history includes hypothyroidism, hyperlipidemia and osteoporosis. She had her AWV in 03/2018.   Dexa: done 04/2018. Repeat in 3 years Colonoscopy: 03/2015. Repeat in 5 years.  Mmg: done at [facility]. 09/2017 Pneumonia shots done  Hypertension: Here for follow up of hypertension.  Currently on benicar 40mg . She takes this at night.  Home readings range from 505-397 QBHALPFX/90-24 diastolic. Takes medication as prescribed and denies any side effects. Exercise includes pickleball daily. Weight has been stable. Denies any chest pain, headaches, shortness of breath, vision changes, swelling in lower extremities. She was just increased to 40mg  in May. Her home readings are always to goal.   Hypothyroid: she had a goiter and had surgery. They only removed 1/2 of her thyroid. [facility] told her to come off medication completely and Dr. Loanne Drilling told her to keep taking it so not to overwork the other side of her thyroid. Her recent labs were normal and she has not been taking her medication and would rather not take it. tsh normal in 03/2018.   Hyperlipidemia: cholesterol was checked 03/2018 and was all to goal. She takes crestor about 3 nights a week. Was intolerate of all other statins. She was started on medication really high and was started on medication awhile back. +FH of MI in her father at 30. He was a heavy smoker. She does not smoke, no diabetes. Does have HTN.   Osteopenia: on calcium and vitamin D. BMD in 04/2018 was improved and tscore much improved.   Review of Systems  Constitutional: Negative for appetite change, chills, fatigue and fever.  HENT: Negative for dental problem, ear pain,  hearing loss and trouble swallowing.   Eyes: Negative for visual disturbance.  Respiratory: Negative for cough, chest tightness and shortness of breath.   Cardiovascular: Negative for chest pain, palpitations and leg swelling.  Gastrointestinal: Negative for abdominal pain, blood in stool, diarrhea and nausea.  Endocrine: Negative for cold intolerance, polydipsia, polyphagia and polyuria.  Genitourinary: Negative for dysuria and hematuria.  Musculoskeletal: Negative for arthralgias, back pain and neck pain.  Skin: Negative for rash.  Neurological: Negative for dizziness and headaches.  Psychiatric/Behavioral: Negative for dysphoric mood and sleep disturbance. The patient is not nervous/anxious.     Allergies Patient is allergic to bee venom; atorvastatin; lisinopril; losartan potassium; sulfonamide derivatives; latex; and other.  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  Surgical History Patient  has a past surgical history that includes Abdominal hysterectomy (1974); Thyroid surgery (2011); Breast lumpectomy (1976); Vaginal delivery; Colonoscopy; and Polypectomy.  Family History Pateint's family history includes Cancer in her mother and sister; Hypertension in her mother; Leukemia in her sister; Lymphoma in her sister; Ovarian cancer in her mother; Thyroid disease in her mother.  Social History Patient  reports that she has never smoked. She has never used smokeless tobacco. She reports that she drinks alcohol. She reports that she does not use drugs.  Objective: Vitals:   06/27/18 0843 06/27/18 0921  BP: (!) 150/94 (!) 190/78  Pulse: (!) 59   Temp: 97.9 F (36.6 C)   TempSrc: Oral   SpO2: 97%   Weight: 143 lb 3.2 oz (65 kg)   Height: 5' (1.524 m)     Body mass index is 27.97 kg/m.  Physical Exam  Constitutional: She is oriented to person, place, and time. She appears well-developed and well-nourished.  HENT:  Right Ear: External ear normal.  Left Ear: External ear normal.  Mouth/Throat: Oropharynx is clear and moist.  Eyes: Pupils are equal, round, and reactive to light. Conjunctivae and EOM are normal.  Neck: Normal range of motion. Neck supple. No thyromegaly present.  Cardiovascular: Normal rate, regular rhythm, normal heart sounds and intact distal pulses.  No murmur heard. Pulmonary/Chest: Effort normal and breath sounds normal.  Abdominal: Soft. Bowel sounds are normal. She exhibits no distension. There is no tenderness.  Lymphadenopathy:    She has no cervical adenopathy.  Neurological: She is alert and oriented to person, place, and time. She displays normal reflexes. No cranial nerve deficit. Coordination normal.  Skin: Skin is warm and dry. No rash noted.  Psychiatric: She has a normal mood and affect. Her behavior is normal.  Vitals reviewed.      Assessment/plan: 1. Essential hypertension Way above goal on numerous repeats as well. We are going to add on hctz and see her back in 1 weeks for recheck. Continue home log and bring her blood pressure cuff with her on next appointment. If still elevated, will increase the hctz to 25mg  and have her come back for labs/repeat. Discussed side effect of medication and will need to follow electrolytes. Also want to bring her down slowly as she has described intolerance to past blood pressure pills due to drowsiness. Side effects of uncontrolled HTN discussed and discussed 190-200+ is dangerous and risk of adverse event is high (CVA/MI). Precautions given.   2.  Postsurgical hypothyroidism Off medication and her TSH was to goal. Would continue to NOT take medication and we will see where she is at on her next lab draw. Come in sooner if feeling symptomatic. Agree with [facility] that she doesn't need since she still has some of her thyroid that can regulate her hormones.   3. Dyslipidemia Cholesterol very well controlled and to goal. Continue current medication and will recheck at her annual follow up. No refills needed at this time.   4. Osteopenia, unspecified location Continue calcium and vitamin D. Had improvement in her BMD and tscore with increased weight bearing exercises. Has no desire to start medication even though FRAX score is >3% for her hip. Unable to tolerate bisphosphonate's. Doesn't want any medication so discussed may not be cost effective or worth it to her to continue DEXA scans if she has no desire to start medication.    Return in about 1 week (around 07/04/2018) for bp check with nurse. Orma Flaming, MD [place]  06/27/2018

## 2018-06-27 NOTE — Progress Notes (Deleted)
Patient: Donna Andersen MRN: [id number] DOB: 28-Oct-1941 PCP: Renato Shin, MD     Subjective:  No chief complaint on file.   HPI: The patient is a 77 y.o. female who presents today for ***  Review of Systems  Allergies Patient is allergic to atorvastatin; lisinopril; losartan potassium; sulfonamide derivatives; latex; and other.  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  Surgical History Patient  has a past surgical history that includes Abdominal hysterectomy (1974); Thyroid surgery (2011); Breast lumpectomy (1976); Vaginal delivery; Colonoscopy; and Polypectomy.  Family History Pateint's family history includes Cancer in her mother and sister; Leukemia in her sister; Lymphoma in her sister; Ovarian cancer in her mother.  Social History Patient  reports that she has never smoked. She has never used smokeless tobacco. She reports that she drinks alcohol. She reports that she does not use drugs.    Objective: There were no vitals filed for this visit.  There is no height or weight on file to calculate BMI.  Physical Exam     Assessment/plan:      No follow-ups on file.     @AWME @ 06/27/2018

## 2018-07-04 ENCOUNTER — Ambulatory Visit (INDEPENDENT_AMBULATORY_CARE_PROVIDER_SITE_OTHER): Payer: Medicare HMO | Admitting: Surgical

## 2018-07-04 ENCOUNTER — Encounter: Payer: Self-pay | Admitting: Surgical

## 2018-07-04 VITALS — BP 180/84 | HR 64

## 2018-07-04 DIAGNOSIS — I10 Essential (primary) hypertension: Secondary | ICD-10-CM | POA: Diagnosis not present

## 2018-07-04 NOTE — Progress Notes (Addendum)
Patient comes in today for a BP check. Patient was started on Hydrochlorothiazide 12.5 mg on 06/27/18. She has also been taking her Benicar as prescribed. Patients BP in left arm today was 176/88 and right arm was 180/84. Patient brought in her BP machine from home and the BP on her machine was 167/79 in the left arm. Her BP readings at home has since Wednesday 07/02/18 has been running in the 120's / 70's. I dicussed BP reading in the office today with Dr. Yong Channel and he was fine with readings today. I released patient to go home.  I have placed patients BP readings from home on Dr. Samuel Germany desk for her to review when she is back in the office.   Addendum by Dr. Yong Channel: patient was asymptomatic. Home cuff reasonably close to our readings- with excellent home control suspect white coat hypertension. I asked patient to be told to follow up with Dr. Rogers Blocker next week.  Garret Reddish

## 2018-07-09 ENCOUNTER — Telehealth: Payer: Self-pay | Admitting: Family Medicine

## 2018-07-09 NOTE — Telephone Encounter (Signed)
Let her know BP readings are better. Thanks for dropping off. Also her heart rate is fine. Normal is 60-80. Both of her blood pressure medication have no affect on her heart rate either.   i'll see her soon!   aw

## 2018-07-10 ENCOUNTER — Telehealth: Payer: Self-pay | Admitting: *Deleted

## 2018-07-10 NOTE — Telephone Encounter (Signed)
Copied from [place] [id number]. Topic: General - Other >> Jul 10, 2018 10:35 AM Keene Breath wrote: Reason for CRM: Patient called to speak specifically to the nurse about one of her prescriptions.  Please advise and call patient back after 12 noon.  XT#[phone].

## 2018-07-10 NOTE — Telephone Encounter (Signed)
Called and left voicemail msg on patient's cell phone requesting call back.  CRM created

## 2018-07-10 NOTE — Telephone Encounter (Signed)
Called and spoke with patient and she had question in regard to her bp med-HCTZ.  Wanted to know if she was supposed to continue taking this med after the 30 days that it was prescribed for.  Advised patient I will check with Dr. Rogers Blocker and send pt a msg via MyChart after I have an answer.

## 2018-07-10 NOTE — Telephone Encounter (Signed)
Called and spoke with patient and advised her that all bp readings that she dropped off for Dr. Rogers Blocker last week were fine.  Per Dr. Rogers Blocker, her heart rate is fine also.  Neither of meds that she is taking will affect her heart rate per Dr. Rogers Blocker

## 2018-07-21 ENCOUNTER — Other Ambulatory Visit: Payer: Self-pay | Admitting: Family Medicine

## 2018-07-21 ENCOUNTER — Ambulatory Visit (INDEPENDENT_AMBULATORY_CARE_PROVIDER_SITE_OTHER): Payer: Medicare HMO | Admitting: Family Medicine

## 2018-07-21 ENCOUNTER — Encounter: Payer: Self-pay | Admitting: Family Medicine

## 2018-07-21 VITALS — BP 148/88 | HR 67 | Temp 98.0°F | Ht 60.0 in | Wt 141.4 lb

## 2018-07-21 DIAGNOSIS — I10 Essential (primary) hypertension: Secondary | ICD-10-CM | POA: Diagnosis not present

## 2018-07-21 LAB — BASIC METABOLIC PANEL
BUN: 20 mg/dL (ref 6–23)
CO2: 30 meq/L (ref 19–32)
Calcium: 10 mg/dL (ref 8.4–10.5)
Chloride: 100 mEq/L (ref 96–112)
Creatinine, Ser: 0.83 mg/dL (ref 0.40–1.20)
GFR: 71.17 mL/min (ref >60.00)
Glucose, Bld: 97 mg/dL (ref 70–99)
POTASSIUM: 4.2 meq/L (ref 3.5–5.1)
SODIUM: 137 meq/L (ref 135–145)

## 2018-07-21 MED ORDER — HYDROCHLOROTHIAZIDE 12.5 MG PO CAPS
12.5000 mg | ORAL_CAPSULE | Freq: Every day | ORAL | 3 refills | Status: DC
Start: 2018-07-21 — End: 2019-08-17

## 2018-07-21 NOTE — Progress Notes (Signed)
Patient: Donna Andersen MRN: [id number] DOB: 05/09/1941 PCP: Orma Flaming, MD     Subjective:  Chief Complaint  Patient presents with  . Follow-up  . Hypertension    HPI: The patient is a 77 y.o. female who presents today for hypertension.   Hypertension: Here for follow up of hypertension.  Currently on benicar and I added a low dose hctz at last visit. Home readings range from 371-696 VELFYBOF/75-10 diastolic. Takes medication as prescribed and denies any side effects. Exercise includes walking. Weight has been stable. Denies any chest pain, headaches, shortness of breath, vision changes, swelling in lower extremities. She does urinate more with the diuretic, but otherwise is tolerating well.    Review of Systems  Constitutional: Negative for fatigue.  Respiratory: Negative for shortness of breath.   Cardiovascular: Negative for chest pain.  Gastrointestinal: Negative for abdominal distention and nausea.  Neurological: Negative for dizziness and headaches.  Psychiatric/Behavioral: Negative for sleep disturbance. The patient is not nervous/anxious.     Allergies Patient is allergic to bee venom; atorvastatin; lisinopril; losartan potassium; sulfonamide derivatives; latex; and other.  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  Surgical History Patient  has a past surgical history that includes Abdominal hysterectomy (1974); Thyroid surgery (2011); Breast lumpectomy (1976); Vaginal delivery; Colonoscopy; and Polypectomy.  Family  History Pateint's family history includes Cancer in her mother and sister; Hypertension in her mother; Leukemia in her sister; Lymphoma in her sister; Ovarian cancer in her mother; Thyroid disease in her mother.  Social History Patient  reports that she has never smoked. She has never used smokeless tobacco. She reports that she drinks alcohol. She reports that she does not use drugs.    Objective: Vitals:   07/21/18 1130  BP: (!) 148/88  Pulse: 67  Temp: 98 F (36.7 C)  TempSrc: Oral  SpO2: 97%  Weight: 141 lb 6.4 oz (64.1 kg)  Height: 5' (1.524 m)    Body mass index is 27.62 kg/m.  Physical Exam  Constitutional: She is oriented to person, place, and time. She appears well-developed and well-nourished.  Cardiovascular: Normal rate, regular rhythm and normal heart sounds.  Pulmonary/Chest: Effort normal and breath sounds normal.  Abdominal: Soft. Bowel sounds are normal.  Neurological: She is alert and oriented to person, place, and time.  Skin: Skin is warm and dry. No rash noted.  Vitals reviewed.      Assessment/plan: 1. Essential hypertension Blood pressure is much closer to goal. Continue current anti-hypertensive medications. Refills given and routine lab work will be done today. Her home readings are wonderful and all to goal. Do not want to increase medication at this point. Checking bmp for potassium/sodium.  Recommended routine exercise and healthy diet including DASH diet and mediterranean diet. Encouraged weight loss. F/u in 6 months. Continue home log for me.  - Basic metabolic panel      Return in about 6 months (around 01/19/2019) for htn f/u .     Orma Flaming, MD [place]   07/21/2018

## 2018-07-23 ENCOUNTER — Telehealth: Payer: Self-pay

## 2018-07-23 ENCOUNTER — Telehealth: Payer: Self-pay | Admitting: Family Medicine

## 2018-07-23 NOTE — Telephone Encounter (Signed)
Received msg from [facility]- pt calling in asking if she can drop off urine sample, thinks she may have a UTI.  She is going out of town on Friday and doesn't want to come in.    I spoke with patient-she is afebrile but does c/o chills, pelvic pressure, frequency and urgency.  I advised Dr. Rogers Blocker would need to see her for appt.  Offered appt for Thurs, 9/19, pt agreed.    I advised Tylenol/Ibuprofen for discomfort, also trying a heating pad on low setting.  Push plenty of po fluids.  Pt verbalized understanding.

## 2018-07-23 NOTE — Telephone Encounter (Signed)
Copied from [place] [id number]. Topic: General - Other >> Jul 23, 2018  3:10 PM Yvette Rack wrote: Reason for CRM: pt calling wanting to know if she can drop off sample of her urine she thinks that she may have a UTI she was in the office once this week and didn't wantto come in she is going out of town on Friday her symptoms are frequent urination  pressure and chills

## 2018-07-24 ENCOUNTER — Ambulatory Visit (INDEPENDENT_AMBULATORY_CARE_PROVIDER_SITE_OTHER): Payer: Medicare HMO | Admitting: Family Medicine

## 2018-07-24 ENCOUNTER — Encounter: Payer: Self-pay | Admitting: Family Medicine

## 2018-07-24 VITALS — BP 158/86 | HR 76 | Temp 97.6°F | Ht 60.0 in | Wt 140.6 lb

## 2018-07-24 DIAGNOSIS — N3000 Acute cystitis without hematuria: Secondary | ICD-10-CM | POA: Diagnosis not present

## 2018-07-24 DIAGNOSIS — R350 Frequency of micturition: Secondary | ICD-10-CM | POA: Diagnosis not present

## 2018-07-24 LAB — POCT URINALYSIS DIPSTICK
BILIRUBIN UA: NEGATIVE
Glucose, UA: NEGATIVE
KETONES UA: NEGATIVE
Nitrite, UA: POSITIVE
PH UA: 5.5 (ref 5.0–8.0)
Protein, UA: NEGATIVE
Spec Grav, UA: 1.015 (ref 1.010–1.025)
UROBILINOGEN UA: 0.2 U/dL

## 2018-07-24 MED ORDER — NITROFURANTOIN MONOHYD MACRO 100 MG PO CAPS: 100.0000 mg | ORAL_CAPSULE | Freq: Two times a day (BID) | ORAL | 0 refills | Status: DC

## 2018-07-24 NOTE — Telephone Encounter (Signed)
Pt coming in for appt today w/Dr. Rogers Blocker at 9:15.  See telephone encounter in chart for notes

## 2018-07-24 NOTE — Progress Notes (Signed)
Patient: Donna Andersen MRN: [id number] DOB: Aug 24, 1941 PCP: Orma Flaming, MD     Subjective:  Chief Complaint  Patient presents with  . Urinary Tract Infection    HPI: The patient is a 77 y.o. female who presents today for possible UTI. She started to have symptoms about 2 days ago. She started to have chills at night, bladder spasms, and urgency with urination. She has a history of recurrent UTIs about 20 years ago and it feels the same. She has no visible blood in her urine. She denies any color change, but it does have an odor. No fever, but she has had the chills. No back pain. She also has some dysuria and some frequency. She also feels like she needs to go and can not go. She has been drinking cranberry juice and took some ibuprofen which has helped the pain.   Review of Systems  Constitutional: Positive for chills. Negative for fever.  Genitourinary: Positive for frequency, pelvic pain and urgency.       C/o burning with urination  Musculoskeletal: Negative for back pain.    Allergies Patient is allergic to bee venom; atorvastatin; lisinopril; losartan potassium; sulfonamide derivatives; latex; and other.  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  Surgical History Patient  has a past surgical history that includes Abdominal hysterectomy (1974); Thyroid surgery (2011); Breast lumpectomy (1976); Vaginal delivery; Colonoscopy; and Polypectomy.  Family History Pateint's family history includes Cancer in her mother and sister; Hypertension in her  mother; Leukemia in her sister; Lymphoma in her sister; Ovarian cancer in her mother; Thyroid disease in her mother.  Social History Patient  reports that she has never smoked. She has never used smokeless tobacco. She reports that she drinks alcohol. She reports that she does not use drugs.    Objective: Vitals:   07/24/18 0921  BP: (!) 158/86  Pulse: 76  Temp: 97.6 F (36.4 C)  TempSrc: Oral  SpO2: 97%  Weight: 140 lb 9.6 oz (63.8 kg)  Height: 5' (1.524 m)    Body mass index is 27.46 kg/m.  Physical Exam  Constitutional: She appears well-developed and well-nourished.  Cardiovascular: Normal rate, regular rhythm and normal heart sounds.  Pulmonary/Chest: Effort normal and breath sounds normal.  Abdominal: Soft. Bowel sounds are normal. She exhibits no distension. There is tenderness (suprapubically ). There is no rebound and no guarding.  Musculoskeletal:  No CVA tenderness   Vitals reviewed.      Assessment/plan: 1. Acute cystitis without hematuria Course of macrobid. Has tolerated well in the past. Continue fluids. Will f/u on culture. Fever/chills CVA tenderness go to ER.  - POCT urinalysis dipstick; Future - Urine Culture; Future - Urine Culture - POCT urinalysis dipstick     Return if symptoms worsen or fail to improve.    Orma Flaming, MD [place]   07/24/2018

## 2018-07-26 LAB — URINE CULTURE
MICRO NUMBER:: 91126368
SPECIMEN QUALITY: ADEQUATE

## 2018-09-01 DIAGNOSIS — Z01419 Encounter for gynecological examination (general) (routine) without abnormal findings: Secondary | ICD-10-CM | POA: Diagnosis not present

## 2018-09-01 DIAGNOSIS — Z79890 Hormone replacement therapy (postmenopausal): Secondary | ICD-10-CM | POA: Diagnosis not present

## 2018-09-02 DIAGNOSIS — Z85820 Personal history of malignant melanoma of skin: Secondary | ICD-10-CM | POA: Diagnosis not present

## 2018-09-02 DIAGNOSIS — R69 Illness, unspecified: Secondary | ICD-10-CM | POA: Diagnosis not present

## 2018-09-02 DIAGNOSIS — D229 Melanocytic nevi, unspecified: Secondary | ICD-10-CM | POA: Diagnosis not present

## 2018-09-02 DIAGNOSIS — L821 Other seborrheic keratosis: Secondary | ICD-10-CM | POA: Diagnosis not present

## 2018-09-10 DIAGNOSIS — N6011 Diffuse cystic mastopathy of right breast: Secondary | ICD-10-CM | POA: Diagnosis not present

## 2018-09-10 DIAGNOSIS — N6012 Diffuse cystic mastopathy of left breast: Secondary | ICD-10-CM | POA: Diagnosis not present

## 2018-09-10 DIAGNOSIS — Z87898 Personal history of other specified conditions: Secondary | ICD-10-CM | POA: Diagnosis not present

## 2018-09-10 DIAGNOSIS — Z1231 Encounter for screening mammogram for malignant neoplasm of breast: Secondary | ICD-10-CM | POA: Diagnosis not present

## 2018-09-12 DIAGNOSIS — J301 Allergic rhinitis due to pollen: Secondary | ICD-10-CM | POA: Diagnosis not present

## 2018-09-12 DIAGNOSIS — J3089 Other allergic rhinitis: Secondary | ICD-10-CM | POA: Diagnosis not present

## 2018-09-12 DIAGNOSIS — J4520 Mild intermittent asthma, uncomplicated: Secondary | ICD-10-CM | POA: Diagnosis not present

## 2018-09-12 DIAGNOSIS — J3081 Allergic rhinitis due to animal (cat) (dog) hair and dander: Secondary | ICD-10-CM | POA: Diagnosis not present

## 2019-01-15 ENCOUNTER — Encounter: Payer: Self-pay | Admitting: Family Medicine

## 2019-01-19 ENCOUNTER — Ambulatory Visit: Payer: Medicare HMO | Admitting: Family Medicine

## 2019-01-22 ENCOUNTER — Other Ambulatory Visit: Payer: Self-pay | Admitting: Endocrinology

## 2019-03-02 ENCOUNTER — Telehealth: Payer: Self-pay

## 2019-03-02 NOTE — Telephone Encounter (Signed)
Patient has refused Prolia

## 2019-03-09 ENCOUNTER — Ambulatory Visit: Payer: Medicare HMO | Admitting: Family Medicine

## 2019-03-09 ENCOUNTER — Ambulatory Visit: Payer: Medicare HMO | Admitting: Endocrinology

## 2019-04-02 ENCOUNTER — Other Ambulatory Visit: Payer: Self-pay

## 2019-04-02 ENCOUNTER — Ambulatory Visit (INDEPENDENT_AMBULATORY_CARE_PROVIDER_SITE_OTHER): Payer: Medicare HMO | Admitting: Family Medicine

## 2019-04-02 ENCOUNTER — Encounter: Payer: Self-pay | Admitting: Family Medicine

## 2019-04-02 VITALS — BP 154/70 | HR 68 | Temp 98.0°F | Ht 60.0 in | Wt 142.2 lb

## 2019-04-02 DIAGNOSIS — Z9109 Other allergy status, other than to drugs and biological substances: Secondary | ICD-10-CM

## 2019-04-02 DIAGNOSIS — Z0000 Encounter for general adult medical examination without abnormal findings: Secondary | ICD-10-CM | POA: Diagnosis not present

## 2019-04-02 DIAGNOSIS — I10 Essential (primary) hypertension: Secondary | ICD-10-CM

## 2019-04-02 DIAGNOSIS — E785 Hyperlipidemia, unspecified: Secondary | ICD-10-CM | POA: Diagnosis not present

## 2019-04-02 DIAGNOSIS — E890 Postprocedural hypothyroidism: Secondary | ICD-10-CM

## 2019-04-02 MED ORDER — OLMESARTAN MEDOXOMIL 40 MG PO TABS
40.0000 mg | ORAL_TABLET | Freq: Every day | ORAL | 3 refills | Status: DC
Start: 2019-04-02 — End: 2020-03-02

## 2019-04-02 MED ORDER — ROSUVASTATIN CALCIUM 5 MG PO TABS: ORAL_TABLET | ORAL | 3 refills | Status: DC

## 2019-04-02 NOTE — Patient Instructions (Signed)
6 month f/u for HTN.

## 2019-04-02 NOTE — Progress Notes (Signed)
Phone: [phone]  Subjective:  Patient presents today for their Welcome to Medicare Exam  And HTN/hypothyroid/hyperlipidemia follow up.   Preventive Screening-Counseling & Management  Hypertension: Here for follow up of hypertension.  Currently on benicar 40mg  and hctz 12.5mg . Home readings range from 761-607 PXTGGYIR/48 diastolic. History of white coat HTN. Takes medication as prescribed and denies any side effects. Exercise includes walking, biking, pickle ball. Weight has been stable. Denies any chest pain, headaches, shortness of breath, vision changes, swelling in lower extremities.   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 Currently on no medication. We are going to test her today. She has been off medication since august. She has no symptoms.   Dyslipidemia: she takes crestor every other day. No issues with medication.   Advanced directives: yes  Smoking Status: Never Smoker Second Hand Smoking status: No smokers in home  Risk Factors Regular exercise: pickle ball, walking Diet: Well balanced  Fall Risk: None  Fall Risk  04/02/2019 06/27/2018  Falls in the past year? 0 No  Number falls in past yr: 0 -  Injury with Fall? 0 -   Opioid use history:  no long term opioids use  Cardiac risk factors:  advanced age (older than 10 for men, 54 for women) yes Hyperlipidemia yes No diabetes. Family History: none   Depression Screen None. PHQ2 0  Depression screen [facility] 2/9 04/02/2019 06/27/2018  Decreased Interest 0 0  Down, Depressed, Hopeless 0 0  PHQ - 2 Score 0 0   Depression screen [facility] 2/9 04/02/2019 06/27/2018  Decreased Interest 0 0  Down, Depressed, Hopeless 0 0  PHQ - 2 Score 0 0    Activities of Daily Living Independent ADLs and IADLs   Hearing Difficulties: -patient declines  Cognitive Testing Mini-cog: 4/5 No reported trouble.    Normal 3 word recall  List the Names of Other Physician/Practitioners you currently use: -Dr.  Tafeen-dermatology -Dr. Henley-gynecology -Dr. McCuen-ophthalmology   -Dr.  Fredderick Phenix  -allergist   Immunization History  Administered Date(s) Administered  . Pneumococcal Conjugate-13 02/16/2015  . Pneumococcal Polysaccharide-23 10/11/2008  . Zoster 02/22/2015   Required Immunizations needed today none  Screening tests- up to date Health Maintenance Due  Topic Date Due  . TETANUS/TDAP  03/20/1962  . MAMMOGRAM  08/28/2018    Review of Systems  Constitutional: Negative for chills, fever and malaise/fatigue.  HENT: Negative for hearing loss and sore throat.   Eyes: Negative for blurred vision and double vision.  Respiratory: Negative for cough, shortness of breath and wheezing.   Cardiovascular: Negative for chest pain, palpitations and leg swelling.  Gastrointestinal: Negative for abdominal pain, blood in stool, nausea and vomiting.  Genitourinary: Negative for dysuria and hematuria.  Musculoskeletal: Negative for falls.  Skin: Negative for rash.  Neurological: Negative for dizziness and weakness.  Psychiatric/Behavioral: Negative for memory loss and suicidal ideas. The patient is not nervous/anxious and does not have insomnia.      The following were reviewed and entered/updated in epic: Past Medical History:  Diagnosis Date  . Acute cystitis 09/26/2007   recurrent UTI's  . Allergy   . Atypical nevus 11/17/2002   Upper Center Back - Moderate  . Atypical nevus 11/17/2002   Right Lower Abdomen - Moderate  . Atypical nevus 04/23/2006   Left Upper Arm - Slight to Moderate  . Atypical nevus 04/23/2006   Left Outer Lower Leg - Slight to Moderate  . Atypical nevus 04/15/2012   Left Upper Buttock - Mild  . Atypical nevus 08/25/2014  Right Inner Knee - Moderate  . Atypical nevus 08/25/2014   Left Scapula - Severe  . COUGH DUE TO ACE INHIBITORS 06/20/2009  . 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 GOITER, MULTINODULAR 10/11/2009   Benign L thyroid nodule  .  Headache(784.0) 06/08/2008  . HEMATURIA UNSPECIFIED 10/05/2008  . HYPERGLYCEMIA 09/26/2007  . HYPERLIPIDEMIA 07/21/2007  . HYPERTENSION 11/22/2008  . HYPOTHYROIDISM 07/21/2007  . INSOMNIA 06/08/2008  . Melanoma in situ [facility]) 03/07/2011   Base Of Post Neck  . Melanoma in situ ([place]) 05/07/2011   Base Of Post Neck - Clear  . MYALGIA 10/23/2010  . OSTEOPOROSIS 07/21/2007  . WEIGHT GAIN 06/08/2008   Patient Active Problem List   Diagnosis Date Noted  . Environmental allergies 06/27/2018  . 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 Melanoma in situ ([place]) 03/07/2011  . 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 COUGH DUE TO ACE INHIBITORS 06/20/2009  . Essential hypertension 11/22/2008  . HEMATURIA UNSPECIFIED 10/05/2008  . Dyslipidemia 07/21/2007  . Osteopenia 07/21/2007   Past Surgical History:  Procedure Laterality Date  . ABDOMINAL HYSTERECTOMY  1974  . BREAST LUMPECTOMY  1976   benign  . COLONOSCOPY    . POLYPECTOMY    . THYROID SURGERY  2011   left nodule removed  . VAGINAL DELIVERY     x1    Family History  Problem Relation Age of Onset  . Cancer Mother        Ovarian Cancer  . Ovarian cancer Mother   . Hypertension Mother   . Thyroid disease Mother   . Lymphoma Sister   . Cancer Sister        Breast Cancer, Kidney Cancer  . Leukemia Sister   . Colon cancer Neg Hx   . Rectal cancer Neg Hx   . Stomach cancer Neg Hx     Medications- reviewed and updated Current Outpatient Medications  Medication Sig Dispense Refill  . albuterol (VENTOLIN HFA) 108 (90 Base) MCG/ACT inhaler Ventolin HFA 90 mcg/actuation aerosol inhaler  INL 2 PFS PO Q 4 H PRN    . calcium carbonate (OS-CAL - DOSED IN MG OF ELEMENTAL CALCIUM) 1250 MG tablet Take 1 tablet by mouth daily.      . cholecalciferol (VITAMIN D) 1000 UNITS tablet Take 1,000 Units by mouth daily.      . Coenzyme Q10 (CO Q 10) 100 MG CAPS Take 100 mg by mouth daily.    . hydrochlorothiazide (MICROZIDE) 12.5 MG capsule Take 1  capsule (12.5 mg total) by mouth daily. 90 capsule 3  . ibuprofen (ADVIL,MOTRIN) 200 MG tablet Take 200 mg by mouth every 6 (six) hours as needed.    Marland Kitchen olmesartan (BENICAR) 40 MG tablet Take 1 tablet (40 mg total) by mouth daily. 90 tablet 3  . rosuvastatin (CRESTOR) 5 MG tablet Take 5mg  by mouth every other day 45 tablet 3  . SYNTHROID 25 MCG tablet Take 1 tablet (25 mcg total) daily before breakfast by mouth. (Patient not taking: Reported on 04/02/2019) 90 tablet 2   No current facility-administered medications for this visit.     Allergies-reviewed and updated Allergies  Allergen Reactions  . Bee Venom Hives  . Atorvastatin     REACTION: dental pain, myalgias, and headache  . Lisinopril     REACTION: cough  . Losartan Potassium     REACTION: drowsiness  . Sulfonamide Derivatives     REACTION: Rash, Hives, Asthma  . Latex Itching and Rash     scars  .  Other Rash and Hives    shrimp. shrimp - Anaphylaxis (swelling & hives) Tape. Tape - scars, itching from latex tape    Social History   Socioeconomic History  . Marital status: Married    Spouse name: Not on file  . Number of children: Not on file  . Years of education: Not on file  . Highest education level: Not on file  Occupational History  . Occupation: Scientist, research (physical sciences): RETIRED  Social Needs  . Financial resource strain: Not on file  . Food insecurity:    Worry: Not on file    Inability: Not on file  . Transportation needs:    Medical: Not on file    Non-medical: Not on file  Tobacco Use  . Smoking status: Never Smoker  . Smokeless tobacco: Never Used  Substance and Sexual Activity  . Alcohol use: Yes    Alcohol/week: 0.0 standard drinks    Comment: socially-2 glasses a week  . Drug use: No  . Sexual activity: Not on file  Lifestyle  . Physical activity:    Days per week: Not on file    Minutes per session: Not on file  . Stress: Not on file  Relationships  . Social connections:    Talks on  phone: Not on file    Gets together: Not on file    Attends religious service: Not on file    Active member of club or organization: Not on file    Attends meetings of clubs or organizations: Not on file    Relationship status: Not on file  Other Topics Concern  . Not on file  Social History Narrative   Does not work outside the home    Objective: BP (!) 154/70 (BP Location: Left Arm, Patient Position: Sitting)   Pulse 68   Temp 98 F (36.7 C) (Oral)   Ht 5' (1.524 m)   Wt 142 lb 3.2 oz (64.5 kg)   LMP  (LMP Unknown)   SpO2 99%   BMI 27.77 kg/m  Gen: NAD, resting comfortably HEENT: Mucous membranes are moist. Oropharynx normal Neck: no thyromegaly CV: RRR no murmurs rubs or gallops Lungs: CTAB no crackles, wheeze, rhonchi Abdomen: soft/nontender/nondistended/normal bowel sounds. No rebound or guarding.  Ext: no edema Skin: warm, dry Neuro: grossly normal, moves all extremities, PERRLA  Assessment/Plan:   Medicare exam completed- discussed recommended screenings anddocumented any personalized health advice and referrals for preventive counseling. See AVS as well which was given to patient.   Status of chronic or acute concerns  1. Medicare annual wellness visit, subsequent   2. Essential hypertension Home readings to goal. Reading here a little above goal, but typically runs high in office. Her home cuff has been calibrated. Will continue current medication. Labs ordered and she will come back when she has fasted. Refills given. F/u in 6 months.  - CBC with Differential/Platelet; Future - Comprehensive metabolic panel; Future - Microalbumin / creatinine urine ratio; Future  3. Postsurgical hypothyroidism  - TSH; Future - T4, free; Future  4. Dyslipidemia  - Lipid panel; Future   Future Appointments  Date Time Provider [place]  04/03/2019  8:30 AM [facility]  10/05/2019  1:00 PM Orma Flaming, MD [facility]  04/06/2020 11:00 AM  Orma Flaming, MD [facility]   Return in about 6 months (around 10/03/2019) for HTN.   Lab/Order associations: Essential hypertension - Plan: CBC with Differential/Platelet, Comprehensive metabolic panel, Microalbumin / creatinine urine ratio  Medicare annual wellness visit, subsequent  Postsurgical hypothyroidism - Plan: TSH, T4, free  Dyslipidemia - Plan: Lipid panel  Environmental allergies  Meds ordered this encounter  Medications  . olmesartan (BENICAR) 40 MG tablet    Sig: Take 1 tablet (40 mg total) by mouth daily.    Dispense:  90 tablet    Refill:  3  . rosuvastatin (CRESTOR) 5 MG tablet    Sig: Take 5mg  by mouth every other day    Dispense:  45 tablet    Refill:  3   Fall Risk  04/02/2019 06/27/2018  Falls in the past year? 0 No  Number falls in past yr: 0 -  Injury with Fall? 0 -     Return precautions advised. Orma Flaming, MD

## 2019-04-03 ENCOUNTER — Other Ambulatory Visit (INDEPENDENT_AMBULATORY_CARE_PROVIDER_SITE_OTHER): Payer: Medicare HMO

## 2019-04-03 DIAGNOSIS — E785 Hyperlipidemia, unspecified: Secondary | ICD-10-CM | POA: Diagnosis not present

## 2019-04-03 DIAGNOSIS — E890 Postprocedural hypothyroidism: Secondary | ICD-10-CM | POA: Diagnosis not present

## 2019-04-03 DIAGNOSIS — I10 Essential (primary) hypertension: Secondary | ICD-10-CM | POA: Diagnosis not present

## 2019-04-03 LAB — LIPID PANEL
Cholesterol: 215 mg/dL — ABNORMAL HIGH (ref 0–200)
HDL: 55.6 mg/dL (ref >39.00)
LDL Cholesterol: 126 mg/dL — ABNORMAL HIGH (ref 0–99)
NonHDL: 159.13
Total CHOL/HDL Ratio: 4
Triglycerides: 167 mg/dL — ABNORMAL HIGH (ref 0.0–149.0)
VLDL: 33.4 mg/dL (ref 0.0–40.0)

## 2019-04-03 LAB — CBC WITH DIFFERENTIAL/PLATELET
Basophils Absolute: 0.1 10*3/uL (ref 0.0–0.1)
Basophils Relative: 1 % (ref 0.0–3.0)
Eosinophils Absolute: 0.2 10*3/uL (ref 0.0–0.7)
Eosinophils Relative: 2.2 % (ref 0.0–5.0)
HCT: 40.8 % (ref 36.0–46.0)
Hemoglobin: 14.2 g/dL (ref 12.0–15.0)
Lymphocytes Relative: 26.3 % (ref 12.0–46.0)
Lymphs Abs: 2.1 10*3/uL (ref 0.7–4.0)
MCHC: 34.8 g/dL (ref 30.0–36.0)
MCV: 89.7 fl (ref 78.0–100.0)
Monocytes Absolute: 0.6 10*3/uL (ref 0.1–1.0)
Monocytes Relative: 7.1 % (ref 3.0–12.0)
Neutro Abs: 5 10*3/uL (ref 1.4–7.7)
Neutrophils Relative %: 63.4 % (ref 43.0–77.0)
Platelets: 290 10*3/uL (ref 150.0–400.0)
RBC: 4.55 Mil/uL (ref 3.87–5.11)
RDW: 12.9 % (ref 11.5–15.5)
WBC: 7.8 10*3/uL (ref 4.0–10.5)

## 2019-04-03 LAB — COMPREHENSIVE METABOLIC PANEL
ALT: 12 U/L (ref 0–35)
AST: 11 U/L (ref 0–37)
Albumin: 4.4 g/dL (ref 3.5–5.2)
Alkaline Phosphatase: 73 U/L (ref 39–117)
BUN: 22 mg/dL (ref 6–23)
CO2: 27 mEq/L (ref 19–32)
Calcium: 9.7 mg/dL (ref 8.4–10.5)
Chloride: 103 mEq/L (ref 96–112)
Creatinine, Ser: 0.87 mg/dL (ref 0.40–1.20)
GFR: 63.3 mL/min (ref >60.00)
Glucose, Bld: 97 mg/dL (ref 70–99)
Potassium: 4.2 mEq/L (ref 3.5–5.1)
Sodium: 140 mEq/L (ref 135–145)
Total Bilirubin: 0.5 mg/dL (ref 0.2–1.2)
Total Protein: 6.8 g/dL (ref 6.0–8.3)

## 2019-04-03 LAB — MICROALBUMIN / CREATININE URINE RATIO
Creatinine,U: 61.4 mg/dL
Microalb Creat Ratio: 1.1 mg/g (ref 0.0–30.0)
Microalb, Ur: <0.7 mg/dL (ref 0.0–1.9)

## 2019-04-03 LAB — TSH: TSH: 2.45 u[IU]/mL (ref 0.35–4.50)

## 2019-04-03 LAB — T4, FREE: Free T4: 0.88 ng/dL (ref 0.60–1.60)

## 2019-04-05 ENCOUNTER — Encounter: Payer: Self-pay | Admitting: Family Medicine

## 2019-04-16 DIAGNOSIS — R69 Illness, unspecified: Secondary | ICD-10-CM | POA: Diagnosis not present

## 2019-04-29 DIAGNOSIS — H5203 Hypermetropia, bilateral: Secondary | ICD-10-CM | POA: Diagnosis not present

## 2019-04-29 DIAGNOSIS — D3131 Benign neoplasm of right choroid: Secondary | ICD-10-CM | POA: Diagnosis not present

## 2019-04-29 DIAGNOSIS — H2513 Age-related nuclear cataract, bilateral: Secondary | ICD-10-CM | POA: Diagnosis not present

## 2019-04-29 DIAGNOSIS — Z0100 Encounter for examination of eyes and vision without abnormal findings: Secondary | ICD-10-CM | POA: Diagnosis not present

## 2019-08-17 ENCOUNTER — Other Ambulatory Visit: Payer: Self-pay | Admitting: Family Medicine

## 2019-08-31 DIAGNOSIS — H2513 Age-related nuclear cataract, bilateral: Secondary | ICD-10-CM | POA: Diagnosis not present

## 2019-09-01 DIAGNOSIS — J3081 Allergic rhinitis due to animal (cat) (dog) hair and dander: Secondary | ICD-10-CM | POA: Diagnosis not present

## 2019-09-01 DIAGNOSIS — J301 Allergic rhinitis due to pollen: Secondary | ICD-10-CM | POA: Diagnosis not present

## 2019-09-01 DIAGNOSIS — J4520 Mild intermittent asthma, uncomplicated: Secondary | ICD-10-CM | POA: Diagnosis not present

## 2019-09-01 DIAGNOSIS — J3089 Other allergic rhinitis: Secondary | ICD-10-CM | POA: Diagnosis not present

## 2019-09-10 DIAGNOSIS — H2511 Age-related nuclear cataract, right eye: Secondary | ICD-10-CM | POA: Diagnosis not present

## 2019-09-10 DIAGNOSIS — H25811 Combined forms of age-related cataract, right eye: Secondary | ICD-10-CM | POA: Diagnosis not present

## 2019-09-16 DIAGNOSIS — Z87898 Personal history of other specified conditions: Secondary | ICD-10-CM | POA: Diagnosis not present

## 2019-09-16 DIAGNOSIS — Z1231 Encounter for screening mammogram for malignant neoplasm of breast: Secondary | ICD-10-CM | POA: Diagnosis not present

## 2019-09-16 DIAGNOSIS — N6012 Diffuse cystic mastopathy of left breast: Secondary | ICD-10-CM | POA: Diagnosis not present

## 2019-09-16 DIAGNOSIS — N6011 Diffuse cystic mastopathy of right breast: Secondary | ICD-10-CM | POA: Diagnosis not present

## 2019-09-22 DIAGNOSIS — L821 Other seborrheic keratosis: Secondary | ICD-10-CM | POA: Diagnosis not present

## 2019-09-22 DIAGNOSIS — L719 Rosacea, unspecified: Secondary | ICD-10-CM | POA: Diagnosis not present

## 2019-09-22 DIAGNOSIS — D229 Melanocytic nevi, unspecified: Secondary | ICD-10-CM | POA: Diagnosis not present

## 2019-09-25 ENCOUNTER — Other Ambulatory Visit: Payer: Self-pay

## 2019-09-28 ENCOUNTER — Encounter: Payer: Self-pay | Admitting: Family Medicine

## 2019-09-28 ENCOUNTER — Ambulatory Visit (INDEPENDENT_AMBULATORY_CARE_PROVIDER_SITE_OTHER): Payer: Medicare HMO | Admitting: Family Medicine

## 2019-09-28 VITALS — BP 142/78 | HR 87 | Temp 98.5°F | Ht 60.0 in | Wt 142.6 lb

## 2019-09-28 DIAGNOSIS — I10 Essential (primary) hypertension: Secondary | ICD-10-CM | POA: Diagnosis not present

## 2019-09-28 DIAGNOSIS — E890 Postprocedural hypothyroidism: Secondary | ICD-10-CM | POA: Diagnosis not present

## 2019-09-28 LAB — CBC WITH DIFFERENTIAL/PLATELET
Basophils Absolute: 0.1 10*3/uL (ref 0.0–0.1)
Basophils Relative: 1.1 % (ref 0.0–3.0)
Eosinophils Absolute: 0.1 10*3/uL (ref 0.0–0.7)
Eosinophils Relative: 1 % (ref 0.0–5.0)
HCT: 40.1 % (ref 36.0–46.0)
Hemoglobin: 13.4 g/dL (ref 12.0–15.0)
Lymphocytes Relative: 20.2 % (ref 12.0–46.0)
Lymphs Abs: 1.9 10*3/uL (ref 0.7–4.0)
MCHC: 33.4 g/dL (ref 30.0–36.0)
MCV: 91.3 fl (ref 78.0–100.0)
Monocytes Absolute: 0.6 10*3/uL (ref 0.1–1.0)
Monocytes Relative: 6.7 % (ref 3.0–12.0)
Neutro Abs: 6.7 10*3/uL (ref 1.4–7.7)
Neutrophils Relative %: 71 % (ref 43.0–77.0)
Platelets: 300 10*3/uL (ref 150.0–400.0)
RBC: 4.39 Mil/uL (ref 3.87–5.11)
RDW: 13.3 % (ref 11.5–15.5)
WBC: 9.5 10*3/uL (ref 4.0–10.5)

## 2019-09-28 LAB — COMPREHENSIVE METABOLIC PANEL
ALT: 14 U/L (ref 0–35)
AST: 14 U/L (ref 0–37)
Albumin: 4.2 g/dL (ref 3.5–5.2)
Alkaline Phosphatase: 73 U/L (ref 39–117)
BUN: 21 mg/dL (ref 6–23)
CO2: 25 mEq/L (ref 19–32)
Calcium: 9.7 mg/dL (ref 8.4–10.5)
Chloride: 99 mEq/L (ref 96–112)
Creatinine, Ser: 0.79 mg/dL (ref 0.40–1.20)
GFR: 70.66 mL/min (ref >60.00)
Glucose, Bld: 84 mg/dL (ref 70–99)
Potassium: 4.6 mEq/L (ref 3.5–5.1)
Sodium: 138 mEq/L (ref 135–145)
Total Bilirubin: 0.5 mg/dL (ref 0.2–1.2)
Total Protein: 6.9 g/dL (ref 6.0–8.3)

## 2019-09-28 LAB — TSH: TSH: 2.16 u[IU]/mL (ref 0.35–4.50)

## 2019-09-28 NOTE — Progress Notes (Signed)
Patient: Donna Andersen MRN: [id number] DOB: [birth year] PCP: Orma Flaming, MD     Subjective:  Chief Complaint  Patient presents with  . Hypertension    HPI: The patient is a 78 y.o. female who presents today for follow up on hypertension.   Hypertension: Here for follow up of hypertension.  Currently on hctz 12.5mg  and benicar 40mg . Home readings range from [id number] diastolic. Takes medication as prescribed and denies any side effects. Exercise includes walking and pickle ball . Weight has been stable. Denies any chest pain, headaches, shortness of breath, vision changes, swelling in lower extremities.   Rechecking thyroid today.   Review of Systems  Constitutional: Negative for fatigue.  Respiratory: Negative for shortness of breath.   Cardiovascular: Negative for chest pain, palpitations and leg swelling.  Gastrointestinal: Negative for abdominal pain, diarrhea, nausea and vomiting.  Endocrine: Negative for polydipsia and polyuria.  Neurological: Negative for dizziness and headaches.    Allergies Patient is allergic to bee venom; atorvastatin; lisinopril; losartan potassium; sulfonamide derivatives; latex; and other.  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  Surgical History Patient  has a past surgical history that includes Abdominal hysterectomy (1974); Thyroid surgery (2011); Breast lumpectomy (1976); Vaginal delivery; Colonoscopy; and Polypectomy.  Family History Pateint's family history includes Cancer in her mother and sister; Hypertension in her mother; Leukemia in her sister; Lymphoma in her sister; Ovarian cancer in her mother; Thyroid disease in her mother.  Social History Patient  reports that she has never smoked. She has never used smokeless tobacco. She reports current alcohol use. She reports that she does not use drugs.    Objective: Vitals:   09/28/19 1133  BP: (!) 142/78  Pulse: 87  Temp: 98.5 F (36.9 C)  TempSrc: Skin  SpO2: 98%  Weight: 142 lb 9.6 oz (64.7 kg)  Height: 5' (1.524 m)    Body mass index is 27.85 kg/m.  Physical Exam Vitals signs reviewed.  Constitutional:      Appearance: Normal appearance. She is well-developed.  HENT:     Head: Normocephalic and atraumatic.     Right Ear: Tympanic membrane, ear canal and external ear normal.     Left Ear: Tympanic membrane, ear canal and external ear normal.     Nose: Nose normal.     Mouth/Throat:     Mouth: Mucous membranes are moist.  Eyes:     Extraocular Movements: Extraocular movements intact.     Conjunctiva/sclera: Conjunctivae normal.     Pupils: Pupils are equal, round, and reactive to light.  Neck:     Musculoskeletal: Normal range of motion and neck supple.     Thyroid: No thyromegaly.  Cardiovascular:     Rate and Rhythm: Normal rate and regular rhythm.     Heart sounds: Normal heart sounds. No murmur.  Pulmonary:     Effort: Pulmonary effort is normal.     Breath sounds: Normal breath sounds.  Abdominal:     General: Abdomen is flat. Bowel sounds are normal. There is no distension.  Palpations: Abdomen is soft.     Tenderness: There is no abdominal tenderness.  Lymphadenopathy:     Cervical: No cervical adenopathy.   Skin:    General: Skin is warm and dry.     Findings: No rash.  Neurological:     General: No focal deficit present.     Mental Status: She is alert and oriented to person, place, and time.     Cranial Nerves: No cranial nerve deficit.     Coordination: Coordination normal.     Deep Tendon Reflexes: Reflexes normal.  Psychiatric:        Mood and Affect: Mood normal.        Behavior: Behavior normal.    Depression screen [facility] 2/9 09/28/2019 04/02/2019 06/27/2018  Decreased Interest 0 0 0  Down, Depressed, Hopeless 0 0 0  PHQ - 2 Score 0 0 0         Assessment/plan: 1. Essential hypertension Started her on hctz at last visit. BP is much better and near to goal. Home readings to goal. continue current medication. Routine labs today. F/u in 6 months.  - CBC with Differential/Platelet - Comprehensive metabolic panel  2. Postsurgical hypothyroidism  - TSH   Return in about 6 months (around 03/27/2020).  This visit occurred during the SARS-CoV-2 public health emergency.  Safety protocols were in place, including screening questions prior to the visit, additional usage of staff PPE, and extensive cleaning of exam room while observing appropriate contact time as indicated for disinfecting solutions.    Orma Flaming, MD [place] [facility]   09/28/2019

## 2019-10-05 ENCOUNTER — Ambulatory Visit: Payer: Medicare HMO | Admitting: Family Medicine

## 2019-10-08 DIAGNOSIS — H25812 Combined forms of age-related cataract, left eye: Secondary | ICD-10-CM | POA: Diagnosis not present

## 2019-10-08 DIAGNOSIS — H2512 Age-related nuclear cataract, left eye: Secondary | ICD-10-CM | POA: Diagnosis not present

## 2019-11-12 DIAGNOSIS — Z0100 Encounter for examination of eyes and vision without abnormal findings: Secondary | ICD-10-CM | POA: Diagnosis not present

## 2019-12-01 ENCOUNTER — Ambulatory Visit: Payer: Medicare HMO

## 2019-12-10 ENCOUNTER — Ambulatory Visit: Payer: Medicare HMO | Attending: Internal Medicine

## 2019-12-10 DIAGNOSIS — Z23 Encounter for immunization: Secondary | ICD-10-CM | POA: Insufficient documentation

## 2019-12-10 NOTE — Progress Notes (Signed)
   Covid-19 Vaccination Clinic  Name:  Donna Andersen    MRN: [id number] DOB: 04/13/1941  12/10/2019  [name] was observed post Covid-19 immunization for 15 minutes without incidence. She was provided with Vaccine Information Sheet and instruction to access the V-Safe system.   [name] was instructed to call 911 with any severe reactions post vaccine: Marland Kitchen Difficulty breathing  . Swelling of your face and throat  . A fast heartbeat  . A bad rash all over your body  . Dizziness and weakness    Immunizations Administered    Name Date Dose VIS Date Route   Pfizer COVID-19 Vaccine 12/10/2019  1:08 PM 0.3 mL 10/16/2019 Intramuscular   Manufacturer: [place]   Lot: [id number]   [place]: [id number]

## 2019-12-18 ENCOUNTER — Ambulatory Visit: Payer: Medicare HMO

## 2020-01-04 ENCOUNTER — Ambulatory Visit: Payer: Medicare HMO | Attending: Internal Medicine

## 2020-01-04 DIAGNOSIS — Z23 Encounter for immunization: Secondary | ICD-10-CM | POA: Insufficient documentation

## 2020-01-04 NOTE — Progress Notes (Signed)
   Covid-19 Vaccination Clinic  Name:  Donna Andersen    MRN: [id number] DOB: 05/20/1941  01/04/2020  Donna Andersen was observed post Covid-19 immunization for 15 minutes without incidence. She was provided with Vaccine Information Sheet and instruction to access the V-Safe system.   Donna Andersen was instructed to call 911 with any severe reactions post vaccine: Marland Kitchen Difficulty breathing  . Swelling of your face and throat  . A fast heartbeat  . A bad rash all over your body  . Dizziness and weakness    Immunizations Administered    Name Date Dose VIS Date Route   Pfizer COVID-19 Vaccine 01/04/2020  3:16 PM 0.3 mL 10/16/2019 Intramuscular   Manufacturer: [place]   Lot: [id number]   [place]: [id number]

## 2020-03-02 ENCOUNTER — Other Ambulatory Visit: Payer: Self-pay | Admitting: Family Medicine

## 2020-03-02 DIAGNOSIS — Z01419 Encounter for gynecological examination (general) (routine) without abnormal findings: Secondary | ICD-10-CM | POA: Diagnosis not present

## 2020-03-02 DIAGNOSIS — Z6829 Body mass index (BMI) 29.0-29.9, adult: Secondary | ICD-10-CM | POA: Diagnosis not present

## 2020-03-17 DIAGNOSIS — J3081 Allergic rhinitis due to animal (cat) (dog) hair and dander: Secondary | ICD-10-CM | POA: Diagnosis not present

## 2020-03-17 DIAGNOSIS — J3089 Other allergic rhinitis: Secondary | ICD-10-CM | POA: Diagnosis not present

## 2020-03-17 DIAGNOSIS — J301 Allergic rhinitis due to pollen: Secondary | ICD-10-CM | POA: Diagnosis not present

## 2020-03-17 DIAGNOSIS — J4520 Mild intermittent asthma, uncomplicated: Secondary | ICD-10-CM | POA: Diagnosis not present

## 2020-03-23 ENCOUNTER — Other Ambulatory Visit: Payer: Self-pay | Admitting: Family Medicine

## 2020-04-06 ENCOUNTER — Encounter: Payer: Medicare HMO | Admitting: Family Medicine

## 2020-04-08 ENCOUNTER — Encounter: Payer: Self-pay | Admitting: Family Medicine

## 2020-04-08 ENCOUNTER — Other Ambulatory Visit: Payer: Self-pay

## 2020-04-08 ENCOUNTER — Ambulatory Visit (INDEPENDENT_AMBULATORY_CARE_PROVIDER_SITE_OTHER): Payer: Medicare HMO | Admitting: Family Medicine

## 2020-04-08 VITALS — BP 124/76 | HR 84 | Temp 97.7°F | Ht 60.0 in | Wt 142.4 lb

## 2020-04-08 DIAGNOSIS — E785 Hyperlipidemia, unspecified: Secondary | ICD-10-CM | POA: Diagnosis not present

## 2020-04-08 DIAGNOSIS — E890 Postprocedural hypothyroidism: Secondary | ICD-10-CM | POA: Diagnosis not present

## 2020-04-08 DIAGNOSIS — I10 Essential (primary) hypertension: Secondary | ICD-10-CM

## 2020-04-08 DIAGNOSIS — E559 Vitamin D deficiency, unspecified: Secondary | ICD-10-CM | POA: Diagnosis not present

## 2020-04-08 LAB — COMPREHENSIVE METABOLIC PANEL
ALT: 14 U/L (ref 0–35)
AST: 13 U/L (ref 0–37)
Albumin: 4.7 g/dL (ref 3.5–5.2)
Alkaline Phosphatase: 81 U/L (ref 39–117)
BUN: 17 mg/dL (ref 6–23)
CO2: 28 mEq/L (ref 19–32)
Calcium: 9.6 mg/dL (ref 8.4–10.5)
Chloride: 103 mEq/L (ref 96–112)
Creatinine, Ser: 0.88 mg/dL (ref 0.40–1.20)
GFR: 62.3 mL/min (ref >60.00)
Glucose, Bld: 95 mg/dL (ref 70–99)
Potassium: 4.6 mEq/L (ref 3.5–5.1)
Sodium: 139 mEq/L (ref 135–145)
Total Bilirubin: 0.6 mg/dL (ref 0.2–1.2)
Total Protein: 6.9 g/dL (ref 6.0–8.3)

## 2020-04-08 LAB — CBC WITH DIFFERENTIAL/PLATELET
Basophils Absolute: 0.1 10*3/uL (ref 0.0–0.1)
Basophils Relative: 1.1 % (ref 0.0–3.0)
Eosinophils Absolute: 0.1 10*3/uL (ref 0.0–0.7)
Eosinophils Relative: 1.7 % (ref 0.0–5.0)
HCT: 39.9 % (ref 36.0–46.0)
Hemoglobin: 13.3 g/dL (ref 12.0–15.0)
Lymphocytes Relative: 25.1 % (ref 12.0–46.0)
Lymphs Abs: 1.7 10*3/uL (ref 0.7–4.0)
MCHC: 33.5 g/dL (ref 30.0–36.0)
MCV: 91 fl (ref 78.0–100.0)
Monocytes Absolute: 0.5 10*3/uL (ref 0.1–1.0)
Monocytes Relative: 7.8 % (ref 3.0–12.0)
Neutro Abs: 4.2 10*3/uL (ref 1.4–7.7)
Neutrophils Relative %: 64.3 % (ref 43.0–77.0)
Platelets: 261 10*3/uL (ref 150.0–400.0)
RBC: 4.38 Mil/uL (ref 3.87–5.11)
RDW: 13.5 % (ref 11.5–15.5)
WBC: 6.6 10*3/uL (ref 4.0–10.5)

## 2020-04-08 LAB — LIPID PANEL
Cholesterol: 216 mg/dL — ABNORMAL HIGH (ref 0–200)
HDL: 59.7 mg/dL (ref >39.00)
LDL Cholesterol: 128 mg/dL — ABNORMAL HIGH (ref 0–99)
NonHDL: 156.05
Total CHOL/HDL Ratio: 4
Triglycerides: 138 mg/dL (ref 0.0–149.0)
VLDL: 27.6 mg/dL (ref 0.0–40.0)

## 2020-04-08 LAB — VITAMIN D 25 HYDROXY (VIT D DEFICIENCY, FRACTURES): VITD: 38.57 ng/mL (ref 30.00–100.00)

## 2020-04-08 LAB — TSH: TSH: 1.45 u[IU]/mL (ref 0.35–4.50)

## 2020-04-08 NOTE — Progress Notes (Signed)
Patient: Donna Andersen MRN: [id number] DOB: [birth year] PCP: Orma Flaming, MD     Subjective:  Chief Complaint  Patient presents with  . Hypertension  . Hyperlipidemia  . Hypothyroidism    HPI: The patient is a 79 y.o. female who presents today for HTN, hyperlipidemia and hypothyroidism  f/u and labs.  Hypertension  Here for follow up of hypertension.  Currently on benicar 40mg , and hctz 12.5mg  daily. Home readings range from 202-542 HCWCBJSE/83-15 diastolic. Takes medication as prescribed and denies any side effects. Exercise includes pickle ball, walking Weight has been stable. Denies any chest pain, headaches, shortness of breath, vision changes, swelling in lower extremities.   Hyperlipidemia She is on crestor every other day and is tolerating well. She is fasting today.  The 10-year ASCVD risk score [name], et al., 2013) is: 24.3%  Postsurgical thyroid Will check TSH today. On no medication.   Review of Systems  Constitutional: Negative for chills, fatigue and fever.  HENT: Negative for dental problem, ear pain, hearing loss and trouble swallowing.   Eyes: Negative for visual disturbance.  Respiratory: Negative for cough, chest tightness, shortness of breath and wheezing.   Cardiovascular: Negative for chest pain, palpitations and leg swelling.  Gastrointestinal: Negative for abdominal pain, blood in stool, diarrhea, nausea and vomiting.  Endocrine: Negative for cold intolerance, polydipsia, polyphagia and polyuria.  Genitourinary: Negative for dysuria, frequency, hematuria, pelvic pain and urgency.  Musculoskeletal: Negative for arthralgias.  Skin: Negative for rash.  Neurological: Negative for dizziness, light-headedness and headaches.  Psychiatric/Behavioral: Negative for dysphoric mood and sleep disturbance. The patient is not nervous/anxious.     Allergies Patient is allergic to bee venom; atorvastatin; lisinopril; losartan potassium; sulfonamide  derivatives; latex; and other.  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  Surgical History Patient  has a past surgical history that includes Abdominal hysterectomy (1974); Thyroid surgery (2011); Breast lumpectomy (1976); Vaginal delivery; Colonoscopy; and Polypectomy.  Family History Pateint's family history includes Cancer in her mother and sister; Hypertension in her mother; Leukemia in her sister; Lymphoma in her sister; Ovarian cancer in her mother; Thyroid disease in her mother.  Social History Patient  reports that she has never smoked. She has never used smokeless tobacco. She reports current alcohol use. She reports that she does not use drugs.    Objective: Vitals:   04/08/20 1047  BP: 124/76  Pulse: 84  Temp: 97.7 F (36.5 C)  TempSrc: Temporal  SpO2: 96%  Weight: 142 lb 6.4 oz (64.6 kg)  Height: 5' (1.524 m)    Body mass index is 27.81 kg/m.  Physical Exam Vitals reviewed.  Constitutional:      Appearance: Normal appearance. She is well-developed.  HENT:     Head: Normocephalic and atraumatic.     Right Ear: Tympanic membrane, ear canal and external ear normal.     Left Ear: Tympanic membrane, ear canal and external ear normal.  Eyes:     Extraocular  Movements: Extraocular movements intact.     Conjunctiva/sclera: Conjunctivae normal.  Pupils: Pupils are equal, round, and reactive to light.  Neck:     Thyroid: No thyromegaly.     Vascular: No carotid bruit.  Cardiovascular:     Rate and Rhythm: Normal rate and regular rhythm.     Pulses: Normal pulses.     Heart sounds: Murmur (systolic. right 2nd intercostal space ) present.  Pulmonary:     Effort: Pulmonary effort is normal.     Breath sounds: Normal breath sounds.  Abdominal:     General: Abdomen is flat. Bowel sounds are normal. There is no distension.     Palpations: Abdomen is soft.     Tenderness: There is no abdominal tenderness.  Musculoskeletal:     Cervical back: Normal range of motion and neck supple.  Lymphadenopathy:     Cervical: No cervical adenopathy.  Skin:    General: Skin is warm and dry.     Capillary Refill: Capillary refill takes less than 2 seconds.     Findings: No rash.  Neurological:     General: No focal deficit present.     Mental Status: She is alert and oriented to person, place, and time.     Cranial Nerves: No cranial nerve deficit.     Coordination: Coordination normal.     Deep Tendon Reflexes: Reflexes normal.  Psychiatric:        Mood and Affect: Mood normal.        Behavior: Behavior normal.      Office Visit from 04/08/2020 in [facility]  PHQ-2 Total Score  0         Assessment/plan: 1. Essential hypertension Blood pressure is to goal. Continue current anti-hypertensive medications per hpi. Refills not given and routine lab work will be done today. Recommended routine exercise and healthy diet including DASH diet and mediterranean diet. Encouraged weight loss. F/u in 6 months.   - CBC with Differential/Platelet - Comprehensive metabolic panel  2. Dyslipidemia Tolerating very well doing every other day. Fasting today.  - Lipid panel  3. Postsurgical hypothyroidism Has been off medication x 1  year. TSH has remained stable.  - TSH  4. Vitamin D deficiency  - VITAMIN D 25 Hydroxy (Vit-D Deficiency, Fractures)  5. Heart murmur  New finding. Very quiet. She is asymptomatic. Discussed we will watch, but if becomes louder or she has symptoms will get echo. Will f/u on thi sin 6 months.    This visit occurred during the SARS-CoV-2 public health emergency.  Safety protocols were in place, including screening questions prior to the visit, additional usage of staff PPE, and extensive cleaning of exam room while observing appropriate contact time as indicated for disinfecting solutions.     Return in about 6 months (around 10/08/2020) for routine .    Orma Flaming, MD [place]   04/08/2020

## 2020-04-08 NOTE — Patient Instructions (Signed)
You look great! So proud of your blood pressure! See you in 6 months!!!  :)

## 2020-04-13 ENCOUNTER — Telehealth: Payer: Self-pay | Admitting: Gastroenterology

## 2020-04-13 NOTE — Telephone Encounter (Signed)
I discussed with the patient that she is not a candidate for Cologuard due to her hx of adenomatous polyps.  She wants to think about a repeat colonoscopy and will call back if she decides to schedule.

## 2020-04-19 DIAGNOSIS — R69 Illness, unspecified: Secondary | ICD-10-CM | POA: Diagnosis not present

## 2020-05-02 DIAGNOSIS — Z961 Presence of intraocular lens: Secondary | ICD-10-CM | POA: Diagnosis not present

## 2020-05-19 DIAGNOSIS — R69 Illness, unspecified: Secondary | ICD-10-CM | POA: Diagnosis not present

## 2020-08-04 ENCOUNTER — Other Ambulatory Visit: Payer: Self-pay | Admitting: Family Medicine

## 2020-09-21 DIAGNOSIS — N6011 Diffuse cystic mastopathy of right breast: Secondary | ICD-10-CM | POA: Diagnosis not present

## 2020-09-21 DIAGNOSIS — Z09 Encounter for follow-up examination after completed treatment for conditions other than malignant neoplasm: Secondary | ICD-10-CM | POA: Diagnosis not present

## 2020-09-21 DIAGNOSIS — Z87898 Personal history of other specified conditions: Secondary | ICD-10-CM | POA: Diagnosis not present

## 2020-09-21 DIAGNOSIS — Z1231 Encounter for screening mammogram for malignant neoplasm of breast: Secondary | ICD-10-CM | POA: Diagnosis not present

## 2020-09-21 DIAGNOSIS — N6012 Diffuse cystic mastopathy of left breast: Secondary | ICD-10-CM | POA: Diagnosis not present

## 2020-09-21 LAB — HM MAMMOGRAPHY

## 2020-09-23 DIAGNOSIS — J3081 Allergic rhinitis due to animal (cat) (dog) hair and dander: Secondary | ICD-10-CM | POA: Diagnosis not present

## 2020-09-23 DIAGNOSIS — J301 Allergic rhinitis due to pollen: Secondary | ICD-10-CM | POA: Diagnosis not present

## 2020-09-23 DIAGNOSIS — T63451D Toxic effect of venom of hornets, accidental (unintentional), subsequent encounter: Secondary | ICD-10-CM | POA: Diagnosis not present

## 2020-09-23 DIAGNOSIS — J3089 Other allergic rhinitis: Secondary | ICD-10-CM | POA: Diagnosis not present

## 2020-10-06 ENCOUNTER — Ambulatory Visit: Payer: Medicare HMO

## 2020-10-10 ENCOUNTER — Encounter: Payer: Self-pay | Admitting: Family Medicine

## 2020-10-10 ENCOUNTER — Other Ambulatory Visit: Payer: Self-pay

## 2020-10-10 ENCOUNTER — Ambulatory Visit (INDEPENDENT_AMBULATORY_CARE_PROVIDER_SITE_OTHER): Payer: Medicare HMO | Admitting: Family Medicine

## 2020-10-10 VITALS — BP 152/80 | HR 72 | Temp 98.7°F | Ht 60.0 in | Wt 143.2 lb

## 2020-10-10 DIAGNOSIS — M542 Cervicalgia: Secondary | ICD-10-CM

## 2020-10-10 DIAGNOSIS — I10 Essential (primary) hypertension: Secondary | ICD-10-CM

## 2020-10-10 DIAGNOSIS — D126 Benign neoplasm of colon, unspecified: Secondary | ICD-10-CM

## 2020-10-10 DIAGNOSIS — Z1159 Encounter for screening for other viral diseases: Secondary | ICD-10-CM

## 2020-10-10 LAB — CBC WITH DIFFERENTIAL/PLATELET
Absolute Monocytes: 694 cells/uL (ref 200–950)
Basophils Absolute: 86 cells/uL (ref 0–200)
HCT: 43.5 % (ref 35.0–45.0)
MCH: 30.1 pg (ref 27.0–33.0)
MCV: 90.4 fL (ref 80.0–100.0)
MPV: 10.6 fL (ref 7.5–12.5)
RBC: 4.81 10*6/uL (ref 3.80–5.10)

## 2020-10-10 NOTE — Patient Instructions (Addendum)
-  routine labs today  -watch your blood pressure symptoms. If they continue we will stop the hctz and start one called amlodipine, just email me.  -call your GI doc since you are due for colonoscopy.  -referral to PT for your neck. '  -fast for next appointment in 6 months.   Love seeing you! Merry christmas! Dr. Rogers Blocker

## 2020-10-10 NOTE — Progress Notes (Signed)
Patient: Donna Andersen MRN: [id number] DOB: [birth year] PCP: Orma Flaming, MD     Subjective:  Chief Complaint  Patient presents with  . Follow-up    6 month f/u HTN, Vit D, Dyslipidema, Hypothtyroidism.  no concerns.  declines flu shot today.  not fasting today    HPI: The patient is a 79 y.o. female who presents today for follow up of chornic health conditions including HTN, HM review.    Hypertension  Here for follow up of hypertension.  Currently on benicar 40mg , and hctz 12.5mg  daily. Home readings range from 585-277 OEUMPNTI/14-43 diastolic. Takes medication as prescribed and denies any side effects. She has been taking advil and thinks this has caused her bp to be elevated. Exercise includes pickle ball, walking Weight has been stable. Denies any chest pain, headaches, shortness of breath, vision changes, swelling in lower extremities.   Vitamin D deficiency.  We checked 6 months ago and wnl.   Hyperlipidemia We checked at her last visit. Near goal. Due in 6 months.   Colonoscopy  She was due for cscope this year. Had tubular adenoma from what it sounds like. Sees [name], discuss today.   Neck pain Has had some pain on her right side. Would like to see her PT>   Has had her covid booster, declines her flu shot.   Review of Systems  Constitutional: Negative for chills, fatigue and fever.  HENT: Negative for dental problem, ear pain, hearing loss and trouble swallowing.   Eyes: Negative for visual disturbance.  Respiratory: Negative for cough, chest tightness and shortness of breath.   Cardiovascular: Negative for chest pain, palpitations and leg swelling.  Gastrointestinal: Negative for abdominal pain, blood in stool, diarrhea and nausea.  Endocrine: Negative for cold intolerance, polydipsia, polyphagia and polyuria.  Genitourinary: Negative for dysuria and hematuria.  Musculoskeletal: Negative for arthralgias.  Skin: Negative for rash.  Neurological: Negative  for dizziness and headaches.  Psychiatric/Behavioral: Negative for dysphoric mood and sleep disturbance. The patient is not nervous/anxious.     Allergies Patient is allergic to bee venom, atorvastatin, lisinopril, losartan potassium, sulfonamide derivatives, latex, and other.  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  Surgical History Patient  has a past surgical history that includes Abdominal hysterectomy (1974); Thyroid surgery (2011); Breast lumpectomy (1976); Vaginal delivery; Colonoscopy; and Polypectomy.  Family History Pateint's family history includes Cancer in her mother and sister; Hypertension in her mother; Leukemia in her sister; Lymphoma in her sister; Ovarian cancer in her mother; Thyroid disease in her mother.  Social History Patient  reports that she has never smoked. She has never used smokeless tobacco. She reports current alcohol use. She reports that she does not use drugs.    Objective: Vitals:   10/10/20 1058  BP: (!) 152/80  Pulse: 72  Temp: 98.7 F (37.1 C)  TempSrc: Temporal  SpO2: 99%  Weight: 143 lb 3.2 oz (65 kg)  Height: 5' (1.524 m)    Body mass index is 27.97 kg/m.  Physical Exam Vitals reviewed.  Constitutional:  Appearance: Normal  appearance. She is obese.  HENT:     Head: Normocephalic and atraumatic.     Right Ear: Tympanic membrane, ear canal and external ear normal.     Left Ear: Tympanic membrane, ear canal and external ear normal.  Neck:     Comments: ttp along right trap  Cardiovascular:     Rate and Rhythm: Normal rate and regular rhythm.     Heart sounds: Normal heart sounds.  Pulmonary:     Effort: Pulmonary effort is normal.     Breath sounds: Normal breath sounds.  Abdominal:     General: Bowel sounds are normal.     Palpations: Abdomen is soft.  Musculoskeletal:     Cervical back: Normal range of motion and neck supple.  Skin:    Capillary Refill: Capillary refill takes less than 2 seconds.  Neurological:     General: No focal deficit present.     Mental Status: She is alert and oriented to person, place, and time.  Psychiatric:        Mood and Affect: Mood normal.        Behavior: Behavior normal.        Assessment/plan: 1. Essential hypertension Above goal today, but she has white coat HTN. All home readings to goal. She has had some possible side effects with hctz. She is going to keep a log and see if truly correlated. If so, we will change to norvasc. F/u in  6 months for routine appointment or sooner if we change medication.  - CBC with Differential/Platelet; Future - COMPLETE METABOLIC PANEL WITH GFR; Future  2. Encounter for hepatitis C screening test for low risk patient  - Hepatitis C antibody; Future  3. Tubular adenoma of colon She will call her gi doc.   4. Neck pain  - Ambulatory referral to Physical Therapy    This visit occurred during the SARS-CoV-2 public health emergency.  Safety protocols were in place, including screening questions prior to the visit, additional usage of staff PPE, and extensive cleaning of exam room while observing appropriate contact time as indicated for disinfecting solutions.     Return in about 6 months (around 04/10/2021) for  htn/chol/vit d. fasting! Orma Flaming, MD [place]  10/10/2020

## 2020-10-11 LAB — COMPLETE METABOLIC PANEL WITH GFR
AG Ratio: 1.8 (calc) (ref 1.0–2.5)
ALT: 16 U/L (ref 6–29)
AST: 17 U/L (ref 10–35)
Albumin: 4.7 g/dL (ref 3.6–5.1)
Alkaline phosphatase (APISO): 81 U/L (ref 37–153)
BUN: 19 mg/dL (ref 7–25)
CO2: 23 mmol/L (ref 20–32)
Calcium: 10.1 mg/dL (ref 8.6–10.4)
Chloride: 100 mmol/L (ref 98–110)
Creat: 0.79 mg/dL (ref 0.60–0.93)
GFR, Est African American: 84 mL/min/{1.73_m2} (ref >=60)
GFR, Est Non African American: 72 mL/min/{1.73_m2} (ref >=60)
Globulin: 2.6 g/dL (calc) (ref 1.9–3.7)
Glucose, Bld: 88 mg/dL (ref 65–99)
Potassium: 4.3 mmol/L (ref 3.5–5.3)
Sodium: 138 mmol/L (ref 135–146)
Total Bilirubin: 0.5 mg/dL (ref 0.2–1.2)
Total Protein: 7.3 g/dL (ref 6.1–8.1)

## 2020-10-11 LAB — CBC WITH DIFFERENTIAL/PLATELET
Basophils Relative: 0.9 %
Eosinophils Absolute: 152 cells/uL (ref 15–500)
Eosinophils Relative: 1.6 %
Hemoglobin: 14.5 g/dL (ref 11.7–15.5)
Lymphs Abs: 1805 cells/uL (ref 850–3900)
MCHC: 33.3 g/dL (ref 32.0–36.0)
Monocytes Relative: 7.3 %
Neutro Abs: 6764 cells/uL (ref 1500–7800)
Neutrophils Relative %: 71.2 %
Platelets: 354 10*3/uL (ref 140–400)
RDW: 12.7 % (ref 11.0–15.0)
Total Lymphocyte: 19 %
WBC: 9.5 10*3/uL (ref 3.8–10.8)

## 2020-10-11 LAB — HEPATITIS C ANTIBODY
Hepatitis C Ab: NONREACTIVE
SIGNAL TO CUT-OFF: 0.01 (ref <1.00)

## 2020-10-19 DIAGNOSIS — R69 Illness, unspecified: Secondary | ICD-10-CM | POA: Diagnosis not present

## 2020-10-24 DIAGNOSIS — D3131 Benign neoplasm of right choroid: Secondary | ICD-10-CM | POA: Diagnosis not present

## 2020-10-24 DIAGNOSIS — Z961 Presence of intraocular lens: Secondary | ICD-10-CM | POA: Diagnosis not present

## 2020-10-24 DIAGNOSIS — H35372 Puckering of macula, left eye: Secondary | ICD-10-CM | POA: Diagnosis not present

## 2020-10-24 DIAGNOSIS — H524 Presbyopia: Secondary | ICD-10-CM | POA: Diagnosis not present

## 2020-12-05 ENCOUNTER — Encounter: Payer: Self-pay | Admitting: Family Medicine

## 2020-12-06 ENCOUNTER — Telehealth: Payer: Self-pay | Admitting: Family Medicine

## 2020-12-06 DIAGNOSIS — I10 Essential (primary) hypertension: Secondary | ICD-10-CM | POA: Diagnosis not present

## 2020-12-06 DIAGNOSIS — N343 Urethral syndrome, unspecified: Secondary | ICD-10-CM | POA: Diagnosis not present

## 2020-12-06 DIAGNOSIS — N3001 Acute cystitis with hematuria: Secondary | ICD-10-CM | POA: Diagnosis not present

## 2020-12-06 NOTE — Telephone Encounter (Signed)
Left message for patient to call back and schedule Medicare Annual Wellness Visit (AWV) either virtually OR in office.   Last AWV 04/02/19; please schedule at anytime with [facility]Nurse Health Advisor at [facility].  This should be a 45 minute visit.

## 2021-01-17 ENCOUNTER — Ambulatory Visit: Payer: Medicare HMO | Admitting: Dermatology

## 2021-01-23 ENCOUNTER — Encounter: Payer: Self-pay | Admitting: Gastroenterology

## 2021-01-23 ENCOUNTER — Ambulatory Visit: Payer: Medicare HMO | Admitting: Gastroenterology

## 2021-01-23 VITALS — BP 150/60 | HR 61 | Ht 60.0 in | Wt 147.0 lb

## 2021-01-23 DIAGNOSIS — Z86010 Personal history of colonic polyps: Secondary | ICD-10-CM | POA: Diagnosis not present

## 2021-01-23 NOTE — Progress Notes (Signed)
    History of Present Illness: This is a 80 year old female referred by Donna Flaming, MD for the evaluation of personal history of adenomatous colon polyps.  She returns today to discuss surveillance colonoscopy and other options.  She has no ongoing gastrointestinal complaints.  She states that it is often difficult to gain IV access and at her last colonoscopy it took 3 attempts. Denies weight loss, abdominal pain, constipation, diarrhea, change in stool caliber, melena, hematochezia, nausea, vomiting, dysphagia, reflux symptoms, chest pain.   Colonoscopy May 2016 1. Two sessile polyps in the descending colon; polypectomies performed with a cold snare and with cold forceps 2. Moderate diverticulosis in the sigmoid colon and descending colon 3. Mild diverticulosis in the transverse colon and ascending colon 4. Grade l internal hemorrhoids   Review of Systems: Pertinent positive and negative review of systems were noted in the above HPI section. All other review of systems were otherwise negative.   Physical Exam: General: Well developed, well nourished, no acute distress Head: Normocephalic and atraumatic Eyes: Sclerae anicteric, EOMI Ears: Normal auditory acuity Mouth: Not examined, mask on during Covid-19 pandemic Neck: Supple, no masses or thyromegaly Lungs: Clear throughout to auscultation Heart: Regular rate and rhythm; no murmurs, rubs or bruits Abdomen: Soft, non tender and non distended. No masses, hepatosplenomegaly or hernias noted. Normal Bowel sounds Rectal: Not done Musculoskeletal: Symmetrical with no gross deformities  Skin: No lesions on visible extremities Pulses:  Normal pulses noted Extremities: No clubbing, cyanosis, edema or deformities noted Neurological: Alert oriented x 4, grossly nonfocal Cervical Nodes:  No significant cervical adenopathy Inguinal Nodes: No significant inguinal adenopathy Psychological:  Alert and cooperative. Normal mood and  affect   Assessment and Recommendations:  1.  Personal history of adenomatous colon polyps.  We discussed the options CT colonoscopy, ACBE or colonoscopy.  She is not a candidate for Cologuard due to her history of precancerous colon polyps.  CT colonoscopy and ACBE have been disadvantage of requiring colonoscopy if a polyp or other abnormality is found.  Her overall health is good.  I encouraged her to consider proceeding with colonoscopy however given her age above 50 with only 3 prior small precancerous colon polyps it is reasonable to discontinue surveillance colonoscopies.  Her current decision is not to proceed with colonoscopy.  She can call us if she changes her mind. GI follow prn.    cc: Donna Flaming, MD [address] [place],  [place] [address]

## 2021-01-23 NOTE — Patient Instructions (Signed)
Please call our office back if you decide you want to schedule a colonoscopy.   Thank you for choosing me and [facility].  Pricilla Riffle. Dagoberto Ligas., MD., [place]

## 2021-01-31 DIAGNOSIS — H01005 Unspecified blepharitis left lower eyelid: Secondary | ICD-10-CM | POA: Diagnosis not present

## 2021-01-31 DIAGNOSIS — D485 Neoplasm of uncertain behavior of skin: Secondary | ICD-10-CM | POA: Diagnosis not present

## 2021-01-31 DIAGNOSIS — H0012 Chalazion right lower eyelid: Secondary | ICD-10-CM | POA: Diagnosis not present

## 2021-02-14 ENCOUNTER — Ambulatory Visit: Payer: Medicare HMO | Admitting: Dermatology

## 2021-02-14 ENCOUNTER — Other Ambulatory Visit: Payer: Self-pay

## 2021-02-14 ENCOUNTER — Encounter: Payer: Self-pay | Admitting: Dermatology

## 2021-02-14 DIAGNOSIS — R238 Other skin changes: Secondary | ICD-10-CM

## 2021-02-14 DIAGNOSIS — Z1283 Encounter for screening for malignant neoplasm of skin: Secondary | ICD-10-CM | POA: Diagnosis not present

## 2021-02-14 DIAGNOSIS — L608 Other nail disorders: Secondary | ICD-10-CM

## 2021-02-14 DIAGNOSIS — Z86018 Personal history of other benign neoplasm: Secondary | ICD-10-CM

## 2021-02-14 DIAGNOSIS — Z87898 Personal history of other specified conditions: Secondary | ICD-10-CM

## 2021-02-14 DIAGNOSIS — L738 Other specified follicular disorders: Secondary | ICD-10-CM

## 2021-02-14 DIAGNOSIS — D1801 Hemangioma of skin and subcutaneous tissue: Secondary | ICD-10-CM

## 2021-02-14 DIAGNOSIS — Z85820 Personal history of malignant melanoma of skin: Secondary | ICD-10-CM

## 2021-02-14 DIAGNOSIS — L821 Other seborrheic keratosis: Secondary | ICD-10-CM

## 2021-02-14 DIAGNOSIS — L570 Actinic keratosis: Secondary | ICD-10-CM

## 2021-02-21 ENCOUNTER — Other Ambulatory Visit: Payer: Self-pay | Admitting: Family Medicine

## 2021-02-24 ENCOUNTER — Encounter: Payer: Self-pay | Admitting: Dermatology

## 2021-02-24 NOTE — Progress Notes (Signed)
   Follow-Up Visit   Subjective  Donna Andersen is a 80 y.o. female who presents for the following: Annual Exam (Full body skin check. Lesion on the bottom of left eyelid, x month, patients eye doctor gave erythromycin 3 weeks ago. Patient believes it has helped dry it out. Dark spot with ridge on right side of cheek x months,no bleeding, not tender, patient just doesn't like the way it feels. Lesion on nose that peels all off the time, scaly. Scalp is scaling a lot. Right big toenail "looks funny". Finger nails have started splitting x 6 months. ).  Multiple skin issues Location:  Duration:  Quality:  Associated Signs/Symptoms: Modifying Factors:  Severity:  Timing: Context:   Objective  Well appearing patient in no apparent distress; mood and affect are within normal limits. Objective  Right Upper Back: History of multiple dysplastic moles; no recurrent pigmentation.  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  Possible cyst/lesion right eye is being watched and treated by Ophthalmologists.    Objective  Neck - Posterior: No sign repigmentation, regional lymph nodes clear at the visit today, scar clear  Objective  Right Malar Cheek: Right cheek discolored 2 mm papule with eccentric dell  Objective  Head - Anterior (Face): Soft compressible 5 mm blue dermal papule.  Anoscopy shows benign vascular lesion.  Objective  Left Inframammary Fold, Right Inframammary Fold: Lots under both breast,large seb k left side and left breast  Objective  Dorsum of Nose: Be pink for millimeter scaly lesion on bridge of nose  Objective  Right Hallux Toe Nail Plate: Split on nail, patient can try Dermanail over the counter disease may also try for her fingernails).    A full examination was performed including scalp, head, eyes, ears, nose, lips, neck, chest, axillae, abdomen, back, buttocks, bilateral upper extremities, bilateral lower  extremities, hands, feet, fingers, toes, fingernails, and toenails. All findings within normal limits unless otherwise noted below.  Areas beneath undergarments not fully examined.   Assessment & Plan    History of atypical nevus Right Upper Back  Recheck as needed change.  Screening exam for skin cancer Chest - Medial [facility])  Keep yearly skin exam  Personal history of malignant melanoma of skin Neck - Posterior  Annual skin examination.  Sebaceous hyperplasia of face Right Malar Cheek  Leave if stable; discussed the similarity in appearance of an early basal cell skin cancer.  Venous [place] Head - Anterior (Face)  Leave if stable.  Cherry angioma (2) Right Knee - Anterior; Left Abdomen (side) - Lower  Seborrheic keratosis (2) Left Inframammary Fold; Right Inframammary Fold  Safe to leave  AK (actinic keratosis) Dorsum of Nose  Destruction of lesion - Dorsum of Nose Complexity: simple   Destruction method: cryotherapy   Informed consent: discussed and consent obtained   Timeout:  patient name, date of birth, surgical site, and procedure verified Lesion destroyed using liquid nitrogen: Yes   Cryotherapy cycles:  4 Outcome: patient tolerated procedure well with no complications   Post-procedure details: wound care instructions given    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      I, Lavonna Monarch, MD, have reviewed all documentation for this visit.  The documentation on 02/24/21 for the exam, diagnosis, procedures, and orders are all accurate and complete.

## 2021-03-01 ENCOUNTER — Ambulatory Visit: Payer: Medicare HMO | Admitting: Family Medicine

## 2021-03-06 DIAGNOSIS — R350 Frequency of micturition: Secondary | ICD-10-CM | POA: Diagnosis not present

## 2021-03-06 DIAGNOSIS — Z6829 Body mass index (BMI) 29.0-29.9, adult: Secondary | ICD-10-CM | POA: Diagnosis not present

## 2021-03-06 DIAGNOSIS — Z01419 Encounter for gynecological examination (general) (routine) without abnormal findings: Secondary | ICD-10-CM | POA: Diagnosis not present

## 2021-03-07 ENCOUNTER — Encounter: Payer: Self-pay | Admitting: Family Medicine

## 2021-03-08 DIAGNOSIS — D485 Neoplasm of uncertain behavior of skin: Secondary | ICD-10-CM | POA: Diagnosis not present

## 2021-03-15 ENCOUNTER — Ambulatory Visit (INDEPENDENT_AMBULATORY_CARE_PROVIDER_SITE_OTHER): Payer: Medicare HMO

## 2021-03-15 ENCOUNTER — Encounter: Payer: Self-pay | Admitting: Podiatry

## 2021-03-15 ENCOUNTER — Other Ambulatory Visit: Payer: Self-pay

## 2021-03-15 ENCOUNTER — Ambulatory Visit: Payer: Medicare HMO | Admitting: Podiatry

## 2021-03-15 DIAGNOSIS — M7662 Achilles tendinitis, left leg: Secondary | ICD-10-CM | POA: Diagnosis not present

## 2021-03-15 DIAGNOSIS — M79672 Pain in left foot: Secondary | ICD-10-CM

## 2021-03-15 DIAGNOSIS — M79671 Pain in right foot: Secondary | ICD-10-CM | POA: Diagnosis not present

## 2021-03-15 MED ORDER — TRIAMCINOLONE ACETONIDE 10 MG/ML IJ SUSP: 10.0000 mg | Freq: Once | INTRAMUSCULAR | Status: DC

## 2021-03-15 NOTE — Progress Notes (Signed)
Subjective:   Patient ID: Donna Andersen, female   DOB: 80 y.o.   MRN: [id number]   HPI Patient states that she has had a lot of pain in the back of her left heel and this is been going on for a long time and states it becomes red and swollen if she wear shoe gear.  States its been going on over a month.  And is gradually becoming worse and patient does not smoke likes to be active   Review of Systems  All other systems reviewed and are negative.       Objective:  Physical Exam Vitals and nursing note reviewed.  Constitutional:      Appearance: She is well-developed.  Pulmonary:     Effort: Pulmonary effort is normal.  Musculoskeletal:        General: Normal range of motion.  Skin:    General: Skin is warm.  Neurological:     Mental Status: She is alert.     Neurovascular status intact muscle strength adequate range of motion adequate with posterior inflammation of the left Achilles medial side with fluid buildup painful when pressed with patient found to have good digital perfusion well oriented x3 and no tendon strength loss     Assessment:  Achilles tendinitis left medial side painful     Plan:  H&P x-ray reviewed condition discussed and I recommended Achilles tendon injection explaining procedure and the risk of doing this and the chances associated with this which could create rupture of the tendon.  Patient is willing to accept the risk of this surgery understand what would be done and at this point I did a sterile prep injected the medial side 3 mg dexamethasone Kenalog 5 mg Xylocaine advised on ice therapy anti-inflammatories and reappoint to recheck  X-rays indicate small spur no indication stress fracture arthritis

## 2021-03-15 NOTE — Patient Instructions (Signed)

## 2021-03-27 ENCOUNTER — Other Ambulatory Visit: Payer: Self-pay

## 2021-03-27 ENCOUNTER — Ambulatory Visit (INDEPENDENT_AMBULATORY_CARE_PROVIDER_SITE_OTHER): Payer: Medicare HMO | Admitting: Family Medicine

## 2021-03-27 ENCOUNTER — Encounter: Payer: Self-pay | Admitting: Family Medicine

## 2021-03-27 VITALS — BP 150/70 | HR 83 | Temp 98.4°F | Ht 60.0 in | Wt 143.8 lb

## 2021-03-27 DIAGNOSIS — I10 Essential (primary) hypertension: Secondary | ICD-10-CM

## 2021-03-27 DIAGNOSIS — E785 Hyperlipidemia, unspecified: Secondary | ICD-10-CM

## 2021-03-27 DIAGNOSIS — E559 Vitamin D deficiency, unspecified: Secondary | ICD-10-CM

## 2021-03-27 DIAGNOSIS — E890 Postprocedural hypothyroidism: Secondary | ICD-10-CM

## 2021-03-27 LAB — MICROALBUMIN / CREATININE URINE RATIO
Creatinine,U: 54.9 mg/dL
Microalb Creat Ratio: 1.3 mg/g (ref 0.0–30.0)
Microalb, Ur: <0.7 mg/dL (ref 0.0–1.9)

## 2021-03-27 LAB — CBC WITH DIFFERENTIAL/PLATELET
Basophils Absolute: 0.1 10*3/uL (ref 0.0–0.1)
Basophils Relative: 1.1 % (ref 0.0–3.0)
Eosinophils Absolute: 0.1 10*3/uL (ref 0.0–0.7)
Eosinophils Relative: 1.3 % (ref 0.0–5.0)
HCT: 40.9 % (ref 36.0–46.0)
Hemoglobin: 13.7 g/dL (ref 12.0–15.0)
Lymphocytes Relative: 20.2 % (ref 12.0–46.0)
Lymphs Abs: 1.7 10*3/uL (ref 0.7–4.0)
MCHC: 33.6 g/dL (ref 30.0–36.0)
MCV: 90.2 fl (ref 78.0–100.0)
Monocytes Absolute: 0.5 10*3/uL (ref 0.1–1.0)
Monocytes Relative: 6.4 % (ref 3.0–12.0)
Neutro Abs: 5.8 10*3/uL (ref 1.4–7.7)
Neutrophils Relative %: 71 % (ref 43.0–77.0)
Platelets: 293 10*3/uL (ref 150.0–400.0)
RBC: 4.53 Mil/uL (ref 3.87–5.11)
RDW: 13.5 % (ref 11.5–15.5)
WBC: 8.2 10*3/uL (ref 4.0–10.5)

## 2021-03-27 LAB — COMPREHENSIVE METABOLIC PANEL
ALT: 13 U/L (ref 0–35)
AST: 15 U/L (ref 0–37)
Albumin: 4.5 g/dL (ref 3.5–5.2)
Alkaline Phosphatase: 72 U/L (ref 39–117)
BUN: 21 mg/dL (ref 6–23)
CO2: 23 mEq/L (ref 19–32)
Calcium: 9.4 mg/dL (ref 8.4–10.5)
Chloride: 102 mEq/L (ref 96–112)
Creatinine, Ser: 0.84 mg/dL (ref 0.40–1.20)
GFR: 66.75 mL/min (ref >60.00)
Glucose, Bld: 101 mg/dL — ABNORMAL HIGH (ref 70–99)
Potassium: 4.1 mEq/L (ref 3.5–5.1)
Sodium: 138 mEq/L (ref 135–145)
Total Bilirubin: 0.6 mg/dL (ref 0.2–1.2)
Total Protein: 7.2 g/dL (ref 6.0–8.3)

## 2021-03-27 LAB — LIPID PANEL
Cholesterol: 214 mg/dL — ABNORMAL HIGH (ref 0–200)
HDL: 58.5 mg/dL (ref >39.00)
LDL Cholesterol: 126 mg/dL — ABNORMAL HIGH (ref 0–99)
NonHDL: 155.47
Total CHOL/HDL Ratio: 4
Triglycerides: 149 mg/dL (ref 0.0–149.0)
VLDL: 29.8 mg/dL (ref 0.0–40.0)

## 2021-03-27 LAB — T4, FREE: Free T4: 0.88 ng/dL (ref 0.60–1.60)

## 2021-03-27 LAB — TSH: TSH: 1.88 u[IU]/mL (ref 0.35–4.50)

## 2021-03-27 LAB — VITAMIN D 25 HYDROXY (VIT D DEFICIENCY, FRACTURES): VITD: 30.93 ng/mL (ref 30.00–100.00)

## 2021-03-27 MED ORDER — HYDROCHLOROTHIAZIDE 12.5 MG PO CAPS: ORAL_CAPSULE | ORAL | 3 refills | Status: DC

## 2021-03-27 NOTE — Patient Instructions (Addendum)
-   I want you to take vitamin D3 daily at 2000IUday.   -routine labs today -refilled hctz for you today.   -bring your bp log with you for dr. Jonni Sanger to see as well!   I will miss you! Dr. Rogers Blocker

## 2021-03-27 NOTE — Progress Notes (Signed)
Patient: Donna Andersen MRN: [id number] DOB: 1941/10/01 PCP: Donna Flaming, MD     Subjective:  Chief Complaint  Patient presents with  . Hypertension  . Hyperlipidemia  . Vitamin D Deficiency    HPI: The patient is a 80 y.o. female who presents today for routine follow up of HTN, hyperlipidemia and vitamin D deficiency.    Hypertension Here for follow up of hypertension. Currently on benicar 40mg , and hctz 12.5mg  daily. Home readings range [id number] diastolic. Takes medication as prescribed and denies any side effects. She has been taking advil and thinks this has caused her bp to be elevated. Exercise includes pickle ball, walkingWeight has been stable. Denies any chest pain, headaches, shortness of breath, vision changes, swelling in lower extremities.   Vitamin D deficiency.  She doesn't take a vitamin D3 supplement. She does do liquid drops when not in sun.   Hyperlipidemia Currently on crestor 5mg / every other day. She has a lot of statin intolerance, but can tolerate this every other day.  She has history of MI in her father. She has never smoked.  The 10-year ASCVD risk score Donna Bussing DC Brooke Bonito., et al., 2013) is: 37%   Went and talked to Donna Andersen about her adenoma. He typically doesn't do colonoscopies after age 50 years. He discussed she had a risk with having adenoma, but it was up to her. She hasn't decided to do it yet.   Review of Systems  Respiratory: Negative for chest tightness.   Neurological: Negative for dizziness and headaches.    Allergies Patient is allergic to bee venom, atorvastatin, lisinopril, losartan potassium, sulfonamide derivatives, latex, and other.  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  Surgical History Patient  has a past surgical history that includes Abdominal hysterectomy (1974); Thyroid surgery (2011); Breast lumpectomy (1976); Vaginal delivery; Colonoscopy; and Polypectomy.  Family History Pateint's family history includes Cancer in her mother and sister; Hypertension in her mother; Leukemia in her sister; Lymphoma in her sister; Ovarian cancer in her mother; Thyroid disease in her mother.  Social History Patient  reports that she has never smoked. She has never used smokeless tobacco. She reports current alcohol use. She reports that she does not use drugs.    Objective: Vitals:   03/27/21 0850 03/27/21 0922  BP: (!) 160/80 (!) 150/70  Pulse: 83   Temp: 98.4 F (36.9 C)   TempSrc: Temporal   SpO2: 96%   Weight: 143 lb 12.8 oz (65.2 kg)   Height: 5' (1.524 m)     Body mass index is 28.08 kg/m.  Physical Exam Vitals reviewed.  Constitutional:      Appearance: Normal appearance. She is well-developed.  HENT:     Head: Normocephalic and atraumatic.     Right Ear: Tympanic membrane, ear canal and external ear normal.     Left Ear: Tympanic membrane, ear canal and external ear normal.  Eyes:     Conjunctiva/sclera: Conjunctivae normal.     Pupils: Pupils are equal, round, and reactive to light.  Neck:     Thyroid: No thyromegaly.     Vascular: No carotid bruit.  Cardiovascular:     Rate and Rhythm: Normal rate and regular rhythm.     Heart sounds:  Normal heart sounds. No murmur heard.     Comments: No murmur appreciated today  Pulmonary:     Effort: Pulmonary effort is normal.     Breath sounds: Normal breath sounds.  Abdominal:     General: Bowel sounds are normal. There is no distension.     Palpations: Abdomen is soft.     Tenderness: There is no abdominal tenderness.  Musculoskeletal:     Cervical back: Normal range of motion and neck supple.  Lymphadenopathy:     Cervical: No cervical adenopathy.  Skin:    General: Skin is warm and dry.     Capillary Refill: Capillary refill takes less than 2 seconds.     Findings: No rash.  Neurological:     General: No focal deficit present.     Mental Status: She is alert and oriented to person, place, and time.     Cranial Nerves: No cranial nerve deficit.     Coordination: Coordination normal.     Deep Tendon Reflexes: Reflexes normal.  Psychiatric:        Mood and Affect: Mood normal.        Behavior: Behavior normal.    [name] Office Visit from 04/08/2020 in [facility]  PHQ-2 Total Score 0          Assessment/Andersen: 1. Essential hypertension Above goal, but always runs high in office due to white coat HTN. Home  Readings have been to goal and her cuff has been calibrated and is correct. Will stay the course and continue to watch her home readings. Advised she bring her log for [name] on next in 6 months time. Continue healthy lifestyle and exercise. Doing so well for her stated age. Routine labs today. No refills needed.  F/u in 6 months time.  - CBC with Differential/Platelet - Comprehensive metabolic panel - Microalbumin / creatinine urine ratio  2. Dyslipidemia Routine fasting labs today. Can only tolerate statin every other day.  The 10-year ASCVD risk score Donna Bussing DC Jr., et al., 2013) is: 37% - Lipid panel  3. Vitamin D deficiency I want her on daily Vitamin D3 at 2000IU/day. Will check today. Last lipid panel closer to goal.  -  VITAMIN D 25 Hydroxy (Vit-D Deficiency, Fractures)  4. Postsurgical hypothyroidism On no medication. Will check today.  - T4, free - TSH  Has seen her GI doc and they are okay with not doing a colonoscopy if she decides not to do this due to her age. She is still thinking on this. Hx of tubular adenoma in 2017.   Return in about 6 months (around 09/27/2021) for routine htn (fu with dr. Jonni Sanger, prefers MD). Donna Flaming, MD [place]   03/27/2021

## 2021-03-29 ENCOUNTER — Encounter: Payer: Self-pay | Admitting: Family Medicine

## 2021-04-10 ENCOUNTER — Encounter: Payer: Medicare HMO | Admitting: Family Medicine

## 2021-05-10 ENCOUNTER — Encounter: Payer: Medicare HMO | Admitting: Family Medicine

## 2021-05-31 ENCOUNTER — Ambulatory Visit (INDEPENDENT_AMBULATORY_CARE_PROVIDER_SITE_OTHER): Payer: Medicare HMO | Admitting: Physician Assistant

## 2021-05-31 ENCOUNTER — Other Ambulatory Visit: Payer: Self-pay

## 2021-05-31 ENCOUNTER — Encounter: Payer: Self-pay | Admitting: Physician Assistant

## 2021-05-31 VITALS — BP 152/83 | HR 78 | Temp 97.6°F | Ht 60.0 in | Wt 145.2 lb

## 2021-05-31 DIAGNOSIS — L500 Allergic urticaria: Secondary | ICD-10-CM | POA: Diagnosis not present

## 2021-05-31 MED ORDER — METHYLPREDNISOLONE ACETATE 80 MG/ML IJ SUSP
80.0000 mg | Freq: Once | INTRAMUSCULAR | Status: AC
  Administered 2021-05-31: 80 mg via INTRAMUSCULAR

## 2021-05-31 NOTE — Progress Notes (Signed)
Acute Office Visit  Subjective:    Patient ID: Donna Andersen, female    DOB: [birth year], 80 y.o.   MRN: [id number]  Chief Complaint  Patient presents with   Rash    Rash  Patient is in today for rash.  Symptoms started around 05/26/2021.  States that she has itchy red spots that come up across her body during the night, especially worse if she is very hot.  2 days prior to the [place] did [place].  Denies any other changes such as detergents or lotions.  She denies any shortness of breath or chest pain.  No fevers or chills.  Rash came up on her lower abdomen and the back of her thighs this morning, but seems to be better now.  Benadryl cream does not seem to be helping and hydrocortisone cream seem to make the itching worse.  Past Medical History:  Diagnosis Date   Acute cystitis 09/26/2007   recurrent UTI's   Allergy    Atypical nevus 11/17/2002   Upper Center Back - Moderate   Atypical nevus 11/17/2002   Right Lower Abdomen - Moderate   Atypical nevus 04/23/2006   Left Upper Arm - Slight to Moderate   Atypical nevus 04/23/2006   Left Outer Lower Leg - Slight to Moderate   Atypical nevus 04/15/2012   Left Upper Buttock - Mild   Atypical nevus 08/25/2014   Right Inner Knee - Moderate   Atypical nevus 08/25/2014   Left Scapula - Severe   Basal cell carcinoma 08/25/2014   left axilla tx -curet and cautery   COUGH DUE TO ACE INHIBITORS 06/20/2009   FIBROCYSTIC BREAST DISEASE 10/10/2009   GOITER, MULTINODULAR 10/11/2009   Benign L thyroid nodule   Headache(784.0) 06/08/2008   HEMATURIA UNSPECIFIED 10/05/2008   HYPERGLYCEMIA 09/26/2007   HYPERLIPIDEMIA 07/21/2007   HYPERTENSION 11/22/2008   HYPOTHYROIDISM 07/21/2007   INSOMNIA 06/08/2008   Melanoma in situ ([place]) 03/07/2011   Base Of Post Neck   Melanoma in situ ([place]) 05/07/2011   Base Of Post Neck - Clear   MYALGIA 10/23/2010   OSTEOPOROSIS 07/21/2007   WEIGHT GAIN 06/08/2008    Past Surgical  History:  Procedure Laterality Date   ABDOMINAL HYSTERECTOMY  1974   BREAST LUMPECTOMY  1976   benign   COLONOSCOPY     POLYPECTOMY     THYROID SURGERY  2011   left nodule removed   VAGINAL DELIVERY     x1    Family History  Problem Relation Age of Onset   Cancer Mother        Ovarian Cancer   Ovarian cancer Mother    Hypertension Mother    Thyroid disease Mother    Lymphoma Sister    Cancer Sister        Breast Cancer, Kidney Cancer   Leukemia Sister    Colon cancer Neg Hx    Rectal cancer Neg Hx    Stomach cancer Neg Hx     Social History   Socioeconomic History   Marital status: Married    Spouse name: Not on file   Number of children: Not on file   Years of education: Not on file   Highest education level: Not on file  Occupational History   Occupation: Homemaker    Employer: RETIRED  Tobacco Use   Smoking status: Never   Smokeless tobacco: Never  Vaping Use   Vaping Use: Never used  Substance  and Sexual Activity   Alcohol use: Yes    Alcohol/week: 0.0 standard drinks    Comment: socially-2 glasses a week   Drug use: No   Sexual activity: Not on file  Other Topics Concern   Not on file  Social History Narrative   Does not work outside the home   Social Determinants of Health   Financial Resource Strain: Not on file  Food Insecurity: Not on file  Transportation Needs: Not on file  Physical Activity: Not on file  Stress: Not on file  Social Connections: Not on file  Intimate Partner Violence: Not on file    Outpatient Medications Prior to Visit  Medication Sig Dispense Refill   calcium carbonate (OS-CAL - DOSED IN MG OF ELEMENTAL CALCIUM) 1250 MG tablet Take 1 tablet by mouth daily.     EPINEPHrine 0.3 mg/0.3 mL IJ SOAJ injection See admin instructions.     erythromycin ophthalmic ointment at bedtime.     hydrochlorothiazide (MICROZIDE) 12.5 MG capsule TAKE 1 CAPSULE BY MOUTH EVERY DAY 90 capsule 3   olmesartan (BENICAR) 40 MG tablet TAKE 1  TABLET BY MOUTH EVERY DAY 90 tablet 3   rosuvastatin (CRESTOR) 5 MG tablet TAKE '5MG'$  BY MOUTH EVERY OTHER DAY 45 tablet 3   SYMBICORT 80-4.5 MCG/ACT inhaler Inhale into the lungs.     Facility-Administered Medications Prior to Visit  Medication Dose Route Frequency Provider Last Rate Last Admin   triamcinolone acetonide (KENALOG) 10 MG/ML injection 10 mg  10 mg Other Once Regal, Norman S, DPM        Allergies  Allergen Reactions   Bee Venom Hives   Atorvastatin     REACTION: dental pain, myalgias, and headache   Lisinopril     REACTION: cough   Losartan Potassium     REACTION: drowsiness   Sulfonamide Derivatives     REACTION: Rash, Hives, Asthma   Latex Itching and Rash     scars   Other Rash and Hives    shrimp. shrimp - Anaphylaxis (swelling & hives) Tape. Tape - scars, itching from latex tape    Review of Systems  Skin:  Positive for rash.  REFER TO HPI FOR PERTINENT POSITIVES AND NEGATIVES     Objective:    Physical Exam Constitutional:      Appearance: Normal appearance. She is normal weight.  Cardiovascular:     Rate and Rhythm: Normal rate and regular rhythm.     Pulses: Normal pulses.     Heart sounds: Normal heart sounds.  Pulmonary:     Effort: Pulmonary effort is normal. No respiratory distress.     Breath sounds: Normal breath sounds. No wheezing.  Skin:    Comments: She has pictures on her phone that she shows me from last night.  Patches of wheals present across her chest one night.  Currently in the office she has several wheals located on her right lower abdomen.  She also has some faded patches on her forearms.  Neurological:     Mental Status: She is alert.    BP (!) 152/83   Pulse 78   Temp 97.6 F (36.4 C)   Ht 5' (1.524 m)   Wt 145 lb 4 oz (65.9 kg)   LMP  (LMP Unknown)   SpO2 96%   BMI 28.37 kg/m  Wt Readings from Last 3 Encounters:  05/31/21 145 lb 4 oz (65.9 kg)  03/27/21 143 lb 12.8 oz (65.2 kg)  01/23/21 147 lb (66.7 kg)  Health Maintenance Due  Topic Date Due   Zoster Vaccines- Shingrix (1 of 2) Never done   COLONOSCOPY (Pts 45-9yr Insurance coverage will need to be confirmed)  11/05/2020   COVID-19 Vaccine (5 - Booster for Pfizer series) 01/26/2021   DEXA SCAN  04/14/2021    There are no preventive care reminders to display for this patient.   Lab Results  Component Value Date   TSH 1.88 03/27/2021   Lab Results  Component Value Date   WBC 8.2 03/27/2021   HGB 13.7 03/27/2021   HCT 40.9 03/27/2021   MCV 90.2 03/27/2021   PLT 293.0 03/27/2021   Lab Results  Component Value Date   NA 138 03/27/2021   K 4.1 03/27/2021   CO2 23 03/27/2021   GLUCOSE 101 (H) 03/27/2021   BUN 21 03/27/2021   CREATININE 0.84 03/27/2021   BILITOT 0.6 03/27/2021   ALKPHOS 72 03/27/2021   AST 15 03/27/2021   ALT 13 03/27/2021   PROT 7.2 03/27/2021   ALBUMIN 4.5 03/27/2021   CALCIUM 9.4 03/27/2021   GFR 66.75 03/27/2021   Lab Results  Component Value Date   CHOL 214 (H) 03/27/2021   Lab Results  Component Value Date   HDL 58.50 03/27/2021   Lab Results  Component Value Date   LDLCALC 126 (H) 03/27/2021   Lab Results  Component Value Date   TRIG 149.0 03/27/2021   Lab Results  Component Value Date   CHOLHDL 4 03/27/2021   Lab Results  Component Value Date   HGBA1C 5.6 03/07/2018       Assessment & Plan:   Problem List Items Addressed This Visit   None Visit Diagnoses     Allergic urticaria    -  Primary   Relevant Medications   methylPREDNISolone acetate (DEPO-MEDROL) injection 80 mg        Meds ordered this encounter  Medications   methylPREDNISolone acetate (DEPO-MEDROL) injection 80 mg    1. Allergic urticaria Could be secondary to massage oil.  Advised patient to keep as cool as possible and well-hydrated.  Gave her Depo-Medrol 80 mg IM in the office today for additional relief.  She is going to take antihistamines as directed.  She is going to stop using the  Benadryl cream.  She will call back if she does not see any improvement or any change in symptoms.  This note was prepared with assistance of [employer] analyst Occasional wrong-word or sound-a-like substitutions may have occurred due to the inherent limitations of voice recognition software.  Silvio Sausedo M Trevino Wyatt, PA-C

## 2021-05-31 NOTE — Patient Instructions (Addendum)
This is hives, possibly from massage oil. Stop using the Benadryl cream. OK to use oral Benadryl as needed. Take Claritin or Zyrtec once daily for the next 2 weeks. (Avoid the "D") You can also take Pepcid AC twice daily.   KEEP AS COOL AS POSSIBLE. Keep hydrated.  Depomedrol 80 mg IM in office today.  Call if worse or no better.

## 2021-06-19 ENCOUNTER — Encounter: Payer: Medicare HMO | Admitting: Physician Assistant

## 2021-06-26 DIAGNOSIS — J3089 Other allergic rhinitis: Secondary | ICD-10-CM | POA: Diagnosis not present

## 2021-06-26 DIAGNOSIS — J3081 Allergic rhinitis due to animal (cat) (dog) hair and dander: Secondary | ICD-10-CM | POA: Diagnosis not present

## 2021-06-26 DIAGNOSIS — J301 Allergic rhinitis due to pollen: Secondary | ICD-10-CM | POA: Diagnosis not present

## 2021-06-26 DIAGNOSIS — T63451D Toxic effect of venom of hornets, accidental (unintentional), subsequent encounter: Secondary | ICD-10-CM | POA: Diagnosis not present

## 2021-06-27 DIAGNOSIS — M25562 Pain in left knee: Secondary | ICD-10-CM | POA: Diagnosis not present

## 2021-06-27 DIAGNOSIS — M9906 Segmental and somatic dysfunction of lower extremity: Secondary | ICD-10-CM | POA: Diagnosis not present

## 2021-06-27 DIAGNOSIS — M170 Bilateral primary osteoarthritis of knee: Secondary | ICD-10-CM | POA: Diagnosis not present

## 2021-06-27 DIAGNOSIS — M25461 Effusion, right knee: Secondary | ICD-10-CM | POA: Diagnosis not present

## 2021-06-27 DIAGNOSIS — M25561 Pain in right knee: Secondary | ICD-10-CM | POA: Diagnosis not present

## 2021-07-04 ENCOUNTER — Ambulatory Visit: Payer: Medicare HMO | Admitting: Dermatology

## 2021-07-04 ENCOUNTER — Other Ambulatory Visit: Payer: Self-pay | Admitting: Dermatology

## 2021-07-04 ENCOUNTER — Other Ambulatory Visit: Payer: Self-pay

## 2021-07-04 ENCOUNTER — Encounter: Payer: Self-pay | Admitting: Dermatology

## 2021-07-04 DIAGNOSIS — L309 Dermatitis, unspecified: Secondary | ICD-10-CM | POA: Diagnosis not present

## 2021-07-04 DIAGNOSIS — D485 Neoplasm of uncertain behavior of skin: Secondary | ICD-10-CM

## 2021-07-04 DIAGNOSIS — C4492 Squamous cell carcinoma of skin, unspecified: Secondary | ICD-10-CM

## 2021-07-04 DIAGNOSIS — D0439 Carcinoma in situ of skin of other parts of face: Secondary | ICD-10-CM

## 2021-07-04 HISTORY — DX: Squamous cell carcinoma of skin, unspecified: C44.92

## 2021-07-04 NOTE — Patient Instructions (Addendum)
1% HC ointment for left cheek   Biopsy, Surgery (Curettage) & Surgery (Excision) Aftercare Instructions  1. Okay to remove bandage in 24 hours  2. Wash area with soap and water  3. Apply Vaseline to area twice daily until healed (Not Neosporin)  4. Okay to cover with a Band-Aid to decrease the chance of infection or prevent irritation from clothing; also it's okay to uncover lesion at home.  5. Suture instructions: return to our office in 7-10 or 10-14 days for a nurse visit for suture removal. Variable healing with sutures, if pain or itching occurs call our office. It's okay to shower or bathe 24 hours after sutures are given.  6. The following risks may occur after a biopsy, curettage or excision: bleeding, scarring, discoloration, recurrence, infection (redness, yellow drainage, pain or swelling).  7. For questions, concerns and results call our office at [facility] before 4pm & Friday before 3pm. Biopsy results will be available in 1 week.

## 2021-07-11 ENCOUNTER — Telehealth: Payer: Self-pay

## 2021-07-11 ENCOUNTER — Encounter: Payer: Self-pay | Admitting: Dermatology

## 2021-07-11 NOTE — Telephone Encounter (Signed)
Phone call to patient with her pathology results. Path to patient.

## 2021-07-11 NOTE — Telephone Encounter (Signed)
-----   Message from Lavonna Monarch, MD sent at 07/08/2021  7:34 AM EDT ----- Schedule surgery with Dr. Darene Lamer

## 2021-07-11 NOTE — Progress Notes (Signed)
   Follow-Up Visit   Subjective  Donna Andersen is a 80 y.o. female who presents for the following: Skin Problem (Patient here today for lesion on her right jaw line x 3 weeks per patient the lesion has a scab on it that will not come off. Per patient no pain, no bleeding. ).  Crust on right cheek, rash on left cheek Location:  Duration:  Quality:  Associated Signs/Symptoms: Modifying Factors:  Severity:  Timing: Context:   Objective  Well appearing patient in no apparent distress; mood and affect are within normal limits. Right Buccal Cheek Pink tan 8 mm crust, rule out superficial carcinoma     Left Malar Cheek Nonspecific subtle inflammation which may be a mixture of dermatitis plus minor UV damage.    A focused examination was performed including head and neck.. Relevant physical exam findings are noted in the Assessment and Plan.   Assessment & Plan    Neoplasm of uncertain behavior of skin Right Buccal Cheek  Skin / nail biopsy Type of biopsy: tangential   Informed consent: discussed and consent obtained   Timeout: patient name, date of birth, surgical site, and procedure verified   Anesthesia: the lesion was anesthetized in a standard fashion   Anesthetic:  1% lidocaine w/ epinephrine 1-100,000 local infiltration Instrument used: flexible razor blade   Hemostasis achieved with: ferric subsulfate   Outcome: patient tolerated procedure well   Post-procedure details: wound care instructions given    Specimen 1 - Surgical pathology Differential Diagnosis: scc vs bcc  Check Margins: No  Dermatitis Left Malar Cheek  We will initially try 1% otc hydrocortisone ointment nightly for 2 to 3 weeks.      I, Lavonna Monarch, MD, have reviewed all documentation for this visit.  The documentation on 07/11/21 for the exam, diagnosis, procedures, and orders are all accurate and complete.

## 2021-07-19 DIAGNOSIS — R351 Nocturia: Secondary | ICD-10-CM | POA: Diagnosis not present

## 2021-07-19 DIAGNOSIS — R350 Frequency of micturition: Secondary | ICD-10-CM | POA: Diagnosis not present

## 2021-07-19 DIAGNOSIS — N390 Urinary tract infection, site not specified: Secondary | ICD-10-CM | POA: Diagnosis not present

## 2021-08-10 DIAGNOSIS — R3121 Asymptomatic microscopic hematuria: Secondary | ICD-10-CM | POA: Diagnosis not present

## 2021-08-10 DIAGNOSIS — N3000 Acute cystitis without hematuria: Secondary | ICD-10-CM | POA: Diagnosis not present

## 2021-08-10 DIAGNOSIS — D49512 Neoplasm of unspecified behavior of left kidney: Secondary | ICD-10-CM | POA: Diagnosis not present

## 2021-08-11 ENCOUNTER — Other Ambulatory Visit: Payer: Self-pay | Admitting: Adult Health

## 2021-08-11 DIAGNOSIS — D4102 Neoplasm of uncertain behavior of left kidney: Secondary | ICD-10-CM

## 2021-08-14 ENCOUNTER — Other Ambulatory Visit (HOSPITAL_COMMUNITY): Payer: Self-pay | Admitting: Adult Health

## 2021-08-14 ENCOUNTER — Ambulatory Visit (HOSPITAL_COMMUNITY)
Admission: RE | Admit: 2021-08-14 | Discharge: 2021-08-14 | Disposition: A | Discharge status: Home / Self Care | Payer: Medicare HMO | Source: Ambulatory Visit | Attending: Adult Health | Admitting: Adult Health

## 2021-08-14 DIAGNOSIS — D4102 Neoplasm of uncertain behavior of left kidney: Secondary | ICD-10-CM

## 2021-08-14 DIAGNOSIS — J984 Other disorders of lung: Secondary | ICD-10-CM | POA: Diagnosis not present

## 2021-08-14 DIAGNOSIS — M5134 Other intervertebral disc degeneration, thoracic region: Secondary | ICD-10-CM | POA: Diagnosis not present

## 2021-08-14 DIAGNOSIS — C642 Malignant neoplasm of left kidney, except renal pelvis: Secondary | ICD-10-CM | POA: Diagnosis not present

## 2021-08-15 ENCOUNTER — Ambulatory Visit
Admission: RE | Admit: 2021-08-15 | Discharge: 2021-08-15 | Disposition: A | Discharge status: Home / Self Care | Payer: Medicare HMO | Source: Ambulatory Visit | Attending: Adult Health | Admitting: Adult Health

## 2021-08-15 ENCOUNTER — Other Ambulatory Visit: Payer: Self-pay

## 2021-08-15 DIAGNOSIS — D4102 Neoplasm of uncertain behavior of left kidney: Secondary | ICD-10-CM

## 2021-08-15 DIAGNOSIS — N281 Cyst of kidney, acquired: Secondary | ICD-10-CM | POA: Diagnosis not present

## 2021-08-15 DIAGNOSIS — N2889 Other specified disorders of kidney and ureter: Secondary | ICD-10-CM | POA: Diagnosis not present

## 2021-08-15 MED ORDER — GADOBUTROL 1 MMOL/ML IV SOLN
7.0000 mL | Freq: Once | INTRAVENOUS | Status: AC | PRN
  Administered 2021-08-15: 7 mL via INTRAVENOUS

## 2021-08-24 ENCOUNTER — Encounter: Payer: Self-pay | Admitting: Dermatology

## 2021-08-24 ENCOUNTER — Other Ambulatory Visit: Payer: Self-pay

## 2021-08-24 ENCOUNTER — Ambulatory Visit (INDEPENDENT_AMBULATORY_CARE_PROVIDER_SITE_OTHER): Payer: Medicare HMO | Admitting: Dermatology

## 2021-08-24 DIAGNOSIS — D0439 Carcinoma in situ of skin of other parts of face: Secondary | ICD-10-CM

## 2021-08-24 DIAGNOSIS — D492 Neoplasm of unspecified behavior of bone, soft tissue, and skin: Secondary | ICD-10-CM

## 2021-08-24 DIAGNOSIS — L820 Inflamed seborrheic keratosis: Secondary | ICD-10-CM

## 2021-08-24 DIAGNOSIS — D099 Carcinoma in situ, unspecified: Secondary | ICD-10-CM

## 2021-08-24 DIAGNOSIS — D489 Neoplasm of uncertain behavior, unspecified: Secondary | ICD-10-CM

## 2021-08-29 DIAGNOSIS — D49512 Neoplasm of unspecified behavior of left kidney: Secondary | ICD-10-CM | POA: Diagnosis not present

## 2021-09-04 DIAGNOSIS — M959 Acquired deformity of musculoskeletal system, unspecified: Secondary | ICD-10-CM | POA: Diagnosis not present

## 2021-09-04 DIAGNOSIS — S46211A Strain of muscle, fascia and tendon of other parts of biceps, right arm, initial encounter: Secondary | ICD-10-CM | POA: Diagnosis not present

## 2021-09-06 ENCOUNTER — Other Ambulatory Visit: Payer: Self-pay | Admitting: Urology

## 2021-09-07 ENCOUNTER — Encounter: Payer: Self-pay | Admitting: Family Medicine

## 2021-09-07 ENCOUNTER — Ambulatory Visit (INDEPENDENT_AMBULATORY_CARE_PROVIDER_SITE_OTHER): Payer: Medicare HMO | Admitting: Family Medicine

## 2021-09-07 ENCOUNTER — Other Ambulatory Visit: Payer: Self-pay

## 2021-09-07 VITALS — BP 125/76 | HR 77 | Temp 98.1°F | Ht 60.0 in | Wt 141.2 lb

## 2021-09-07 DIAGNOSIS — E782 Mixed hyperlipidemia: Secondary | ICD-10-CM

## 2021-09-07 DIAGNOSIS — I10 Essential (primary) hypertension: Secondary | ICD-10-CM

## 2021-09-07 DIAGNOSIS — C641 Malignant neoplasm of right kidney, except renal pelvis: Secondary | ICD-10-CM | POA: Diagnosis not present

## 2021-09-07 DIAGNOSIS — Z9109 Other allergy status, other than to drugs and biological substances: Secondary | ICD-10-CM | POA: Diagnosis not present

## 2021-09-07 DIAGNOSIS — D126 Benign neoplasm of colon, unspecified: Secondary | ICD-10-CM

## 2021-09-07 DIAGNOSIS — C649 Malignant neoplasm of unspecified kidney, except renal pelvis: Secondary | ICD-10-CM

## 2021-09-07 DIAGNOSIS — C642 Malignant neoplasm of left kidney, except renal pelvis: Secondary | ICD-10-CM | POA: Insufficient documentation

## 2021-09-07 HISTORY — DX: Malignant neoplasm of unspecified kidney, except renal pelvis: C64.9

## 2021-09-07 NOTE — Progress Notes (Addendum)
DUE TO COVID-19 ONLY ONE VISITOR IS ALLOWED TO COME WITH YOU AND STAY IN THE WAITING ROOM ONLY DURING PRE OP AND PROCEDURE DAY OF SURGERY. THE 1 VISITOR  MAY VISIT WITH YOU AFTER SURGERY IN YOUR PRIVATE ROOM DURING VISITING HOURS ONLY!  YOU NEED TO HAVE A COVID 19 TEST ON___11/21/22 ____ @_______ , THIS TEST MUST BE DONE BEFORE SURGERY,  COVID TESTING SITE IS AT [place]. PLEASE REMAIN IN YOUR CAR THIS IS A DRIVER UP TEST. AFTER YOUR COVID TEST PLEASE WEAR A MASK OUT IN PUBLIC AND SOCIAL DISTANCE AND [place] YOUR HANDS FREQUENTLY. PLEASE ASK ALL YOUR CLOSE CONTACTS TO WEAR A MASK OUT IN PUBLIC AND SOCIAL DISTANCE AND [place] HANDS FREQUENTLY ALSO.               Donna Andersen  09/07/2021   Your procedure is scheduled on:  09/27/21   Report to [facility] Main  Entrance   Report to admitting at    1015am      Call this number if you have problems the morning of surgery [phone]    Remember: Do not eat food , candy gum or mints :After Midnight. You may have clear liquids from midnight until __   0930am   CLEAR LIQUID DIET THE DAY BEFORE SURGERY.   CLEAR LIQUID DIET   Foods Allowed                                                                       Coffee and tea, regular and decaf                              Plain Jell-O any favor except red or purple                                            Fruit ices (not with fruit pulp)                                      Iced Popsicles                                     Carbonated beverages, regular and diet                                    Cranberry, grape and apple juices Sports drinks like Gatorade Lightly seasoned clear broth or consume(fat free) Sugar   _____________________________________________________________________    BRUSH YOUR TEETH MORNING OF SURGERY AND RINSE YOUR MOUTH OUT, NO CHEWING GUM CANDY OR MINTS.     Take these medicines the morning of surgery with A SIP OF WATER: Inhalers as  usual and bring   DO NOT TAKE ANY DIABETIC MEDICATIONS DAY OF YOUR SURGERY  You may not have any metal on your body including hair pins and              piercings  Do not wear jewelry, make-up, lotions, powders or perfumes, deodorant             Do not wear nail polish on your fingernails.  Do not shave  48 hours prior to surgery.              Men may shave face and neck.   Do not bring valuables to the hospital. [place].  Contacts, dentures or bridgework may not be worn into surgery.  Leave suitcase in the car. After surgery it may be brought to your room.     Patients discharged the day of surgery will not be allowed to drive home. IF YOU ARE HAVING SURGERY AND GOING HOME THE SAME DAY, YOU MUST HAVE AN ADULT TO DRIVE YOU HOME AND BE WITH YOU FOR 24 HOURS. YOU MAY GO HOME BY TAXI OR UBER OR ORTHERWISE, BUT AN ADULT MUST ACCOMPANY YOU HOME AND STAY WITH YOU FOR 24 HOURS.  Name and phone number of your driver:  Special Instructions: N/A              Please read over the following fact sheets you were given: _____________________________________________________________________  [facility] - Preparing for Surgery Before surgery, you can play an important role.  Because skin is not sterile, your skin needs to be as free of germs as possible.  You can reduce the number of germs on your skin by washing with CHG (chlorahexidine gluconate) soap before surgery.  CHG is an antiseptic cleaner which kills germs and bonds with the skin to continue killing germs even after washing. Please DO NOT use if you have an allergy to CHG or antibacterial soaps.  If your skin becomes reddened/irritated stop using the CHG and inform your nurse when you arrive at Short Stay. Do not shave (including legs and underarms) for at least 48 hours prior to the first CHG shower.  You may shave your face/neck. Please follow these instructions  carefully:  1.  Shower with CHG Soap the night before surgery and the  morning of Surgery.  2.  If you choose to wash your hair, wash your hair first as usual with your  normal  shampoo.  3.  After you shampoo, rinse your hair and body thoroughly to remove the  shampoo.                           4.  Use CHG as you would any other liquid soap.  You can apply chg directly  to the skin and wash                       Gently with a scrungie or clean washcloth.  5.  Apply the CHG Soap to your body ONLY FROM THE NECK DOWN.   Do not use on face/ open                           Wound or open sores. Avoid contact with eyes, ears mouth and genitals (private parts).  Wash face,  Genitals (private parts) with your normal soap.             6.  Wash thoroughly, paying special attention to the area where your surgery  will be performed.  7.  Thoroughly rinse your body with warm water from the neck down.  8.  DO NOT shower/wash with your normal soap after using and rinsing off  the CHG Soap.                9.  Pat yourself dry with a clean towel.            10.  Wear clean pajamas.            11.  Place clean sheets on your bed the night of your first shower and do not  sleep with pets. Day of Surgery : Do not apply any lotions/deodorants the morning of surgery.  Please wear clean clothes to the hospital/surgery center.  FAILURE TO FOLLOW THESE INSTRUCTIONS MAY RESULT IN THE CANCELLATION OF YOUR SURGERY PATIENT SIGNATURE_________________________________  NURSE SIGNATURE__________________________________  ________________________________________________________________________

## 2021-09-07 NOTE — Patient Instructions (Signed)
Please return in 6 months for your annual complete physical; please come fasting.   It was a pleasure meeting you! May things go well with your surgery!  If you have any questions or concerns, please don't hesitate to send me a message via MyChart or call the office at [phone]. Thank you for visiting with [place] today! It's our pleasure caring for you.

## 2021-09-07 NOTE — Progress Notes (Signed)
Anesthesia Review:  PCP: Cardiologist : Chest x-ray : EKG : Echo : Stress test: Cardiac Cath :  Activity level:  Sleep Study/ CPAP : Fasting Blood Sugar :      / Checks Blood Sugar -- times a day:   Blood Thinner/ Instructions /Last Dose: ASA / Instructions/ Last Dose :  

## 2021-09-07 NOTE — Progress Notes (Signed)
Subjective  CC:  Chief Complaint  Patient presents with   Transitions Of Care   Hypertension   Hypothyroidism    HPI: Donna Andersen is a 80 y.o. female who presents to [facility] today to establish care with me as a new patient.   She has the following concerns or needs: Very pleasant 80 year old married female who keeps very active, playing pickle ball daily, presents for transfer of care.  Has chronic medical conditions that are well controlled including hyperlipidemia, hypertension with whitecoat syndrome, environmental allergies.  She has multiple specialist including a gynecologist, urologist, allergist. Her most pressing issue is a new right renal mass found incidentally on renal ultrasound when undergoing work-up for recurrent UTI.  Dr. Lovena Neighbours, urologist, is scheduling a right nephrectomy due to high likelihood of renal cell carcinoma.  She brings in an abdominal MRI which fortunately only shows the right sided renal mass.  She is very worried but ready to have the surgery done.  She has no symptoms from it.  I reviewed MRI and ultrasound and urology notes.  She has normal renal function She feels her other medical problems are well controlled including hypertension.  She does have whitecoat syndrome.  Home readings average 120s over 70s.  She brings in her log for my review.  She has no chest pain or noted heart disease.  No lower extremity edema.  She is intolerant to ACE inhibitors.  She takes Benicar and HCTZ. She tolerates rosuvastatin 5 mg nightly for hyperlipidemia.  LDL is near goal, higher doses give her myalgias. HM: she declines shingrix and flu vaccines; history of tubular adenoma, defers further colorectal cancer screenings given her age.   Assessment  1. Renal cell carcinoma, right ([place])   2. White coat syndrome with diagnosis of hypertension   3. Mixed hyperlipidemia   4. Environmental allergies   5. Tubular adenoma of colon       Plan  Renal cell carcinoma: Counseling and education given.  For right nephrectomy.  We will follow along.  Should be curative. Hypertension is well controlled with home readings, continue hydrochlorothiazide and Benicar.  Normal renal function.  Normal electrolytes. Hyperlipidemia on low-dose Crestor nightly.  Continue Allergies are controlled.  Rare use of Symbicort per allergist Health maintenance: Vaccine counseling done.  She defers vaccinations for now.  We will continue to educate on Shingrix.  No further colonoscopies per her preference and GI is following along.  Had recent normal MRI of the abdomen, outside of renal mass.  Follow up: May 2023 for complete physical with lab work No orders of the defined types were placed in this encounter.  No orders of the defined types were placed in this encounter.    Depression screen [facility] 2/9 09/07/2021 04/08/2020 04/08/2020 09/28/2019 04/02/2019  Decreased Interest 0 0 0 0 0  Down, Depressed, Hopeless 0 0 0 0 0  PHQ - 2 Score 0 0 0 0 0    We updated and reviewed the patient's past history in detail and it is documented below.  Patient Active Problem List   Diagnosis Date Noted   Renal cell carcinoma, right ([place]) 09/07/2021    Right nephrectomy 09/2021. Dr. Lovena Neighbours, [facility]    Tubular adenoma of colon 09/07/2021   Environmental allergies 06/27/2018    Followed at [facility] and asthma. Dr. Fredderick Phenix     Fibrocystic breast changes, bilateral 10/10/2009   ACE inhibitor intolerance 06/20/2009   White coat  syndrome with diagnosis of hypertension 11/22/2008   Mixed hyperlipidemia 07/21/2007    therapy is limited by multiple perceived drug intolerances      Osteopenia 07/21/2007    Fosamax in 2013. Did not tolerate. BMD improved from increased weight bearing exercise on last dexa 04/2018. Does not want other medications. FrAX score was 4.3% for hip. Risks discussed. Okay with continuing calcium and vitamin D     Health  Maintenance  Topic Date Due   Zoster Vaccines- Shingrix (1 of 2) Never done   COVID-19 Vaccine (5 - Booster for Pfizer series) 11/23/2020   DEXA SCAN  04/14/2021   INFLUENZA VACCINE  02/02/2022 (Originally 06/05/2021)   MAMMOGRAM  09/21/2021   Pneumonia Vaccine 54+ Years old  Completed   Hepatitis C Screening  Completed   HPV VACCINES  Aged Out   TETANUS/TDAP  Discontinued   Immunization History  Administered Date(s) Administered   PFIZER(Purple Top)SARS-COV-2 Vaccination 12/10/2019, 01/04/2020, 09/28/2020   Pneumococcal Conjugate-13 02/16/2015   Pneumococcal Polysaccharide-23 10/11/2008   Zoster, Live 02/22/2015   Current Meds  Medication Sig   calcium carbonate (OS-CAL - DOSED IN MG OF ELEMENTAL CALCIUM) 1250 MG tablet Take 1 tablet by mouth every 14 (fourteen) days.   Cholecalciferol (VITAMIN D3) 2400 UNIT/ML LIQD Take 2,400 Units by mouth daily.   EPINEPHrine 0.3 mg/0.3 mL IJ SOAJ injection Inject 0.3 mg into the muscle as needed for anaphylaxis.   hydrochlorothiazide (MICROZIDE) 12.5 MG capsule TAKE 1 CAPSULE BY MOUTH EVERY DAY   olmesartan (BENICAR) 40 MG tablet TAKE 1 TABLET BY MOUTH EVERY DAY (Patient taking differently: Take 40 mg by mouth every evening.)   rosuvastatin (CRESTOR) 5 MG tablet TAKE 5MG  BY MOUTH EVERY OTHER DAY   SYMBICORT 80-4.5 MCG/ACT inhaler Inhale 2 puffs into the lungs daily as needed (allergies).    Allergies: Patient is allergic to bee venom, atorvastatin, lisinopril, losartan potassium, sulfonamide derivatives, latex, and other. Past Medical History Patient  has a past medical history of Acute cystitis (09/26/2007), Allergy, Atypical nevus (11/17/2002), Atypical nevus (11/17/2002), Atypical nevus (04/23/2006), Atypical nevus (04/23/2006), Atypical nevus (04/15/2012), Atypical nevus (08/25/2014), Atypical nevus (08/25/2014), Basal cell carcinoma (08/25/2014), COUGH DUE TO ACE INHIBITORS (06/20/2009), FIBROCYSTIC BREAST DISEASE (10/10/2009), GOITER,  MULTINODULAR (10/11/2009), Headache(784.0) (06/08/2008), HEMATURIA UNSPECIFIED (10/05/2008), HYPERGLYCEMIA (09/26/2007), HYPERLIPIDEMIA (07/21/2007), HYPERTENSION (11/22/2008), HYPOTHYROIDISM (07/21/2007), INSOMNIA (06/08/2008), Melanoma in situ ([place]) (03/07/2011), Melanoma in situ ([place]) (05/07/2011), MYALGIA (10/23/2010), OSTEOPOROSIS (07/21/2007), Renal cell carcinoma ([place]) (09/07/2021), 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 Past Surgical History Patient  has a past surgical history that includes Abdominal hysterectomy (1974); Thyroid surgery (2011); Breast lumpectomy (1976); Vaginal delivery; Colonoscopy; and Polypectomy. Family History: Patient family history includes Cancer in her mother and sister; Hypertension in her mother; Leukemia in her sister; Lymphoma in her sister; Ovarian cancer in her mother; Thyroid disease in her mother. Social History:  Patient  reports that she has never smoked. She has never used smokeless tobacco. She reports current alcohol use. She reports that she does not use drugs.  Review of Systems: Constitutional: negative for fever or malaise Ophthalmic: negative for photophobia, double vision or loss of vision Cardiovascular: negative for chest pain, dyspnea on exertion, or new LE swelling Respiratory: negative for SOB or persistent cough Gastrointestinal: negative for abdominal pain, change in bowel habits or melena Genitourinary: negative for dysuria or gross hematuria Musculoskeletal: negative for new gait disturbance or muscular weakness Integumentary: negative for new or persistent rashes Neurological: negative for TIA or stroke symptoms Psychiatric: negative  for SI or delusions Allergic/Immunologic: negative for hives  Patient Care Team    Relationship Specialty Notifications Start End  Leamon Arnt, MD PCP - General Family Medicine  07/04/21   Newton Pigg, MD  Obstetrics and Gynecology  01/25/12   Ninetta Lights, MD (Inactive) Consulting Physician Orthopedic Surgery  02/16/15   Harold Hedge, Darrick Grinder, MD Consulting Physician Allergy and Immunology  09/17/18   Lavonna Monarch, MD Consulting Physician Dermatology  04/02/19   Luberta Mutter, MD Consulting Physician Ophthalmology  04/02/19     Objective  Vitals: BP 125/76 Comment: by consistent home readings  Pulse 77   Temp 98.1 F (36.7 C) (Temporal)   Ht 5' (1.524 m)   Wt 141 lb 3.2 oz (64 kg)   LMP  (LMP Unknown)   SpO2 98%   BMI 27.58 kg/m  General:  Well developed, well nourished, no acute distress  Psych:  Alert and oriented,normal mood and affect HEENT:  Normocephalic, atraumatic, non-icteric sclera, supple neck without adenopathy, mass or thyromegaly Cardiovascular:  RRR without gallop, rub or murmur, no edema Respiratory:  Good breath sounds bilaterally, CTAB with normal respiratory effort Gastrointestinal: normal bowel sounds, soft, non-tender, no noted masses. No HSM    No visits with results within 1 Day(s) from this visit.  Latest known visit with results is:  Office Visit on 03/27/2021  Component Date Value Ref Range Status   WBC 03/27/2021 8.2  4.0 - 10.5 K/uL Final   RBC 03/27/2021 4.53  3.87 - 5.11 Mil/uL Final   Hemoglobin 03/27/2021 13.7  12.0 - 15.0 g/dL Final   HCT 03/27/2021 40.9  36.0 - 46.0 % Final   MCV 03/27/2021 90.2  78.0 - 100.0 fl Final   MCHC 03/27/2021 33.6  30.0 - 36.0 g/dL Final   RDW 03/27/2021 13.5  11.5 - 15.5 % Final   Platelets 03/27/2021 293.0  150.0 - 400.0 K/uL Final   Neutrophils Relative % 03/27/2021 71.0  43.0 - 77.0 % Final   Lymphocytes Relative 03/27/2021 20.2  12.0 - 46.0 % Final   Monocytes Relative 03/27/2021 6.4  3.0 - 12.0 % Final   Eosinophils Relative 03/27/2021 1.3  0.0 - 5.0 % Final   Basophils Relative 03/27/2021 1.1  0.0 - 3.0 % Final   Neutro Abs 03/27/2021 5.8  1.4 - 7.7 K/uL Final   Lymphs Abs 03/27/2021 1.7  0.7 - 4.0 K/uL Final   Monocytes Absolute 03/27/2021 0.5  0.1 -  1.0 K/uL Final   Eosinophils Absolute 03/27/2021 0.1  0.0 - 0.7 K/uL Final   Basophils Absolute 03/27/2021 0.1  0.0 - 0.1 K/uL Final   Sodium 03/27/2021 138  135 - 145 mEq/L Final   Potassium 03/27/2021 4.1  3.5 - 5.1 mEq/L Final   Chloride 03/27/2021 102  96 - 112 mEq/L Final   CO2 03/27/2021 23  19 - 32 mEq/L Final   Glucose, Bld 03/27/2021 101 (A)  70 - 99 mg/dL Final   BUN 03/27/2021 21  6 - 23 mg/dL Final   Creatinine, Ser 03/27/2021 0.84  0.40 - 1.20 mg/dL Final   Total Bilirubin 03/27/2021 0.6  0.2 - 1.2 mg/dL Final   Alkaline Phosphatase 03/27/2021 72  39 - 117 U/L Final   AST 03/27/2021 15  0 - 37 U/L Final   ALT 03/27/2021 13  0 - 35 U/L Final   Total Protein 03/27/2021 7.2  6.0 - 8.3 g/dL Final   Albumin 03/27/2021 4.5  3.5 - 5.2 g/dL  Final   GFR 03/27/2021 66.75  >60.00 mL/min Final   Calcium 03/27/2021 9.4  8.4 - 10.5 mg/dL Final   Cholesterol 03/27/2021 214 (A)  0 - 200 mg/dL Final   Triglycerides 03/27/2021 149.0  0.0 - 149.0 mg/dL Final   HDL 03/27/2021 58.50  >39.00 mg/dL Final   VLDL 03/27/2021 29.8  0.0 - 40.0 mg/dL Final   LDL Cholesterol 03/27/2021 126 (A)  0 - 99 mg/dL Final   Total CHOL/HDL Ratio 03/27/2021 4   Final   NonHDL 03/27/2021 155.47   Final   Microalb, Ur 03/27/2021 <0.7  0.0 - 1.9 mg/dL Final   Creatinine,U 03/27/2021 54.9  mg/dL Final   Microalb Creat Ratio 03/27/2021 1.3  0.0 - 30.0 mg/g Final   VITD 03/27/2021 30.93  30.00 - 100.00 ng/mL Final   Free T4 03/27/2021 0.88  0.60 - 1.60 ng/dL Final   TSH 03/27/2021 1.88  0.35 - 4.50 uIU/mL Final    Commons side effects, risks, benefits, and alternatives for medications and treatment plan prescribed today were discussed, and the patient expressed understanding of the given instructions. Patient is instructed to call or message via MyChart if he/she has any questions or concerns regarding our treatment plan. No barriers to understanding were identified. We discussed Red Flag symptoms and signs in  detail. Patient expressed understanding regarding what to do in case of urgent or emergency type symptoms.  Medication list was reconciled, printed and provided to the patient in AVS. Patient instructions and summary information was reviewed with the patient as documented in the AVS. This note was prepared with assistance of Dragon voice recognition software. Occasional wrong-word or sound-a-like substitutions may have occurred due to the inherent limitations of voice recognition software  This visit occurred during the SARS-CoV-2 public health emergency.  Safety protocols were in place, including screening questions prior to the visit, additional usage of staff PPE, and extensive cleaning of exam room while observing appropriate contact time as indicated for disinfecting solutions.

## 2021-09-09 NOTE — Progress Notes (Signed)
   Follow-Up Visit   Subjective  Donna Andersen is a 80 y.o. female who presents for the following: Procedure (Here for treatment- right buccal cheek- cis x 1).  Biopsy-proven CIS right cheek plus new lesion nearby Location:  Duration:  Quality:  Associated Signs/Symptoms: Modifying Factors:  Severity:  Timing: Context:   Objective  Well appearing patient in no apparent distress; mood and affect are within normal limits. Right Buccal Cheek Biopsy site identified by patient, nurse, and me  Right Buccal Cheek Four millimeter inflamed crust almost contiguous with biopsy-proven BCC.    A focused examination was performed including head and neck. Relevant physical exam findings are noted in the Assessment and Plan.   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  Destruction of lesion Complexity: simple   Destruction method: electrodesiccation and curettage   Informed consent: discussed and consent obtained   Timeout:  patient name, date of birth, surgical site, and procedure verified Anesthesia: the lesion was anesthetized in a standard fashion   Anesthetic:  1% lidocaine w/ epinephrine 1-100,000 local infiltration Curettage performed in three different directions: Yes   Curettage cycles:  3 Lesion length (cm):  1.2 Lesion width (cm):  1.2 Margin per side (cm):  0 Final wound size (cm):  1.2 Hemostasis achieved with:  ferric subsulfate Outcome: patient tolerated procedure well with no complications   Additional details:  Wound innoculated with 5 fluorouracil solution.  There is a second crust just anterior to the biopsy site, this will be biopsied.  Neoplasm of uncertain behavior Right Buccal Cheek  Skin / nail biopsy Type of biopsy: tangential   Informed consent: discussed and consent obtained   Timeout: patient name, date of birth, surgical site, and procedure verified   Procedure prep:  Patient was prepped and draped in  usual sterile fashion (Non sterile) Prep type:  Chlorhexidine Anesthesia: the lesion was anesthetized in a standard fashion   Anesthetic:  1% lidocaine w/ epinephrine 1-100,000 local infiltration Instrument used: flexible razor blade   Outcome: patient tolerated procedure well   Post-procedure details: wound care instructions given    Specimen 1 - Surgical pathology Differential Diagnosis: r/o isk  Check Margins: No      I, Lavonna Monarch, MD, have reviewed all documentation for this visit.  The documentation on 09/09/21 for the exam, diagnosis, procedures, and orders are all accurate and complete.

## 2021-09-12 ENCOUNTER — Encounter (HOSPITAL_COMMUNITY): Payer: Self-pay | Admitting: Urology

## 2021-09-12 ENCOUNTER — Encounter (HOSPITAL_COMMUNITY)
Admission: RE | Admit: 2021-09-12 | Discharge: 2021-09-12 | Disposition: A | Discharge status: Home / Self Care | Payer: Medicare HMO | Source: Ambulatory Visit | Attending: Urology | Admitting: Urology

## 2021-09-12 ENCOUNTER — Other Ambulatory Visit: Payer: Self-pay | Admitting: Urology

## 2021-09-12 ENCOUNTER — Other Ambulatory Visit: Payer: Self-pay

## 2021-09-12 VITALS — BP 164/73 | HR 71 | Temp 97.8°F | Resp 18 | Ht 60.0 in | Wt 140.0 lb

## 2021-09-12 DIAGNOSIS — Z01818 Encounter for other preprocedural examination: Secondary | ICD-10-CM | POA: Diagnosis not present

## 2021-09-12 DIAGNOSIS — C641 Malignant neoplasm of right kidney, except renal pelvis: Secondary | ICD-10-CM

## 2021-09-12 LAB — COMPREHENSIVE METABOLIC PANEL
ALT: 13 U/L (ref 0–44)
AST: 14 U/L — ABNORMAL LOW (ref 15–41)
Albumin: 4 g/dL (ref 3.5–5.0)
Alkaline Phosphatase: 65 U/L (ref 38–126)
Anion gap: 8 (ref 5–15)
BUN: 18 mg/dL (ref 8–23)
CO2: 26 mmol/L (ref 22–32)
Calcium: 9 mg/dL (ref 8.9–10.3)
Chloride: 102 mmol/L (ref 98–111)
Creatinine, Ser: 0.88 mg/dL (ref 0.44–1.00)
GFR, Estimated: >60 mL/min (ref >60)
Glucose, Bld: 104 mg/dL — ABNORMAL HIGH (ref 70–99)
Potassium: 4.2 mmol/L (ref 3.5–5.1)
Sodium: 136 mmol/L (ref 135–145)
Total Bilirubin: 0.9 mg/dL (ref 0.3–1.2)
Total Protein: 7 g/dL (ref 6.5–8.1)

## 2021-09-12 LAB — CBC
HCT: 41.3 % (ref 36.0–46.0)
Hemoglobin: 13.4 g/dL (ref 12.0–15.0)
MCH: 30.5 pg (ref 26.0–34.0)
MCHC: 32.4 g/dL (ref 30.0–36.0)
MCV: 94.1 fL (ref 80.0–100.0)
Platelets: 314 10*3/uL (ref 150–400)
RBC: 4.39 MIL/uL (ref 3.87–5.11)
RDW: 12.9 % (ref 11.5–15.5)
WBC: 8 10*3/uL (ref 4.0–10.5)
nRBC: 0 % (ref 0.0–0.2)

## 2021-09-12 NOTE — Progress Notes (Addendum)
Anesthesia Review:  PCP: DR Tora Perches andy LOV 09/07/21  Cardiologist : none  Chest x-ray :08/15/21  EKG :09/12/21  Echo : Stress test: Cardiac Cath :  Activity level: can do a flight of stairs without difficulty , plays pickle ball daily  Sleep Study/ CPAP : none  Fasting Blood Sugar :      / Checks Blood Sugar -- times a day:   Blood Thinner/ Instructions /Last Dose: ASA / Instructions/ Last Dose :   Covid test on 09/25/21

## 2021-09-15 ENCOUNTER — Encounter (HOSPITAL_COMMUNITY): Payer: Self-pay | Admitting: Radiology

## 2021-09-25 ENCOUNTER — Other Ambulatory Visit: Payer: Self-pay | Admitting: Urology

## 2021-09-25 LAB — SARS CORONAVIRUS 2 (TAT 6-24 HRS): SARS Coronavirus 2: NEGATIVE

## 2021-09-26 ENCOUNTER — Encounter: Payer: Medicare HMO | Admitting: Family Medicine

## 2021-09-27 ENCOUNTER — Inpatient Hospital Stay (HOSPITAL_COMMUNITY): Payer: Medicare HMO | Admitting: Anesthesiology

## 2021-09-27 ENCOUNTER — Encounter (HOSPITAL_COMMUNITY): Payer: Self-pay | Admitting: Urology

## 2021-09-27 ENCOUNTER — Encounter (HOSPITAL_COMMUNITY)
Admission: RE | Disposition: A | Discharge status: Home / Self Care | Payer: Self-pay | Source: Ambulatory Visit | Attending: Urology

## 2021-09-27 ENCOUNTER — Inpatient Hospital Stay (HOSPITAL_COMMUNITY)
Admission: RE | Admit: 2021-09-27 | Discharge: 2021-09-29 | DRG: 661 | Disposition: A | Discharge status: Home / Self Care | Payer: Medicare HMO | Source: Ambulatory Visit | Attending: Urology | Admitting: Urology

## 2021-09-27 ENCOUNTER — Other Ambulatory Visit: Payer: Self-pay

## 2021-09-27 ENCOUNTER — Inpatient Hospital Stay (HOSPITAL_COMMUNITY): Payer: Medicare HMO | Admitting: Physician Assistant

## 2021-09-27 DIAGNOSIS — Z91013 Allergy to seafood: Secondary | ICD-10-CM | POA: Diagnosis not present

## 2021-09-27 DIAGNOSIS — Z8051 Family history of malignant neoplasm of kidney: Secondary | ICD-10-CM | POA: Diagnosis not present

## 2021-09-27 DIAGNOSIS — Z87892 Personal history of anaphylaxis: Secondary | ICD-10-CM | POA: Diagnosis not present

## 2021-09-27 DIAGNOSIS — Z8249 Family history of ischemic heart disease and other diseases of the circulatory system: Secondary | ICD-10-CM

## 2021-09-27 DIAGNOSIS — Z20822 Contact with and (suspected) exposure to covid-19: Secondary | ICD-10-CM | POA: Diagnosis not present

## 2021-09-27 DIAGNOSIS — E782 Mixed hyperlipidemia: Secondary | ICD-10-CM | POA: Diagnosis not present

## 2021-09-27 DIAGNOSIS — Z87440 Personal history of urinary (tract) infections: Secondary | ICD-10-CM

## 2021-09-27 DIAGNOSIS — M8580 Other specified disorders of bone density and structure, unspecified site: Secondary | ICD-10-CM | POA: Diagnosis not present

## 2021-09-27 DIAGNOSIS — E039 Hypothyroidism, unspecified: Secondary | ICD-10-CM | POA: Diagnosis not present

## 2021-09-27 DIAGNOSIS — Z90710 Acquired absence of both cervix and uterus: Secondary | ICD-10-CM | POA: Diagnosis not present

## 2021-09-27 DIAGNOSIS — Z888 Allergy status to other drugs, medicaments and biological substances status: Secondary | ICD-10-CM

## 2021-09-27 DIAGNOSIS — Z91030 Bee allergy status: Secondary | ICD-10-CM

## 2021-09-27 DIAGNOSIS — I10 Essential (primary) hypertension: Secondary | ICD-10-CM | POA: Diagnosis not present

## 2021-09-27 DIAGNOSIS — Z91018 Allergy to other foods: Secondary | ICD-10-CM | POA: Diagnosis not present

## 2021-09-27 DIAGNOSIS — N2889 Other specified disorders of kidney and ureter: Principal | ICD-10-CM | POA: Diagnosis present

## 2021-09-27 DIAGNOSIS — Z01818 Encounter for other preprocedural examination: Secondary | ICD-10-CM

## 2021-09-27 DIAGNOSIS — C641 Malignant neoplasm of right kidney, except renal pelvis: Secondary | ICD-10-CM

## 2021-09-27 DIAGNOSIS — C642 Malignant neoplasm of left kidney, except renal pelvis: Secondary | ICD-10-CM | POA: Diagnosis not present

## 2021-09-27 DIAGNOSIS — Z91040 Latex allergy status: Secondary | ICD-10-CM

## 2021-09-27 DIAGNOSIS — Z79899 Other long term (current) drug therapy: Secondary | ICD-10-CM | POA: Diagnosis not present

## 2021-09-27 DIAGNOSIS — D49512 Neoplasm of unspecified behavior of left kidney: Secondary | ICD-10-CM | POA: Diagnosis not present

## 2021-09-27 DIAGNOSIS — Z882 Allergy status to sulfonamides status: Secondary | ICD-10-CM

## 2021-09-27 DIAGNOSIS — J45909 Unspecified asthma, uncomplicated: Secondary | ICD-10-CM | POA: Diagnosis not present

## 2021-09-27 DIAGNOSIS — Z85820 Personal history of malignant melanoma of skin: Secondary | ICD-10-CM | POA: Diagnosis not present

## 2021-09-27 DIAGNOSIS — N281 Cyst of kidney, acquired: Secondary | ICD-10-CM | POA: Diagnosis not present

## 2021-09-27 HISTORY — PX: ROBOT ASSISTED LAPAROSCOPIC NEPHRECTOMY: SHX5140

## 2021-09-27 LAB — TYPE AND SCREEN
ABO/RH(D): O POS
Antibody Screen: NEGATIVE

## 2021-09-27 LAB — ABO/RH: ABO/RH(D): O POS

## 2021-09-27 SURGERY — NEPHRECTOMY, RADICAL, ROBOT-ASSISTED, LAPAROSCOPIC, ADULT
Anesthesia: General | Laterality: Left

## 2021-09-27 MED ORDER — SENNA 8.6 MG PO TABS
1.0000 | ORAL_TABLET | Freq: Two times a day (BID) | ORAL | Status: DC
  Administered 2021-09-27 – 2021-09-29 (×3): 8.6 mg via ORAL
  Filled 2021-09-27 (×4): qty 1

## 2021-09-27 MED ORDER — BUPIVACAINE LIPOSOME 1.3 % IJ SUSP
INTRAMUSCULAR | Status: AC
  Filled 2021-09-27: qty 20

## 2021-09-27 MED ORDER — OXYCODONE HCL 5 MG PO TABS: 5.0000 mg | ORAL_TABLET | Freq: Once | ORAL | Status: DC | PRN

## 2021-09-27 MED ORDER — FENTANYL CITRATE PF 50 MCG/ML IJ SOSY
PREFILLED_SYRINGE | INTRAMUSCULAR | Status: AC
  Filled 2021-09-27: qty 3

## 2021-09-27 MED ORDER — LACTATED RINGERS IV SOLN
INTRAVENOUS | Status: DC | PRN
Start: 2021-09-27 — End: 2021-09-27

## 2021-09-27 MED ORDER — FENTANYL CITRATE (PF) 100 MCG/2ML IJ SOLN
INTRAMUSCULAR | Status: DC | PRN
  Administered 2021-09-27 (×3): 25 ug via INTRAVENOUS
  Administered 2021-09-27: 50 ug via INTRAVENOUS

## 2021-09-27 MED ORDER — ONDANSETRON HCL 4 MG/2ML IJ SOLN: 4.0000 mg | Freq: Once | INTRAMUSCULAR | Status: DC | PRN

## 2021-09-27 MED ORDER — PHENYLEPHRINE 40 MCG/ML (10ML) SYRINGE FOR IV PUSH (FOR BLOOD PRESSURE SUPPORT)
PREFILLED_SYRINGE | INTRAVENOUS | Status: AC
  Filled 2021-09-27: qty 10

## 2021-09-27 MED ORDER — CEFAZOLIN SODIUM-DEXTROSE 2-4 GM/100ML-% IV SOLN
INTRAVENOUS | Status: AC
  Filled 2021-09-27: qty 100

## 2021-09-27 MED ORDER — LACTATED RINGERS IR SOLN
Status: DC | PRN
  Administered 2021-09-27: 1000 mL

## 2021-09-27 MED ORDER — LACTATED RINGERS IV SOLN: INTRAVENOUS | Status: DC

## 2021-09-27 MED ORDER — ONDANSETRON HCL 4 MG/2ML IJ SOLN
INTRAMUSCULAR | Status: AC
  Filled 2021-09-27: qty 2

## 2021-09-27 MED ORDER — LIDOCAINE HCL (PF) 2 % IJ SOLN
INTRAMUSCULAR | Status: AC
  Filled 2021-09-27: qty 5

## 2021-09-27 MED ORDER — ONDANSETRON HCL 4 MG/2ML IJ SOLN
INTRAMUSCULAR | Status: DC | PRN
  Administered 2021-09-27: 4 mg via INTRAVENOUS

## 2021-09-27 MED ORDER — DOCUSATE SODIUM 100 MG PO CAPS: 100.0000 mg | ORAL_CAPSULE | Freq: Two times a day (BID) | ORAL | 0 refills | Status: AC | PRN

## 2021-09-27 MED ORDER — SODIUM CHLORIDE (PF) 0.9 % IJ SOLN
INTRAMUSCULAR | Status: AC
  Filled 2021-09-27: qty 20

## 2021-09-27 MED ORDER — FENTANYL CITRATE (PF) 250 MCG/5ML IJ SOLN
INTRAMUSCULAR | Status: AC
  Filled 2021-09-27: qty 5

## 2021-09-27 MED ORDER — LIDOCAINE 2% (20 MG/ML) 5 ML SYRINGE
INTRAMUSCULAR | Status: DC | PRN
  Administered 2021-09-27: 60 mg via INTRAVENOUS

## 2021-09-27 MED ORDER — OXYCODONE HCL 5 MG/5ML PO SOLN: 5.0000 mg | Freq: Once | ORAL | Status: DC | PRN

## 2021-09-27 MED ORDER — SODIUM CHLORIDE 0.9 % IV SOLN
INTRAVENOUS | Status: DC | PRN
  Administered 2021-09-27: 60 mL

## 2021-09-27 MED ORDER — CEFAZOLIN SODIUM-DEXTROSE 2-4 GM/100ML-% IV SOLN
2.0000 g | Freq: Once | INTRAVENOUS | Status: AC
  Administered 2021-09-27: 2 g via INTRAVENOUS

## 2021-09-27 MED ORDER — ROCURONIUM BROMIDE 100 MG/10ML IV SOLN
INTRAVENOUS | Status: DC | PRN
  Administered 2021-09-27: 60 mg via INTRAVENOUS
  Administered 2021-09-27: 20 mg via INTRAVENOUS

## 2021-09-27 MED ORDER — CHLORHEXIDINE GLUCONATE 0.12 % MT SOLN
15.0000 mL | Freq: Once | OROMUCOSAL | Status: AC
  Administered 2021-09-27: 15 mL via OROMUCOSAL

## 2021-09-27 MED ORDER — ROCURONIUM BROMIDE 10 MG/ML (PF) SYRINGE
PREFILLED_SYRINGE | INTRAVENOUS | Status: AC
  Filled 2021-09-27: qty 10

## 2021-09-27 MED ORDER — ONDANSETRON HCL 4 MG/2ML IJ SOLN: 4.0000 mg | INTRAMUSCULAR | Status: DC | PRN

## 2021-09-27 MED ORDER — MEPERIDINE HCL 50 MG/ML IJ SOLN: 6.2500 mg | INTRAMUSCULAR | Status: DC | PRN

## 2021-09-27 MED ORDER — ROSUVASTATIN CALCIUM 5 MG PO TABS
5.0000 mg | ORAL_TABLET | Freq: Every day | ORAL | Status: DC
  Administered 2021-09-28: 5 mg via ORAL
  Filled 2021-09-27 (×2): qty 1

## 2021-09-27 MED ORDER — MEPERIDINE HCL 50 MG/ML IJ SOLN
6.2500 mg | INTRAMUSCULAR | Status: DC | PRN
Start: 2021-09-27 — End: 2021-09-27

## 2021-09-27 MED ORDER — PHENYLEPHRINE 40 MCG/ML (10ML) SYRINGE FOR IV PUSH (FOR BLOOD PRESSURE SUPPORT)
PREFILLED_SYRINGE | INTRAVENOUS | Status: DC | PRN
  Administered 2021-09-27: 40 ug via INTRAVENOUS

## 2021-09-27 MED ORDER — OXYCODONE HCL 5 MG PO TABS: 5.0000 mg | ORAL_TABLET | ORAL | Status: DC | PRN

## 2021-09-27 MED ORDER — ONDANSETRON HCL 4 MG PO TABS: 4.0000 mg | ORAL_TABLET | Freq: Every day | ORAL | 1 refills | Status: DC | PRN

## 2021-09-27 MED ORDER — MORPHINE SULFATE (PF) 2 MG/ML IV SOLN
2.0000 mg | INTRAVENOUS | Status: DC | PRN
  Administered 2021-09-27: 2 mg via INTRAVENOUS
  Filled 2021-09-27: qty 1

## 2021-09-27 MED ORDER — ACETAMINOPHEN 10 MG/ML IV SOLN
1000.0000 mg | Freq: Four times a day (QID) | INTRAVENOUS | Status: AC
  Administered 2021-09-27 – 2021-09-28 (×4): 1000 mg via INTRAVENOUS
  Filled 2021-09-27 (×4): qty 100

## 2021-09-27 MED ORDER — ACETAMINOPHEN 325 MG PO TABS: 325.0000 mg | ORAL_TABLET | ORAL | Status: DC | PRN

## 2021-09-27 MED ORDER — ACETAMINOPHEN 160 MG/5ML PO SOLN: 325.0000 mg | ORAL | Status: DC | PRN

## 2021-09-27 MED ORDER — HYDROCODONE-ACETAMINOPHEN 5-325 MG PO TABS: 1.0000 | ORAL_TABLET | ORAL | 0 refills | Status: DC | PRN

## 2021-09-27 MED ORDER — STERILE WATER FOR IRRIGATION IR SOLN
Status: DC | PRN
  Administered 2021-09-27: 1000 mL

## 2021-09-27 MED ORDER — ORAL CARE MOUTH RINSE: 15.0000 mL | Freq: Once | OROMUCOSAL | Status: AC

## 2021-09-27 MED ORDER — SODIUM CHLORIDE 0.45 % IV SOLN: INTRAVENOUS | Status: DC

## 2021-09-27 MED ORDER — CHLORHEXIDINE GLUCONATE 0.12 % MT SOLN: 15.0000 mL | Freq: Once | OROMUCOSAL | Status: AC

## 2021-09-27 MED ORDER — DEXAMETHASONE SODIUM PHOSPHATE 10 MG/ML IJ SOLN
INTRAMUSCULAR | Status: DC | PRN
  Administered 2021-09-27: 8 mg via INTRAVENOUS

## 2021-09-27 MED ORDER — ORAL CARE MOUTH RINSE
15.0000 mL | Freq: Once | OROMUCOSAL | Status: AC
  Administered 2021-09-27: 15 mL via OROMUCOSAL

## 2021-09-27 MED ORDER — GLYCOPYRROLATE 0.2 MG/ML IJ SOLN
INTRAMUSCULAR | Status: DC | PRN
  Administered 2021-09-27 (×2): .1 mg via INTRAVENOUS

## 2021-09-27 MED ORDER — FENTANYL CITRATE PF 50 MCG/ML IJ SOSY: 25.0000 ug | PREFILLED_SYRINGE | INTRAMUSCULAR | Status: DC | PRN

## 2021-09-27 MED ORDER — DOCUSATE SODIUM 100 MG PO CAPS
100.0000 mg | ORAL_CAPSULE | Freq: Two times a day (BID) | ORAL | Status: DC
  Administered 2021-09-27 – 2021-09-29 (×3): 100 mg via ORAL
  Filled 2021-09-27 (×4): qty 1

## 2021-09-27 MED ORDER — ACETAMINOPHEN 10 MG/ML IV SOLN
INTRAVENOUS | Status: DC | PRN
  Administered 2021-09-27: 1000 mg via INTRAVENOUS

## 2021-09-27 MED ORDER — FENTANYL CITRATE PF 50 MCG/ML IJ SOSY
25.0000 ug | PREFILLED_SYRINGE | INTRAMUSCULAR | Status: DC | PRN
  Administered 2021-09-27 (×2): 50 ug via INTRAVENOUS

## 2021-09-27 MED ORDER — HYDROCHLOROTHIAZIDE 12.5 MG PO TABS
12.5000 mg | ORAL_TABLET | Freq: Every day | ORAL | Status: DC
  Administered 2021-09-29: 12.5 mg via ORAL
  Filled 2021-09-27 (×3): qty 1

## 2021-09-27 MED ORDER — PROPOFOL 10 MG/ML IV BOLUS
INTRAVENOUS | Status: DC | PRN
  Administered 2021-09-27: 120 mg via INTRAVENOUS

## 2021-09-27 MED ORDER — SUGAMMADEX SODIUM 200 MG/2ML IV SOLN
INTRAVENOUS | Status: DC | PRN
  Administered 2021-09-27: 200 mg via INTRAVENOUS

## 2021-09-27 MED ORDER — ACETAMINOPHEN 10 MG/ML IV SOLN
INTRAVENOUS | Status: AC
  Filled 2021-09-27: qty 100

## 2021-09-27 SURGICAL SUPPLY — 56 items
ADH SKN CLS APL DERMABOND .7 (GAUZE/BANDAGES/DRESSINGS) ×1
APL PRP STRL LF DISP 70% ISPRP (MISCELLANEOUS) ×1
BAG COUNTER SPONGE SURGICOUNT (BAG) IMPLANT
BAG LAPAROSCOPIC 12 15 PORT 16 (BASKET) ×1 IMPLANT
BAG RETRIEVAL 12/15 (BASKET) ×2
BAG SPNG CNTER NS LX DISP (BAG)
CHLORAPREP W/TINT 26 (MISCELLANEOUS) ×2 IMPLANT
CLIP LIGATING HEMO LOK XL GOLD (MISCELLANEOUS) ×2 IMPLANT
CLIP LIGATING HEMO O LOK GREEN (MISCELLANEOUS) ×4 IMPLANT
COVER SURGICAL LIGHT HANDLE (MISCELLANEOUS) ×2 IMPLANT
COVER TIP SHEARS 8 DVNC (MISCELLANEOUS) ×1 IMPLANT
COVER TIP SHEARS 8MM DA VINCI (MISCELLANEOUS) ×2
CUTTER ECHEON FLEX ENDO 45 340 (ENDOMECHANICALS) ×1 IMPLANT
DECANTER SPIKE VIAL GLASS SM (MISCELLANEOUS) ×2 IMPLANT
DERMABOND ADVANCED (GAUZE/BANDAGES/DRESSINGS) ×1
DERMABOND ADVANCED .7 DNX12 (GAUZE/BANDAGES/DRESSINGS) ×1 IMPLANT
DRAPE ARM DVNC X/XI (DISPOSABLE) ×4 IMPLANT
DRAPE COLUMN DVNC XI (DISPOSABLE) ×1 IMPLANT
DRAPE DA VINCI XI ARM (DISPOSABLE) ×8
DRAPE DA VINCI XI COLUMN (DISPOSABLE) ×2
DRAPE INCISE IOBAN 66X45 STRL (DRAPES) ×2 IMPLANT
DRAPE SHEET LG 3/4 BI-LAMINATE (DRAPES) ×2 IMPLANT
ELECT PENCIL ROCKER SW 15FT (MISCELLANEOUS) ×2 IMPLANT
ELECT REM PT RETURN 15FT ADLT (MISCELLANEOUS) ×2 IMPLANT
GLOVE SURG ENC MOIS LTX SZ6.5 (GLOVE) ×2 IMPLANT
GLOVE SURG ENC TEXT LTX SZ7.5 (GLOVE) ×4 IMPLANT
GOWN STRL REUS W/TWL LRG LVL3 (GOWN DISPOSABLE) ×2 IMPLANT
GOWN STRL REUS W/TWL XL LVL3 (GOWN DISPOSABLE) ×4 IMPLANT
HOLDER FOLEY CATH W/STRAP (MISCELLANEOUS) ×2 IMPLANT
IRRIG SUCT STRYKERFLOW 2 WTIP (MISCELLANEOUS) ×2
IRRIGATION SUCT STRKRFLW 2 WTP (MISCELLANEOUS) ×1 IMPLANT
KIT BASIN OR (CUSTOM PROCEDURE TRAY) ×2 IMPLANT
KIT TURNOVER KIT A (KITS) IMPLANT
MARKER SKIN DUAL TIP RULER LAB (MISCELLANEOUS) ×2 IMPLANT
NDL INSUFFLATION 14GA 120MM (NEEDLE) ×1 IMPLANT
NEEDLE INSUFFLATION 14GA 120MM (NEEDLE) ×2 IMPLANT
PROTECTOR NERVE ULNAR (MISCELLANEOUS) ×4 IMPLANT
RELOAD STAPLE 45 2.6 WHT THIN (STAPLE) IMPLANT
SEAL CANN UNIV 5-8 DVNC XI (MISCELLANEOUS) ×3 IMPLANT
SEAL XI 5MM-8MM UNIVERSAL (MISCELLANEOUS) ×6
SET TUBE SMOKE EVAC HIGH FLOW (TUBING) ×2 IMPLANT
SOLUTION ELECTROLUBE (MISCELLANEOUS) ×2 IMPLANT
STAPLE RELOAD 45 WHT (STAPLE) ×3 IMPLANT
STAPLE RELOAD 45MM WHITE (STAPLE) ×6
SUT MNCRL AB 4-0 PS2 18 (SUTURE) ×4 IMPLANT
SUT PDS AB 0 CT1 36 (SUTURE) ×4 IMPLANT
SUT VIC AB 2-0 SH 27 (SUTURE) ×2
SUT VIC AB 2-0 SH 27X BRD (SUTURE) IMPLANT
SUT VICRYL 0 UR6 27IN ABS (SUTURE) ×3 IMPLANT
TOWEL OR 17X26 10 PK STRL BLUE (TOWEL DISPOSABLE) ×2 IMPLANT
TOWEL OR NON WOVEN STRL DISP B (DISPOSABLE) ×2 IMPLANT
TRAY FOLEY MTR SLVR 16FR STAT (SET/KITS/TRAYS/PACK) ×2 IMPLANT
TRAY LAPAROSCOPIC (CUSTOM PROCEDURE TRAY) ×2 IMPLANT
TROCAR BLADELESS OPT 5 100 (ENDOMECHANICALS) IMPLANT
TROCAR XCEL 12X100 BLDLESS (ENDOMECHANICALS) ×2 IMPLANT
WATER STERILE IRR 1000ML POUR (IV SOLUTION) ×2 IMPLANT

## 2021-09-27 NOTE — Op Note (Signed)
Operative Note  Preoperative diagnosis:  1.  Left renal mass  Postoperative diagnosis: 1.  Left renal mass  Procedure(s): 1.  Robot-assisted laparoscopic left radical nephrectomy   Surgeon: Ellison Hughs, MD  Assistants:  Aldine Contes, MD PGY-4  Anesthesia:  General  Complications:  None  EBL:  50 mL  Specimens: 1.  Left kidney and adrenal gland  Drains/Catheters: 1.  Foley catheter  Intraoperative findings:   The left renal hilum was hemostatic following staple ligation  Indication:  Donna Andersen is a 80 y.o. female with a 7.2 cm solid enhancing left renal mass with features concerning for renal cell carcinoma.  She has been consented for the above procedures, voices understanding and wishes to proceed.  Description of procedure:  After informed consent was obtained, the patient was brought to the operating room and general endotracheal anesthesia was administered.  The patient was then placed in the right lateral decubitus position and prepped and draped in usual sterile fashion.  A timeout was performed.  An 8 mm incision was then made lateral to the left rectus muscle at the level of the left 12th rib.  Abdominal access was obtained via a Veress needle.  The abdominal cavity was then insufflated up to 15 mmHg.  An 8 mm port was then introduced into the abdominal cavity.  Inspection of the port entry site by the robotic camera revealed no adjacent organ injury.  We then placed 3 additional 8 mm robotic ports to triangulate the left renal hilum.  A 12 mm assistant port was then placed between the carmera port and 3rd robotic arm.  The white line of Toldt along the descending colon was incised sharply and the colon, along with its mesocolonic fat, was reflected medially until the aorta was identified.  We then made a small window adjacent to the lower pole of the left kidney, identifying the left psoas muscle, left ureter and left gonadal vein.  The left ureter and gonadal  vein were then reflected anteriorly allowing [place] to then incised the perihilar attachments using electrocautery.  We encountered a small lumbar vein adjacent to the insertion of the left gonadal vein into the left renal vein.  This lumbar vein was ligated with hemo-lock clips in 2 places and incised sharply.  This provided [place] excellent exposure to the left renal hilum.    A 45 mm endovascular stapler was then used to ligate the left renal artery and then the left renal vein, achieving excellent hemostasis.  The remaining peri-renal attachments were then excised using a combination of blunt dissection and electrocautery.  The left adrenal gland was taken with the left kidney.  The endovascular stapler was then used to ligate the left gonadal vein and left ureter.  Once the kidney was freely mobile, it was placed in Endo Catch bag to be be retrieved at the conclusion of the case.  The robot was then de-docked.  A left lower quadrant Gibson incision was then made and the mass was removed within the Endo Catch bag.  The fascia within the midline assistant port was then closed using an interrupted 0 Vicryl suture.  The fascia of the internal and external oblique was then closed using a 0 PDS suture in a running fashion.  The subcutaneous tissue within the [facility] incision was then closed using a running 0 Vicryl suture.  All skin incisions were then closed using 4-0 Monocryl and then dressed with Dermabond.  The patient tolerated the procedure well and was  transferred to the postanesthesia in stable condition.     Plan: Monitor on the floor overnight.  Foley out in the morning.  Advance diet as tolerated.

## 2021-09-27 NOTE — Transfer of Care (Signed)
Immediate Anesthesia Transfer of Care Note  Patient: [place]  Procedure(s) Performed: XI ROBOTIC ASSISTED LAPAROSCOPIC RADICAL NEPHRECTOMY (Left)  Patient Location: PACU  Anesthesia Type:General  Level of Consciousness: awake, alert  and oriented  Airway & Oxygen Therapy: Patient Spontanous Breathing  Post-op Assessment: Report given to RN and Post -op Vital signs reviewed and stable  Post vital signs: Reviewed and stable  Last Vitals:  Vitals Value Taken Time  BP 160/76 09/27/21 1545  Temp 36.6 C 09/27/21 1545  Pulse 76 09/27/21 1550  Resp 27 09/27/21 1550  SpO2 98 % 09/27/21 1550  Vitals shown include unvalidated device data.  Last Pain:  Vitals:   09/27/21 1545  TempSrc:   PainSc: 3          Complications: No notable events documented.

## 2021-09-27 NOTE — Anesthesia Procedure Notes (Signed)
Procedure Name: Intubation Date/Time: 09/27/2021 1:34 PM Performed by: Gwyndolyn Saxon, CRNA Pre-anesthesia Checklist: Patient identified, Emergency Drugs available, Suction available and Patient being monitored Patient Re-evaluated:Patient Re-evaluated prior to induction Oxygen Delivery Method: Circle system utilized Preoxygenation: Pre-oxygenation with 100% oxygen Induction Type: IV induction Ventilation: Mask ventilation without difficulty Laryngoscope Size: Miller and 2 Grade View: Grade I Tube type: Oral Tube size: 6.5 mm Number of attempts: 1 Airway Equipment and Method: Stylet Placement Confirmation: ETT inserted through vocal cords under direct vision, positive ETCO2 and breath sounds checked- equal and bilateral Secured at: 20 cm Tube secured with: Tape Dental Injury: Teeth and Oropharynx as per pre-operative assessment

## 2021-09-27 NOTE — Anesthesia Preprocedure Evaluation (Signed)
Anesthesia Evaluation  Patient identified by MRN, date of birth, ID band Patient awake    Reviewed: Allergy & Precautions, H&P , NPO status , Patient's Chart, lab work & pertinent test results, reviewed documented beta blocker date and time   Airway Mallampati: II  TM Distance: >3 FB Neck ROM: full    Dental no notable dental hx.    Pulmonary neg pulmonary ROS,    Pulmonary exam normal breath sounds clear to auscultation       Cardiovascular Exercise Tolerance: Good hypertension, Pt. on medications  Rhythm:regular Rate:Normal     Neuro/Psych  Headaches,  Neuromuscular disease negative psych ROS   GI/Hepatic negative GI ROS, Neg liver ROS,   Endo/Other  Hypothyroidism   Renal/GU Renal disease  negative genitourinary   Musculoskeletal   Abdominal   Peds  Hematology negative hematology ROS (+)   Anesthesia Other Findings   Reproductive/Obstetrics negative OB ROS                             Anesthesia Physical Anesthesia Plan  ASA: 3  Anesthesia Plan: General   Post-op Pain Management: Ofirmev IV (intra-op) and Dilaudid IV   Induction: Intravenous  PONV Risk Score and Plan: 3 and Ondansetron, Dexamethasone and Treatment may vary due to age or medical condition  Airway Management Planned: Oral ETT  Additional Equipment: None  Intra-op Plan:   Post-operative Plan: Extubation in OR  Informed Consent: I have reviewed the patients History and Physical, chart, labs and discussed the procedure including the risks, benefits and alternatives for the proposed anesthesia with the patient or authorized representative who has indicated his/her understanding and acceptance.     Dental Advisory Given  Plan Discussed with: CRNA and Anesthesiologist  Anesthesia Plan Comments: (  )        Anesthesia Quick Evaluation

## 2021-09-27 NOTE — Anesthesia Postprocedure Evaluation (Signed)
Anesthesia Post Note  Patient: Donna Andersen  Procedure(s) Performed: XI ROBOTIC ASSISTED LAPAROSCOPIC RADICAL NEPHRECTOMY (Left)     Patient location during evaluation: PACU Anesthesia Type: General Level of consciousness: awake and alert Pain management: pain level controlled Vital Signs Assessment: post-procedure vital signs reviewed and stable Respiratory status: spontaneous breathing, nonlabored ventilation, respiratory function stable and patient connected to nasal cannula oxygen Cardiovascular status: blood pressure returned to baseline and stable Postop Assessment: no apparent nausea or vomiting Anesthetic complications: no   No notable events documented.  Last Vitals:  Vitals:   09/27/21 1645 09/27/21 1700  BP: (!) 136/56 135/65  Pulse: 68 67  Resp: 11 12  Temp:    SpO2: 96% 98%    Last Pain:  Vitals:   09/27/21 1700  TempSrc:   PainSc: 2                  Mccrae Speciale

## 2021-09-27 NOTE — H&P (Addendum)
PRE-OP H&P  Visit Report     08/29/2021   --------------------------------------------------------------------------------   Donna Andersen  MRN: [id number]  DOB: 08-19-1941, 80 year old Female  PRIMARY CARE:  Sean A. Loanne Drilling, MD  REFERRING:  Glena Norfolk. Lovena Neighbours, MD  PROVIDER:  Bjorn Loser, M.D.  TREATING:  Ellison Hughs, M.D.  LOCATION:  [facility] [phone]     --------------------------------------------------------------------------------   CC/HPI: Left renal mass   Ms. [name] is a 80 year old female with a solid enhancing 7.2 cm left renal mass with features concerning for renal cell carcinoma. The mass was recently discovered on renal ultrasound from 08/11/2021 during an evaluation for recurrent UTIs and further characterized via MRI on 08/15/2021.   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, but no other concerning lesions  -Renal function is WNL     ALLERGIES: Adhesive tape Bee stings Latex lisinopril No Allergies Seafood shellfish - Anaphylaxis Sulfa    MEDICATIONS: Calcium  HCTZ  Olmesartan Medoxomil 40 mg tablet  Rosuvastatin Calcium 5 mg tablet  Vitamin D     GU PSH: Hysterectomy Unilat SO - 2009       [place] Notes: Colonoscopy (Fiberoptic), Thyroid Surgery, Hysterectomy   Skin Melanoma    NON-GU PSH: Diagnostic Colonoscopy - 2017     GU PMH: Acute Cystitis/UTI - 08/10/2021, Acute Cystitis, - 2014 Left renal neoplasm - 08/10/2021 Nocturia - 07/19/2021 Urinary Frequency - 07/19/2021 Urinary Tract Inf, Unspec site (Stable) - 07/19/2021, Pyuria, - 2015 Dysuria, Dysuria - 2017 Other microscopic hematuria, Microscopic hematuria - 2017 Personal Hx  Oth Urinary System diseases, History of chronic cystitis - 2014 Renal cyst, Renal cyst, acquired - 2014 Urge incontinence, Urge Incontinence Of Urine - 2014    NON-GU PMH: Encounter for general adult medical examination without abnormal findings, Encounter for preventive health examination - 2017 Asthma, Asthma - 2014 Hypercholesterolemia Hypertension Skin Cancer, History    FAMILY HISTORY: Acute Myocardial Infarction - Father Breast Cancer - Sister Death In The Family Father - Father Family Health Status Number - Runs In Family Heart Disease - Runs In Family Hypertension - Runs In Family Kidney Cancer - Sister Kidney Stones - Mother nephrolithiasis - Mother ovarian cancer - Mother Renal Disorder - Runs In Family thyroid nodule - Mother, Sister   SOCIAL HISTORY: Marital Status: Married Current Smoking Status: Patient has never smoked.   Tobacco Use Assessment Completed: Used Tobacco in last 30 days? Does not use smokeless tobacco. Drinks 1 drink per week.  Does not drink caffeine. 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     Notes: Never a smoker, Caffeine Use, Retired From Work, Marital History - Currently Married, Tobacco Use, Alcohol Use   REVIEW OF SYSTEMS:    GU Review Female:   Patient denies frequent urination, hard to postpone urination, burning /pain with urination, get up at night to urinate, leakage of urine, stream starts and stops, trouble starting your stream, have to strain to urinate, and being pregnant.  Gastrointestinal (Upper):   Patient denies nausea, vomiting, and indigestion/ heartburn.  Gastrointestinal (Lower):   Patient denies diarrhea and constipation.  Constitutional:   Patient denies fever, night sweats, weight loss, and fatigue.  Skin:   Patient denies itching and skin rash/ lesion.  Eyes:   Patient denies blurred vision and double vision.  Ears/ Nose/ Throat:   Patient denies sore throat and sinus problems.  Hematologic/Lymphatic:   Patient denies  swollen glands and easy bruising.  Cardiovascular:   Patient denies leg swelling and chest pains.  Respiratory:   Patient denies cough and shortness of breath.  Endocrine:   Patient denies excessive thirst.  Musculoskeletal:   Patient denies back pain and joint pain.  Neurological:   Patient denies headaches and dizziness.  Psychologic:   Patient denies depression and anxiety.   PE PSYCH: NORMAL MOOD AND AFFECT.  A&Ox3 RESP:  RRR, CTAB ABD:  SNTND, NORMAL BS   VITAL SIGNS: None   Complexity of Data:  Lab Test Review:   BMP  Records Review:   Previous Patient Records  X-Ray Review: Chest X-Ray Outside: Reviewed Films. Reviewed Report. Discussed With Patient.  MRI Abdomen: Reviewed Films. Reviewed Report. Discussed With Patient.     08/10/21  General Chemistry  Sodium 137 mEq/L  Potassium 4.3 mEq/L  BUN 15 mg/dL  Creatinine 0.5 mg/dL  Chloride 100 mEq/L  CO2 30 mEq/L  Glucose 100 mg/dL  Calcium 9.2 mg/dL  eGFR African American 107.4   eGFR Non-Afr. American 92.7   Notes:                     CLINICAL DATA: Left renal mass.     EXAM:  MRI ABDOMEN WITHOUT AND WITH CONTRAST     TECHNIQUE:  Multiplanar multisequence MR imaging of the abdomen was performed  both before and after the administration of intravenous contrast.     CONTRAST: 43mL GADAVIST GADOBUTROL 1 MMOL/ML IV SOLN     COMPARISON: Ultrasound exam 08/10/2021.     FINDINGS:  Lower chest: Unremarkable.     Hepatobiliary: No suspicious focal abnormality within the liver  parenchyma. There is no evidence for gallstones, gallbladder wall  thickening, or pericholecystic fluid. No intrahepatic or  extrahepatic biliary dilation.     Pancreas: Ductal anatomy is consistent with pancreas divisum. No  focal or suspicious mass lesion. No main duct dilatation.     Spleen: No splenomegaly. No focal mass lesion.     Adrenals/Urinary Tract: No adrenal nodule or mass.     Multiple tiny T2 hyperintensities in the right  kidney are too small  to characterize but likely benign. 1.5 cm T1 hypointense, T2  hyperintense lesion identified anteriorly in the lower interpolar  right kidney (axial T2 image 16/9). No definite enhancement after IV  contrast administration although the rim of this lesion appears  minimally irregular, potentially related to motion.     7.1 x 7.2 x 6.1 cm heterogeneous exophytic mass noted upper pole  right kidney. Lesion shows heterogeneous enhancement after IV  contrast administration. MR imaging features compatible with renal  cell carcinoma, likely clear tight. Lesion extends posteriorly into  the perinephric fat and there is evidence of extension into the  posterior aspect of the central sinus. No evidence for filling  defect in the left renal vein or left renal pelvis.     2.3 cm exophytic lesion noted posteriorly in the interpolar left  kidney. While characterization of this lesion is somewhat limited by  motion degradation, T2 imaging shows an imperceptible wall with  homogeneous high signal intensity. Pre contrast T1 imaging shows  diffuse increased signal intensity. No definite enhancement after IV  contrast administration with assessment somewhat limited by the  motion degradation.     Tiny  9 mm subcapsular lesion noted extreme lower pole left kidney  demonstrating high signal intensity on precontrast T1 imaging (axial  image 71 of series 12 and precontrast coronal T1 image 34 of series  10). This lesion is not perceptible on later post-contrast or  subtraction imaging due to motion degradation. No high signal  intensity in this region on T2 imaging. Features are felt to be most  likely related to a hemorrhagic or proteinaceous cyst.     Stomach/Bowel: Stomach is unremarkable. No gastric wall thickening.  No evidence of outlet obstruction. Duodenum is normally positioned  as is the ligament of Treitz. Duodenum diverticula evident. No small  bowel or colonic dilatation  within the visualized abdomen.  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     Vascular/Lymphatic: No abdominal aortic aneurysm. No abdominal  lymphadenopathy.     Other: No intraperitoneal free fluid.     Musculoskeletal: No focal suspicious marrow enhancement within the  visualized bony anatomy.     IMPRESSION:  1. 7.1 x 7.2 x 6.1 cm heterogeneous exophytic mass upper pole LEFT  kidney shows heterogeneous enhancement after IV contrast  administration and is most consistent with renal cell carcinoma,  likely clear cell.  2. 2.3 cm exophytic lesion posteriorly in the interpolar left kidney  shows no definite enhancement after IV contrast administration and  is most likely a hemorrhagic or proteinaceous cyst (Bosniak II).  3. 9 mm subcapsular lesion extreme lower pole left kidney with high  signal intensity on precontrast T1 imaging and not perceptible on  postcontrast imaging secondary to motion degradation. Imaging  features are felt to be most likely related to a hemorrhagic or  proteinaceous cyst (Bosniak II) but attention on follow-up  recommended.  4. 1.5 cm cyst lower pole right kidney shows no definite enhancement  after IV contrast administration and while characterization of this  lesion is somewhat limited by motion degradation, is likely Bosniak  I cyst.  5. Pancreas divisum.        Electronically Signed  By: Misty Stanley M.D.  On: 08/15/2021 12:47   PROCEDURES:          Urinalysis w/Scope Dipstick Dipstick Cont'd Micro  Color: Yellow Bilirubin: Neg mg/dL WBC/hpf: NS (Not Seen)  Appearance: Clear Ketones: Neg mg/dL RBC/hpf: 0 - 2/hpf  Specific Gravity: 1.015 Blood: 2+ ery/uL Bacteria: Rare (0-9/hpf)  pH: 6.0 Protein: Neg mg/dL Cystals: NS (Not Seen)  Glucose: Neg mg/dL Urobilinogen: 0.2 mg/dL Casts: NS (Not Seen)    Nitrites: Neg Trichomonas: Not Present    Leukocyte Esterase: Neg leu/uL Mucous: Not Present      Epithelial Cells: NS (Not Seen)      Yeast:  NS (Not Seen)      Sperm: Not Present    Notes: Microscopic performed on unspun sample due to QNS.    ASSESSMENT:      ICD-10 Details  1 GU:   Left renal neoplasm - D49.512 Chronic, Stable     PLAN:           Schedule Return Visit/Planned Activity: ASAP - Schedule Surgery          Document Letter(s):  Created for Patient: Clinical Summary   Created for Sean A. Loanne Drilling, MD         Notes:   -I reviewed imaging results and films with the patient personally. We discussed that the mass in question has features concerning for malignancy. I explained the natural history of presumed renal cell carcinoma. I reviewed  the AUA guidelines for evaluation and treatment of renal masses. The options of active surveillance, in situ tumor ablation, partial and radical nephrectomy was discussed. The risks of robotic laparoscopic LEFT radical nephrectomy were discussed in detail including but not limited to: negative pathology, open conversion, infection of the skin/abdominal cavity, VTE, MI/CVA, lymphatic leak, injury to adjacent solid/hollow viscus organs, bleeding requiring a blood transfusion, catastrophic bleeding, hernia formation and other imponderables. The patient voices understanding and wishes to proceed.    Signed by Ellison Hughs, M.D. on 08/31/21 at 8:23 AM (EDT)

## 2021-09-28 ENCOUNTER — Encounter (HOSPITAL_COMMUNITY): Payer: Self-pay | Admitting: Urology

## 2021-09-28 LAB — BASIC METABOLIC PANEL
Anion gap: 9 (ref 5–15)
BUN: 14 mg/dL (ref 8–23)
CO2: 25 mmol/L (ref 22–32)
Calcium: 8.9 mg/dL (ref 8.9–10.3)
Chloride: 99 mmol/L (ref 98–111)
Creatinine, Ser: 1.3 mg/dL — ABNORMAL HIGH (ref 0.44–1.00)
GFR, Estimated: 42 mL/min — ABNORMAL LOW (ref >60)
Glucose, Bld: 144 mg/dL — ABNORMAL HIGH (ref 70–99)
Potassium: 4.3 mmol/L (ref 3.5–5.1)
Sodium: 133 mmol/L — ABNORMAL LOW (ref 135–145)

## 2021-09-28 LAB — CBC
HCT: 35.5 % — ABNORMAL LOW (ref 36.0–46.0)
Hemoglobin: 12 g/dL (ref 12.0–15.0)
MCH: 31.1 pg (ref 26.0–34.0)
MCHC: 33.8 g/dL (ref 30.0–36.0)
MCV: 92 fL (ref 80.0–100.0)
Platelets: 279 10*3/uL (ref 150–400)
RBC: 3.86 MIL/uL — ABNORMAL LOW (ref 3.87–5.11)
RDW: 12.7 % (ref 11.5–15.5)
WBC: 11.1 10*3/uL — ABNORMAL HIGH (ref 4.0–10.5)
nRBC: 0 % (ref 0.0–0.2)

## 2021-09-28 MED ORDER — DIPHENHYDRAMINE HCL 50 MG/ML IJ SOLN
12.5000 mg | Freq: Once | INTRAMUSCULAR | Status: AC
  Administered 2021-09-28: 12.5 mg via INTRAVENOUS
  Filled 2021-09-28: qty 1

## 2021-09-28 NOTE — Discharge Summary (Signed)
Date of admission: 09/27/2021  Date of discharge: 09/29/21  Admission diagnosis: left renal mass  Discharge diagnosis: left renal mass  Secondary diagnoses:  Patient Active Problem List   Diagnosis Date Noted   Left renal mass 09/27/2021   Renal cell carcinoma, right ([place]) 09/07/2021   Tubular adenoma of colon 09/07/2021   Environmental allergies 06/27/2018   Fibrocystic breast changes, bilateral 10/10/2009   ACE inhibitor intolerance 06/20/2009   White coat syndrome with diagnosis of hypertension 11/22/2008   Mixed hyperlipidemia 07/21/2007   Osteopenia 07/21/2007    Procedures performed: Procedure(s): XI ROBOTIC ASSISTED LAPAROSCOPIC RADICAL NEPHRECTOMY  History and Physical: For full details, please see admission history and physical. Briefly, Donna Andersen is a 80 y.o. year old patient with left renal mass.Marland Kitchen   Hospital Course: Patient tolerated the procedure well.  She was then transferred to the floor after an uneventful PACU stay.  Her hospital course was uncomplicated.  On POD#2 she had met discharge criteria: was eating a regular diet, was up and ambulating independently,  pain was well controlled, was voiding without a catheter, and was ready to for discharge.   Laboratory values:  Recent Labs    09/28/21 0457  WBC 11.1*  HGB 12.0  HCT 35.5*   Recent Labs    09/28/21 0457  NA 133*  K 4.3  CL 99  CO2 25  GLUCOSE 144*  BUN 14  CREATININE 1.30*  CALCIUM 8.9   No results for input(s): LABPT, INR in the last 72 hours. No results for input(s): LABURIN in the last 72 hours. Results for orders placed or performed in visit on 09/25/21  SARS Coronavirus 2 (TAT 6-24 hrs)     Status: None   Collection Time: 09/25/21 12:00 AM  Result Value Ref Range Status   SARS Coronavirus 2 RESULT: NEGATIVE  Final    Comment: RESULT: NEGATIVESARS-CoV-2 INTERPRETATION:A NEGATIVE  test result means that SARS-CoV-2 RNA was not present in the specimen above the limit of detection  of this test. This does not preclude a possible SARS-CoV-2 infection and should not be used as the  sole basis for patient management decisions. Negative results must be combined with clinical observations, patient history, and epidemiological information. Optimum specimen types and timing for peak viral levels during infections caused by SARS-CoV-2  have not been determined. Collection of multiple specimens or types of specimens may be necessary to detect virus. Improper specimen collection and handling, sequence variability under primers/probes, or organism present below the limit of detection may  lead to false negative results. Positive and negative predictive values of testing are highly dependent on prevalence. False negative test results are more likely when prevalence of disease is high.The expected result is NEGATIVE.Fact S heet for  Healthcare Providers: LocalChronicle.no Sheet for Patients: SalonLookup.es Reference Range - Negative     Disposition: Home  Discharge instruction: The patient was instructed to be ambulatory but told to refrain from heavy lifting, strenuous activity, or driving.   Discharge medications:  Allergies as of 09/28/2021       Reactions   Bee Venom Hives   Atorvastatin    REACTION: dental pain, myalgias, and headache   Lisinopril    REACTION: cough   Losartan Potassium    REACTION: drowsiness   Sulfonamide Derivatives    REACTION: Rash, Hives, Asthma   Latex Itching, Rash    scars   Other Rash, Hives   shrimp. shrimp - Anaphylaxis (swelling & hives) Tape. Tape - scars, itching from latex  tape        Medication List     TAKE these medications    calcium carbonate 1250 (500 Ca) MG tablet Commonly known as: OS-CAL - dosed in mg of elemental calcium Take 1 tablet by mouth every 14 (fourteen) days.   docusate sodium 100 MG capsule Commonly known as: Colace Take 1 capsule (100 mg  total) by mouth 2 (two) times daily as needed for mild constipation.   EPINEPHrine 0.3 mg/0.3 mL Soaj injection Commonly known as: EPI-PEN Inject 0.3 mg into the muscle as needed for anaphylaxis.   hydrochlorothiazide 12.5 MG capsule Commonly known as: MICROZIDE TAKE 1 CAPSULE BY MOUTH EVERY DAY   HYDROcodone-acetaminophen 5-325 MG tablet Commonly known as: Norco Take 1 tablet by mouth every 4 (four) hours as needed for moderate pain.   olmesartan 40 MG tablet Commonly known as: BENICAR TAKE 1 TABLET BY MOUTH EVERY DAY What changed: when to take this   ondansetron 4 MG tablet Commonly known as: Zofran Take 1 tablet (4 mg total) by mouth daily as needed for nausea or vomiting.   rosuvastatin 5 MG tablet Commonly known as: CRESTOR TAKE $RemoveBefo'5MG'KSRMZacHCrE$  BY MOUTH EVERY OTHER DAY   Symbicort 80-4.5 MCG/ACT inhaler Generic drug: budesonide-formoterol Inhale 2 puffs into the lungs daily as needed (allergies).   Vitamin D3 2400 UNIT/ML Liqd Take 2,400 Units by mouth daily.        Followup:   Follow-up Information     Ceasar Mons, MD Follow up on 10/13/2021.   Specialty: Urology Why: Postop appointment at 11:15 AM Contact information: [address] [place] [address] [phone]

## 2021-09-28 NOTE — Plan of Care (Signed)
  Problem: Education: Goal: Knowledge of General Education information will improve Description: Including pain rating scale, medication(s)/side effects and non-pharmacologic comfort measures Outcome: Completed/Met   Problem: Health Behavior/Discharge Planning: Goal: Ability to manage health-related needs will improve Outcome: Progressing   Problem: Clinical Measurements: Goal: Ability to maintain clinical measurements within normal limits will improve Outcome: Completed/Met Goal: Will remain free from infection Outcome: Completed/Met Goal: Diagnostic test results will improve Outcome: Completed/Met Goal: Respiratory complications will improve Outcome: Completed/Met Goal: Cardiovascular complication will be avoided Outcome: Completed/Met   Problem: Activity: Goal: Risk for activity intolerance will decrease Outcome: Progressing   Problem: Nutrition: Goal: Adequate nutrition will be maintained Outcome: Progressing   Problem: Coping: Goal: Level of anxiety will decrease Outcome: Progressing   Problem: Elimination: Goal: Will not experience complications related to bowel motility Outcome: Progressing Goal: Will not experience complications related to urinary retention Outcome: Progressing   Problem: Pain Managment: Goal: General experience of comfort will improve Outcome: Progressing   Problem: Safety: Goal: Ability to remain free from injury will improve Outcome: Progressing   Problem: Skin Integrity: Goal: Risk for impaired skin integrity will decrease Outcome: Progressing   Problem: Education: Goal: Knowledge of the prescribed therapeutic regimen will improve Outcome: Progressing   Problem: Bowel/Gastric: Goal: Gastrointestinal status for postoperative course will improve Outcome: Progressing   Problem: Clinical Measurements: Goal: Postoperative complications will be avoided or minimized Outcome: Progressing   Problem: Respiratory: Goal: Ability to  achieve and maintain a regular respiratory rate will improve Outcome: Completed/Met   Problem: Skin Integrity: Goal: Demonstration of wound healing without infection will improve Outcome: Progressing   Problem: Urinary Elimination: Goal: Ability to avoid or minimize complications of infection will improve Outcome: Progressing Goal: Ability to achieve and maintain urine output will improve Outcome: Progressing

## 2021-09-28 NOTE — Discharge Instructions (Signed)

## 2021-09-28 NOTE — Progress Notes (Signed)
   09/28/21 1459  Provider Notification  Provider Name/Title Dr. Claudia Desanctis  Date Provider Notified 09/28/21  Time Provider Notified 1457  Notification Type Page  Notification Reason Change in status (rash to cheeks/forehead)  Provider response See new orders  Date of Provider Response 09/28/21  Time of Provider Response 2774  Pt reports high sensitivity to anything that comes in contact with her skin after cheeks and forehead turned red with AM bath. Reports no itching but burning sensation. Dr. Claudia Desanctis made aware and new orders received for IV Benadryl x 1.

## 2021-09-28 NOTE — Progress Notes (Signed)
Urology Inpatient Progress Report    Intv/Subj: No acute events overnight.  +flatus and ambulation. Tolerating regular diet.  Principal Problem:   Left renal mass  Current Facility-Administered Medications  Medication Dose Route Frequency Provider Last Rate Last Admin   0.45 % sodium chloride infusion   Intravenous Continuous Aldine Contes, MD 100 mL/hr at 09/28/21 0353 New Bag at 09/28/21 0353   acetaminophen (OFIRMEV) IV 1,000 mg  1,000 mg Intravenous Q6H Aldine Contes, MD 400 mL/hr at 09/28/21 0511 1,000 mg at 09/28/21 0511   docusate sodium (COLACE) capsule 100 mg  100 mg Oral BID Aldine Contes, MD   100 mg at 09/27/21 2126   hydrochlorothiazide (MICROZIDE) capsule 12.5 mg  12.5 mg Oral Daily Aldine Contes, MD       morphine 2 MG/ML injection 2-4 mg  2-4 mg Intravenous Q2H PRN Aldine Contes, MD   2 mg at 09/27/21 1916   ondansetron (ZOFRAN) injection 4 mg  4 mg Intravenous Q4H PRN Aldine Contes, MD       oxyCODONE (Oxy IR/ROXICODONE) immediate release tablet 5 mg  5 mg Oral Q4H PRN Aldine Contes, MD       rosuvastatin (CRESTOR) tablet 5 mg  5 mg Oral Daily Aldine Contes, MD       senna (SENOKOT) tablet 8.6 mg  1 tablet Oral BID Aldine Contes, MD   8.6 mg at 09/27/21 2126     Objective: Vital: Vitals:   09/27/21 2206 09/28/21 0227 09/28/21 0513 09/28/21 0937  BP: (!) 159/74 (!) 140/59 (!) 136/56 (!) 132/57  Pulse: 76 77 71 72  Resp: 16 16 16 12   Temp: 98.4 F (36.9 C) 98.7 F (37.1 C) 98.8 F (37.1 C) 98 F (36.7 C)  TempSrc: Oral Oral Oral Oral  SpO2: 96% 96% 97% 99%  Weight:      Height:       I/Os: I/O last 3 completed shifts: In: 3005.1 [P.O.:740; I.V.:1965.1; IV Piggyback:300] Out: 1500 [Urine:1450; Blood:50]  Physical Exam:  General: Patient is in no apparent distress Lungs: Normal respiratory effort, chest expands symmetrically. GI: Incisions are c/d/i. The abdomen is soft and appropriately tender to palpation Foley: removed earlier this AM  Ext: lower extremities  symmetric  Lab Results: Recent Labs    09/28/21 0457  WBC 11.1*  HGB 12.0  HCT 35.5*   Recent Labs    09/28/21 0457  NA 133*  K 4.3  CL 99  CO2 25  GLUCOSE 144*  BUN 14  CREATININE 1.30*  CALCIUM 8.9   No results for input(s): LABPT, INR in the last 72 hours. No results for input(s): LABURIN in the last 72 hours. Results for orders placed or performed in visit on 09/25/21  SARS Coronavirus 2 (TAT 6-24 hrs)     Status: None   Collection Time: 09/25/21 12:00 AM  Result Value Ref Range Status   SARS Coronavirus 2 RESULT: NEGATIVE  Final    Comment: RESULT: NEGATIVESARS-CoV-2 INTERPRETATION:A NEGATIVE  test result means that SARS-CoV-2 RNA was not present in the specimen above the limit of detection of this test. This does not preclude a possible SARS-CoV-2 infection and should not be used as the  sole basis for patient management decisions. Negative results must be combined with clinical observations, patient history, and epidemiological information. Optimum specimen types and timing for peak viral levels during infections caused by SARS-CoV-2  have not been determined. Collection of multiple specimens or types of specimens may be necessary to detect virus. Improper specimen  collection and handling, sequence variability under primers/probes, or organism present below the limit of detection may  lead to false negative results. Positive and negative predictive values of testing are highly dependent on prevalence. False negative test results are more likely when prevalence of disease is high.The expected result is NEGATIVE.Fact S heet for  Healthcare Providers: LocalChronicle.no Sheet for Patients: SalonLookup.es Reference Range - Negative     Studies/Results: No results found.  Assessment/Plan: Left renal mass POD#1 s/p left lap nephrectomy -continue regular diet -pain meds -d/c foley -ambulated -IS -DVTs at all  times while in bed -pt too anxious to go home today with ambulating, will check back later but anticipate dc tomorrow    Jacalyn Lefevre, MD Urology 09/28/2021, 9:57 AM

## 2021-09-29 DIAGNOSIS — D49512 Neoplasm of unspecified behavior of left kidney: Secondary | ICD-10-CM | POA: Diagnosis not present

## 2021-09-29 LAB — BASIC METABOLIC PANEL
Anion gap: 7 (ref 5–15)
BUN: 20 mg/dL (ref 8–23)
CO2: 25 mmol/L (ref 22–32)
Calcium: 8.8 mg/dL — ABNORMAL LOW (ref 8.9–10.3)
Chloride: 104 mmol/L (ref 98–111)
Creatinine, Ser: 1.27 mg/dL — ABNORMAL HIGH (ref 0.44–1.00)
GFR, Estimated: 43 mL/min — ABNORMAL LOW (ref >60)
Glucose, Bld: 102 mg/dL — ABNORMAL HIGH (ref 70–99)
Potassium: 3.9 mmol/L (ref 3.5–5.1)
Sodium: 136 mmol/L (ref 135–145)

## 2021-09-29 NOTE — Progress Notes (Incomplete)
D/C instruction given. Discussed importance of ensuring to not lift to rub incisions. Py verbalized understanding. Call provider if signs of infection or bleeding pt and husband verbalized understanding of

## 2021-10-02 ENCOUNTER — Telehealth: Payer: Self-pay

## 2021-10-02 LAB — SURGICAL PATHOLOGY

## 2021-10-02 NOTE — Telephone Encounter (Signed)
Transition Care Management Unsuccessful Follow-up Telephone Call  Date of discharge and from where:  09/29/21  Attempts:  1st Attempt  Reason for unsuccessful TCM follow-up call:  Left voice message

## 2021-10-13 DIAGNOSIS — C642 Malignant neoplasm of left kidney, except renal pelvis: Secondary | ICD-10-CM | POA: Diagnosis not present

## 2021-10-13 LAB — BASIC METABOLIC PANEL
BUN: 27 — AB (ref 4–21)
CO2: 26 — AB (ref 13–22)
Chloride: 92 — AB (ref 99–108)
Creatinine: 1 (ref 0.5–1.1)
Glucose: 97
Potassium: 4.7 (ref 3.4–5.3)
Sodium: 130 — AB (ref 137–147)

## 2021-10-13 LAB — HEPATIC FUNCTION PANEL
ALT: 20 (ref 7–35)
AST: 21 (ref 13–35)

## 2021-10-14 LAB — COMPREHENSIVE METABOLIC PANEL
Albumin: 4.5 (ref 3.5–5.0)
Calcium: 10.4 (ref 8.7–10.7)
GFR calc non Af Amer: 62.5

## 2021-10-16 ENCOUNTER — Telehealth: Payer: Self-pay

## 2021-10-16 NOTE — Telephone Encounter (Signed)
Patient is scheduled for Wed 12/14.  Patient states her urologist would like her to stop hydrochlorothiazide due to sodium level being low.  If patient does not need appt, please advise.

## 2021-10-16 NOTE — Telephone Encounter (Signed)
Please advise 

## 2021-10-16 NOTE — Telephone Encounter (Signed)
Keep patients appointment

## 2021-10-17 ENCOUNTER — Telehealth: Payer: Self-pay | Admitting: Oncology

## 2021-10-17 NOTE — Telephone Encounter (Signed)
Scheduled appt per 12/13 referral. Pt is aware of appt date and time. Pt requested to sch appt for Mid January.

## 2021-10-18 ENCOUNTER — Encounter: Payer: Self-pay | Admitting: Family Medicine

## 2021-10-18 ENCOUNTER — Ambulatory Visit (INDEPENDENT_AMBULATORY_CARE_PROVIDER_SITE_OTHER): Payer: Medicare HMO | Admitting: Family Medicine

## 2021-10-18 ENCOUNTER — Other Ambulatory Visit: Payer: Self-pay

## 2021-10-18 VITALS — BP 136/88 | HR 74 | Temp 98.3°F | Ht 60.0 in | Wt 137.0 lb

## 2021-10-18 DIAGNOSIS — I10 Essential (primary) hypertension: Secondary | ICD-10-CM | POA: Diagnosis not present

## 2021-10-18 DIAGNOSIS — C642 Malignant neoplasm of left kidney, except renal pelvis: Secondary | ICD-10-CM

## 2021-10-18 DIAGNOSIS — E871 Hypo-osmolality and hyponatremia: Secondary | ICD-10-CM | POA: Diagnosis not present

## 2021-10-18 NOTE — Patient Instructions (Signed)
Please return in 6 weeks for recheck.   Keep an eye on your blood pressures.   If you have any questions or concerns, please don't hesitate to send me a message via MyChart or call the office at [phone]. Thank you for visiting with [place] today! It's our pleasure caring for you.

## 2021-10-18 NOTE — Progress Notes (Signed)
Subjective  CC:  Chief Complaint  Patient presents with   Medication Problem    Had kidney removed 09/27/2021, Urologist would like patient to stop HCTZ.    HPI: Donna Andersen is a 80 y.o. female who presents to the office today to address the problems listed above in the chief complaint. 80 year old here for follow-up after having left radical nephrectomy with new diagnosis of clear-cell carcinoma.  Follow-up labs revealed hyponatremia with a sodium of 130.  This was done in December 9.  She feels well.  Recovering well from her surgery.  She is on HCTZ 12.5 mg daily with Benicar for hypertension.  She did not take HCTZ today per recommendation from neurology.  She has scheduled follow-up for repeat lab work in 1 week.  No mental status changes.  She reports normal hydration and appetite. Hypertension f/u: Control is good . Pt reports she is doing well.  Home readings are normal.  Lab Results  Component Value Date   CREATININE 1.0 10/13/2021   BUN 27 (A) 10/13/2021   NA 130 (A) 10/13/2021   K 4.7 10/13/2021   CL 92 (A) 10/13/2021   CO2 26 (A) 10/13/2021    Assessment  1. White coat syndrome with diagnosis of hypertension   2. Hyponatremia   3. Clear cell renal cell carcinoma, left (HCC)      Plan  Renal cell carcinoma status post left nephrectomy: Recovering well.  Has follow-up with oncology to discuss immunotherapy. Hypertension f/u: BP control is well controlled.  We will hold HCTZ and recheck sodium level today along with urine and serum osmolality.  We will follow-up again next week.  Further work-up and evaluation done at that time if needed.  If blood pressure elevates, will add a different agent.  Patient understands and agrees with care plan.  Education regarding management of these chronic disease states was given. Management strategies discussed on successive visits include dietary and exercise recommendations, goals of achieving and maintaining IBW, and lifestyle  modifications aiming for adequate sleep and minimizing stressors.   Follow up: 6 weeks for recheck  Orders Placed This Encounter  Procedures   Basic metabolic panel   Osmolality, urine   Osmolality   No orders of the defined types were placed in this encounter.     BP Readings from Last 3 Encounters:  10/18/21 136/88  09/29/21 (!) 136/52  09/12/21 (!) 164/73   Wt Readings from Last 3 Encounters:  10/18/21 137 lb (62.1 kg)  09/27/21 138 lb (62.6 kg)  09/12/21 140 lb (63.5 kg)    Lab Results  Component Value Date   CHOL 214 (H) 03/27/2021   CHOL 216 (H) 04/08/2020   CHOL 215 (H) 04/03/2019   Lab Results  Component Value Date   HDL 58.50 03/27/2021   HDL 59.70 04/08/2020   HDL 55.60 04/03/2019   Lab Results  Component Value Date   LDLCALC 126 (H) 03/27/2021   LDLCALC 128 (H) 04/08/2020   LDLCALC 126 (H) 04/03/2019   Lab Results  Component Value Date   TRIG 149.0 03/27/2021   TRIG 138.0 04/08/2020   TRIG 167.0 (H) 04/03/2019   Lab Results  Component Value Date   CHOLHDL 4 03/27/2021   CHOLHDL 4 04/08/2020   CHOLHDL 4 04/03/2019   Lab Results  Component Value Date   LDLDIRECT 109.0 02/16/2015   LDLDIRECT 145.4 01/26/2013   LDLDIRECT 130.4 01/25/2012   Lab Results  Component Value Date   CREATININE 1.0 10/13/2021  BUN 27 (A) 10/13/2021   NA 130 (A) 10/13/2021   K 4.7 10/13/2021   CL 92 (A) 10/13/2021   CO2 26 (A) 10/13/2021    The 10-year ASCVD risk score (Arnett DK, et al., 2019) is: 31.5%   Values used to calculate the score:     Age: 69 years     Sex: Female     Is Non-Hispanic African American: No     Diabetic: No     Tobacco smoker: No     Systolic Blood Pressure: 175 mmHg     Is BP treated: Yes     HDL Cholesterol: 58.5 mg/dL     Total Cholesterol: 214 mg/dL  I reviewed the patients updated PMH, FH, and SocHx.    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     Priority: High   Tubular adenoma of colon 09/07/2021    Priority: High   White coat syndrome with diagnosis of hypertension 11/22/2008    Priority: High   Mixed hyperlipidemia 07/21/2007    Priority: High   Left renal mass 09/27/2021    Priority: Medium    Osteopenia 07/21/2007    Priority: Medium    Environmental allergies 06/27/2018    Priority: Low   Fibrocystic breast changes, bilateral 10/10/2009    Priority: Low   ACE inhibitor intolerance 06/20/2009    Priority: Low    Allergies: Bee venom, Atorvastatin, Lisinopril, Losartan potassium, Sulfonamide derivatives, Latex, and Other  Social History: Patient  reports that she has never smoked. She has never used smokeless tobacco. She reports current alcohol use. She reports that she does not use drugs.  Current Meds  Medication Sig   calcium carbonate (OS-CAL - DOSED IN MG OF ELEMENTAL CALCIUM) 1250 MG tablet Take 1 tablet by mouth every 14 (fourteen) days.   Cholecalciferol (VITAMIN D3) 2400 UNIT/ML LIQD Take 2,400 Units by mouth daily.   docusate sodium (COLACE) 100 MG capsule Take 1 capsule (100 mg total) by mouth 2 (two) times daily as needed for mild constipation.   EPINEPHrine 0.3 mg/0.3 mL IJ SOAJ injection Inject 0.3 mg into the muscle as needed for anaphylaxis.   olmesartan (BENICAR) 40 MG tablet TAKE 1 TABLET BY MOUTH EVERY DAY (Patient taking differently: Take 40 mg by mouth every evening.)   rosuvastatin (CRESTOR) 5 MG tablet TAKE 5MG  BY MOUTH EVERY OTHER DAY   SYMBICORT 80-4.5 MCG/ACT inhaler Inhale 2 puffs into the lungs daily as needed (allergies).   [DISCONTINUED] hydrochlorothiazide (MICROZIDE) 12.5 MG capsule TAKE 1 CAPSULE BY MOUTH EVERY DAY    Review of Systems: Cardiovascular: negative for chest pain, palpitations, leg swelling, orthopnea Respiratory: negative for SOB, wheezing or persistent cough Gastrointestinal: negative for abdominal pain Genitourinary: negative for dysuria or gross  hematuria  Objective  Vitals: BP 136/88    Pulse 74    Temp 98.3 F (36.8 C) (Temporal)    Ht 5' (1.524 m)    Wt 137 lb (62.1 kg)    LMP  (LMP Unknown)    SpO2 97%    BMI 26.76 kg/m  General: no acute distress  Abd: soft. Wellhealing left sided surgical scars.  No edema Commons side effects, risks, benefits, and alternatives for medications and treatment plan prescribed today were discussed, and the patient expressed understanding of the given instructions. Patient is instructed to call or message via MyChart if he/she has any questions or concerns regarding our treatment plan.  No barriers to understanding were identified. We discussed Red Flag symptoms and signs in detail. Patient expressed understanding regarding what to do in case of urgent or emergency type symptoms.  Medication list was reconciled, printed and provided to the patient in AVS. Patient instructions and summary information was reviewed with the patient as documented in the AVS. This note was prepared with assistance of Dragon voice recognition software. Occasional wrong-word or sound-a-like substitutions may have occurred due to the inherent limitations of voice recognition software  This visit occurred during the SARS-CoV-2 public health emergency.  Safety protocols were in place, including screening questions prior to the visit, additional usage of staff PPE, and extensive cleaning of exam room while observing appropriate contact time as indicated for disinfecting solutions.

## 2021-10-19 ENCOUNTER — Encounter: Payer: Self-pay | Admitting: Family Medicine

## 2021-10-19 LAB — BASIC METABOLIC PANEL
BUN: 29 mg/dL — ABNORMAL HIGH (ref 6–23)
CO2: 24 mEq/L (ref 19–32)
Calcium: 9.9 mg/dL (ref 8.4–10.5)
Chloride: 97 mEq/L (ref 96–112)
Creatinine, Ser: 1.31 mg/dL — ABNORMAL HIGH (ref 0.40–1.20)
GFR: 39.01 mL/min — ABNORMAL LOW (ref >60.00)
Glucose, Bld: 88 mg/dL (ref 70–99)
Potassium: 5.3 mEq/L — ABNORMAL HIGH (ref 3.5–5.1)
Sodium: 134 mEq/L — ABNORMAL LOW (ref 135–145)

## 2021-10-20 ENCOUNTER — Other Ambulatory Visit: Payer: Medicare HMO

## 2021-10-20 DIAGNOSIS — E871 Hypo-osmolality and hyponatremia: Secondary | ICD-10-CM

## 2021-10-20 LAB — OSMOLALITY

## 2021-10-20 LAB — OSMOLALITY, URINE

## 2021-10-20 LAB — EXTRA URINE SPECIMEN

## 2021-10-24 DIAGNOSIS — C642 Malignant neoplasm of left kidney, except renal pelvis: Secondary | ICD-10-CM | POA: Diagnosis not present

## 2021-10-24 LAB — BASIC METABOLIC PANEL
BUN: 22 — AB (ref 4–21)
CO2: 28 — AB (ref 13–22)
Chloride: 98 — AB (ref 99–108)
Creatinine: 0.9 (ref 0.5–1.1)
Glucose: 98
Potassium: 4.4 (ref 3.4–5.3)
Sodium: 134 — AB (ref 137–147)

## 2021-10-24 LAB — COMPREHENSIVE METABOLIC PANEL
Calcium: 10.1 (ref 8.7–10.7)
GFR calc Af Amer: 71
GFR calc non Af Amer: 61.2

## 2021-10-25 DIAGNOSIS — D485 Neoplasm of uncertain behavior of skin: Secondary | ICD-10-CM | POA: Diagnosis not present

## 2021-10-25 DIAGNOSIS — Z961 Presence of intraocular lens: Secondary | ICD-10-CM | POA: Diagnosis not present

## 2021-10-25 DIAGNOSIS — H52203 Unspecified astigmatism, bilateral: Secondary | ICD-10-CM | POA: Diagnosis not present

## 2021-10-25 DIAGNOSIS — D3131 Benign neoplasm of right choroid: Secondary | ICD-10-CM | POA: Diagnosis not present

## 2021-10-25 LAB — OSMOLALITY, URINE

## 2021-10-25 LAB — OSMOLALITY: Osmolality: 290 mOsm/kg (ref 278–305)

## 2021-10-26 ENCOUNTER — Other Ambulatory Visit: Payer: Medicare HMO

## 2021-10-27 ENCOUNTER — Encounter: Payer: Self-pay | Admitting: Family Medicine

## 2021-10-31 ENCOUNTER — Encounter: Payer: Self-pay | Admitting: Family Medicine

## 2021-11-17 DIAGNOSIS — C642 Malignant neoplasm of left kidney, except renal pelvis: Secondary | ICD-10-CM | POA: Diagnosis not present

## 2021-11-17 LAB — BASIC METABOLIC PANEL
BUN: 24 — AB (ref 4–21)
CO2: 25 — AB (ref 13–22)
Chloride: 105 (ref 99–108)
Creatinine: 1 (ref 0.5–1.1)
Glucose: 108
Potassium: 4.7 (ref 3.4–5.3)
Sodium: 136 — AB (ref 137–147)

## 2021-11-17 LAB — COMPREHENSIVE METABOLIC PANEL
Calcium: 9.6 (ref 8.7–10.7)
GFR calc Af Amer: 62.5
GFR calc non Af Amer: 53.9

## 2021-11-21 DIAGNOSIS — R310 Gross hematuria: Secondary | ICD-10-CM | POA: Diagnosis not present

## 2021-11-21 DIAGNOSIS — C642 Malignant neoplasm of left kidney, except renal pelvis: Secondary | ICD-10-CM | POA: Diagnosis not present

## 2021-11-22 DIAGNOSIS — Z1231 Encounter for screening mammogram for malignant neoplasm of breast: Secondary | ICD-10-CM | POA: Diagnosis not present

## 2021-11-22 DIAGNOSIS — Z87898 Personal history of other specified conditions: Secondary | ICD-10-CM | POA: Diagnosis not present

## 2021-11-22 DIAGNOSIS — R928 Other abnormal and inconclusive findings on diagnostic imaging of breast: Secondary | ICD-10-CM | POA: Diagnosis not present

## 2021-11-23 ENCOUNTER — Other Ambulatory Visit: Payer: Self-pay

## 2021-11-23 ENCOUNTER — Inpatient Hospital Stay: Payer: Medicare HMO | Attending: Oncology | Admitting: Oncology

## 2021-11-23 VITALS — BP 154/73 | HR 98 | Temp 98.0°F | Resp 16 | Ht 60.0 in | Wt 141.1 lb

## 2021-11-23 DIAGNOSIS — Z905 Acquired absence of kidney: Secondary | ICD-10-CM | POA: Diagnosis not present

## 2021-11-23 DIAGNOSIS — Z7951 Long term (current) use of inhaled steroids: Secondary | ICD-10-CM | POA: Diagnosis not present

## 2021-11-23 DIAGNOSIS — Z79899 Other long term (current) drug therapy: Secondary | ICD-10-CM | POA: Diagnosis not present

## 2021-11-23 DIAGNOSIS — C642 Malignant neoplasm of left kidney, except renal pelvis: Secondary | ICD-10-CM | POA: Insufficient documentation

## 2021-11-23 DIAGNOSIS — I10 Essential (primary) hypertension: Secondary | ICD-10-CM | POA: Diagnosis not present

## 2021-11-23 DIAGNOSIS — E039 Hypothyroidism, unspecified: Secondary | ICD-10-CM | POA: Insufficient documentation

## 2021-11-23 NOTE — Progress Notes (Signed)
START ON PATHWAY REGIMEN - Renal Cell     A cycle is every 21 days:     Pembrolizumab   **Always confirm dose/schedule in your pharmacy ordering system**  Patient Characteristics: Postoperative without Neoadjuvant Therapy, M0 (Pathologic Staging), Stage I, II, III, or Resected T4M0 Stage IV, Intermediate-High Risk Therapeutic Status: Postoperative without Neoadjuvant Therapy, M0 (Pathologic Staging) AJCC M Category: cM0 AJCC 8 Stage Grouping: III AJCC T Category: pT3 AJCC N Category: pN0 Risk Status: Intermediate-High Risk Intent of Therapy: Curative Intent, Discussed with Patient 

## 2021-11-23 NOTE — Progress Notes (Signed)
Reason for the request:    Kidney cancer  HPI: I was asked by Dr. Lovena Neighbours to evaluate Donna Andersen for the evaluation of her kidney cancer.  She is a 81 year old woman who was found to have a an abnormal left kidney mass on ultrasound after evaluation for possible recurrent UTI.  MRI obtained on August 15, 2021 showed 7.1 x 7.2 x 6 cm mass in the upper pole of the left kidney with a 2.3 cm exophytic lesion in the interpolar left kidney.  Chest x-ray obtained at that point showed no evidence of metastatic disease.  Based on these findings, she underwent robotic assisted laparoscopic left radical nephrectomy completed on September 27, 2021.  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  She recovered very well from her procedure and has resumed all activities of daily living.  He denies any flank pain or difficulty breathing.  She denies any hematuria or dysuria.  Her performance status quality of life remains unchanged.   She does not report any headaches, blurry vision, syncope or seizures. Does not report any fevers, chills or sweats.  Does not report any cough, wheezing or hemoptysis.  Does not report any chest pain, palpitation, orthopnea or leg edema.  Does not report any nausea, vomiting or abdominal pain.  Does not report any constipation or diarrhea.  Does not report any skeletal complaints.    Does not report frequency, urgency or hematuria.  Does not report any skin rashes or lesions. Does not report any heat or cold intolerance.  Does not report any lymphadenopathy or petechiae.  Does not report any anxiety or depression.  Remaining review of systems is negative.     Past Medical History:  Diagnosis Date   Acute cystitis 09/26/2007   recurrent UTI's   Allergy    Atypical nevus 11/17/2002   Upper Center Back - Moderate   Atypical nevus 11/17/2002   Right Lower Abdomen - Moderate    Atypical nevus 04/23/2006   Left Upper Arm - Slight to Moderate   Atypical nevus 04/23/2006   Left Outer Lower Leg - Slight to Moderate   Atypical nevus 04/15/2012   Left Upper Buttock - Mild   Atypical nevus 08/25/2014   Right Inner Knee - Moderate   Atypical nevus 08/25/2014   Left Scapula - Severe   Basal cell carcinoma 08/25/2014   left axilla tx -curet and cautery   COUGH DUE TO ACE INHIBITORS 06/20/2009   FIBROCYSTIC BREAST DISEASE 10/10/2009   GOITER, MULTINODULAR 10/11/2009   Benign L thyroid nodule   Headache(784.0) 06/08/2008   HEMATURIA UNSPECIFIED 10/05/2008   HYPERGLYCEMIA 09/26/2007   HYPERLIPIDEMIA 07/21/2007   HYPERTENSION 11/22/2008   HYPOTHYROIDISM 07/21/2007   INSOMNIA 06/08/2008   Melanoma in situ ([place]) 03/07/2011   Base Of Post Neck   Melanoma in situ [facility]) 05/07/2011   Base Of Post Neck - Clear   MYALGIA 10/23/2010   OSTEOPOROSIS 07/21/2007   Renal cell carcinoma ([place]) 09/07/2021   Right nephrectomy 09/2021. Dr. 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   Right Buccal Cheek (in situ)([id number])   WEIGHT GAIN 06/08/2008  :   Past Surgical History:  Procedure Laterality Date   ABDOMINAL HYSTERECTOMY  1974   BREAST LUMPECTOMY  1976   benign   COLONOSCOPY     POLYPECTOMY     ROBOT ASSISTED LAPAROSCOPIC NEPHRECTOMY Left 09/27/2021   Procedure: XI ROBOTIC  ASSISTED LAPAROSCOPIC RADICAL NEPHRECTOMY;  Surgeon: Ceasar Mons, MD;  Location: [facility];  Service: Urology;  Laterality: Left;   THYROID SURGERY  2011   left nodule removed   VAGINAL DELIVERY     x1  :   Current Outpatient Medications:    calcium carbonate (OS-CAL - DOSED IN MG OF ELEMENTAL CALCIUM) 1250 MG tablet, Take 1 tablet by mouth every 14 (fourteen) days., Disp: , Rfl:    Cholecalciferol (VITAMIN D3) 2400 UNIT/ML LIQD, Take 2,400 Units by mouth daily., Disp: , Rfl:    docusate sodium (COLACE) 100 MG capsule, Take 1 capsule (100 mg total) by mouth 2  (two) times daily as needed for mild constipation., Disp: 30 capsule, Rfl: 0   EPINEPHrine 0.3 mg/0.3 mL IJ SOAJ injection, Inject 0.3 mg into the muscle as needed for anaphylaxis., Disp: , Rfl:    olmesartan (BENICAR) 40 MG tablet, TAKE 1 TABLET BY MOUTH EVERY DAY (Patient taking differently: Take 40 mg by mouth every evening.), Disp: 90 tablet, Rfl: 3   rosuvastatin (CRESTOR) 5 MG tablet, TAKE 5MG  BY MOUTH EVERY OTHER DAY, Disp: 45 tablet, Rfl: 3   SYMBICORT 80-4.5 MCG/ACT inhaler, Inhale 2 puffs into the lungs daily as needed (allergies)., Disp: , Rfl: :   Allergies  Allergen Reactions   Bee Venom Hives   Atorvastatin     REACTION: dental pain, myalgias, and headache   Lisinopril     REACTION: cough   Losartan Potassium     REACTION: drowsiness   Sulfonamide Derivatives     REACTION: Rash, Hives, Asthma   Latex Itching and Rash     scars   Other Rash and Hives    shrimp. shrimp - Anaphylaxis (swelling & hives) Tape. Tape - scars, itching from latex tape  :   Family History  Problem Relation Age of Onset   Cancer Mother        Ovarian Cancer   Ovarian cancer Mother    Hypertension Mother    Thyroid disease Mother    Lymphoma Sister    Cancer Sister        Breast Cancer, Kidney Cancer   Leukemia Sister    Colon cancer Neg Hx    Rectal cancer Neg Hx    Stomach cancer Neg Hx   :   Social History   Socioeconomic History   Marital status: Married    Spouse name: Not on file   Number of children: Not on file   Years of education: Not on file   Highest education level: Not on file  Occupational History   Occupation: Homemaker    Employer: RETIRED  Tobacco Use   Smoking status: Never   Smokeless tobacco: Never  Vaping Use   Vaping Use: Never used  Substance and Sexual Activity   Alcohol use: Yes    Alcohol/week: 0.0 standard drinks    Comment: socially-2 glasses a week   Drug use: Never   Sexual activity: Not on file  Other Topics Concern   Not on file   Social History Narrative   Does not work outside the home   Social Determinants of Health   Financial Resource Strain: Not on file  Food Insecurity: Not on file  Transportation Needs: Not on file  Physical Activity: Not on file  Stress: Not on file  Social Connections: Not on file  Intimate Partner Violence: Not on file  :  Pertinent items are noted in HPI.  Exam:  General appearance: alert and  cooperative appeared without distress. Head: atraumatic without any abnormalities. Eyes: conjunctivae/corneas clear. PERRL.  Sclera anicteric. Throat: lips, mucosa, and tongue normal; without oral thrush or ulcers. Resp: clear to auscultation bilaterally without rhonchi, wheezes or dullness to percussion. Cardio: regular rate and rhythm, S1, S2 normal, no murmur, click, rub or gallop GI: soft, non-tender; bowel sounds normal; no masses,  no organomegaly Skin: Skin color, texture, turgor normal. No rashes or lesions Lymph nodes: Cervical, supraclavicular, and axillary nodes normal. Neurologic: Grossly normal without any motor, sensory or deep tendon reflexes. Musculoskeletal: No joint deformity or effusion.    Assessment and Plan:   81 year old woman with:  1.  Left renal cell carcinoma diagnosed in November 2022.  She was found to have T3a clear-cell with sarcomatoid features after presenting with a 7 cm mass.  She is status post radical nephrectomy without any evidence of metastatic disease.  Treatment options moving forward were discussed at this time.  The role of adjuvant therapy were discussed including oral targeted therapy versus immunotherapy.  The standard of care at this time is the use of Pembrolizumab based on the results of [id number] which showed improvement in disease-free survival especially for high risk features associated with sarcomatoid component.  Alternative treatment options would be active surveillance only.  Complication associated with immunotherapy were  reviewed at this time.  These would include dermatitis, fatigue, thyroid disease among others.  Serious complication such as pneumonitis, colitis, hepatitis and hypophysitis were also reviewed.  After discussion today, she is agreeable to proceed and we will set up education class prior to start.  She will continue to have repeat imaging studies for surveillance purposes despite receiving immunotherapy.  2.  IV access: Peripheral veins will be used at this time.  3.  Antiemetics: Compazine will be available to her.  4.  Autoimmune complications surveillance: Continue to follow her thyroid function as well as educate her about other complications.  5.  Follow-up: We will be in the first week of March to commence therapy based on her wishes.  60  minutes were dedicated to this visit. The time was spent on reviewing pathology results, imaging studies, discussing treatment options,and answering questions regarding future plan.   A copy of this consult has been forwarded to the requesting physician.

## 2021-11-27 ENCOUNTER — Encounter: Payer: Self-pay | Admitting: Family Medicine

## 2021-12-04 ENCOUNTER — Encounter: Payer: Self-pay | Admitting: Oncology

## 2021-12-11 DIAGNOSIS — Z0100 Encounter for examination of eyes and vision without abnormal findings: Secondary | ICD-10-CM | POA: Diagnosis not present

## 2021-12-13 ENCOUNTER — Other Ambulatory Visit: Payer: Self-pay

## 2021-12-13 ENCOUNTER — Ambulatory Visit (INDEPENDENT_AMBULATORY_CARE_PROVIDER_SITE_OTHER): Payer: Medicare HMO | Admitting: Family Medicine

## 2021-12-13 ENCOUNTER — Encounter: Payer: Self-pay | Admitting: Family Medicine

## 2021-12-13 VITALS — BP 132/71 | HR 76 | Temp 98.1°F | Wt 141.6 lb

## 2021-12-13 DIAGNOSIS — I10 Essential (primary) hypertension: Secondary | ICD-10-CM

## 2021-12-13 DIAGNOSIS — E871 Hypo-osmolality and hyponatremia: Secondary | ICD-10-CM | POA: Diagnosis not present

## 2021-12-13 DIAGNOSIS — C642 Malignant neoplasm of left kidney, except renal pelvis: Secondary | ICD-10-CM

## 2021-12-13 MED ORDER — CEPHALEXIN 500 MG PO CAPS: 500.0000 mg | ORAL_CAPSULE | Freq: Two times a day (BID) | ORAL | 0 refills | Status: DC

## 2021-12-13 NOTE — Patient Instructions (Signed)
Please follow up as scheduled for your next visit with me: 03/12/2022   If you have any questions or concerns, please don't hesitate to send me a message via MyChart or call the office at [phone]. Thank you for visiting with [place] today! It's our pleasure caring for you.

## 2021-12-13 NOTE — Progress Notes (Signed)
Subjective  CC:  Chief Complaint  Patient presents with   Hypertension    6 week f/u after holding HCTZ and checking sodium levels     HPI: Donna Andersen is a 81 y.o. female who presents to the office today to address the problems listed above in the chief complaint. Hypertension f/u: Control is good . Pt reports she is doing well. taking medications as instructed, no medication side effects noted, no TIAs, no chest pain on exertion, no dyspnea on exertion, no swelling of ankles. Home readings are stable in 130s/ 70s. On olmesartan 40; no longer on diuretic due to hyponatremia. She denies adverse effects from his BP medications. Compliance with medication is good.  RCC: to start immunotherapy in march. F/u with oncology and surgery have gone well. Postoperatively is doing well.  C/o h/o recurrent WIO:XBDZHGD for beach today: requests abx to use in case she gets a UTI there.   Assessment  1. White coat syndrome with diagnosis of hypertension   2. Hyponatremia   3. Clear cell renal cell carcinoma, left (HCC)      Plan   Hypertension f/u: BP control is fairly well controlled. Continue home readings due to white coat response. Continue olmesartan 40 daily alone for now. Recheck in may; can add amlodipine at that time if home readings elevated.  Hyponatremia: pt had f/u with renal and reports had nl recent bmp. Resolved since off hctz Keflex: to be used if develops classic UTI sxs  Education regarding management of these chronic disease states was given. Management strategies discussed on successive visits include dietary and exercise recommendations, goals of achieving and maintaining IBW, and lifestyle modifications aiming for adequate sleep and minimizing stressors.   Follow up: may for cpe as scheduled  No orders of the defined types were placed in this encounter.  Meds ordered this encounter  Medications   cephALEXin (KEFLEX) 500 MG capsule    Sig: Take 1 capsule (500 mg total)  by mouth 2 (two) times daily.    Dispense:  10 capsule    Refill:  0      BP Readings from Last 3 Encounters:  12/13/21 132/71  11/23/21 (!) 154/73  10/18/21 136/88   Wt Readings from Last 3 Encounters:  12/13/21 141 lb 9.6 oz (64.2 kg)  11/23/21 141 lb 1.6 oz (64 kg)  10/18/21 137 lb (62.1 kg)    Lab Results  Component Value Date   CHOL 214 (H) 03/27/2021   CHOL 216 (H) 04/08/2020   CHOL 215 (H) 04/03/2019   Lab Results  Component Value Date   HDL 58.50 03/27/2021   HDL 59.70 04/08/2020   HDL 55.60 04/03/2019   Lab Results  Component Value Date   LDLCALC 126 (H) 03/27/2021   LDLCALC 128 (H) 04/08/2020   LDLCALC 126 (H) 04/03/2019   Lab Results  Component Value Date   TRIG 149.0 03/27/2021   TRIG 138.0 04/08/2020   TRIG 167.0 (H) 04/03/2019   Lab Results  Component Value Date   CHOLHDL 4 03/27/2021   CHOLHDL 4 04/08/2020   CHOLHDL 4 04/03/2019   Lab Results  Component Value Date   LDLDIRECT 109.0 02/16/2015   LDLDIRECT 145.4 01/26/2013   LDLDIRECT 130.4 01/25/2012   Lab Results  Component Value Date   CREATININE 1.0 11/17/2021   BUN 24 (A) 11/17/2021   NA 136 (A) 11/17/2021   K 4.7 11/17/2021   CL 105 11/17/2021   CO2 25 (A) 11/17/2021  The 10-year ASCVD risk score (Arnett DK, et al., 2019) is: 30%   Values used to calculate the score:     Age: 58 years     Sex: Female     Is Non-Hispanic African American: No     Diabetic: No     Tobacco smoker: No     Systolic Blood Pressure: 761 mmHg     Is BP treated: Yes     HDL Cholesterol: 58.5 mg/dL     Total Cholesterol: 214 mg/dL  I reviewed the patients updated PMH, FH, and SocHx.    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    Priority: High   Tubular adenoma of colon 09/07/2021    Priority: High   White coat syndrome with diagnosis of hypertension 11/22/2008    Priority: High   Mixed hyperlipidemia 07/21/2007     Priority: High   Left renal mass 09/27/2021    Priority: Medium    Osteopenia 07/21/2007    Priority: Medium    Environmental allergies 06/27/2018    Priority: Low   Fibrocystic breast changes, bilateral 10/10/2009    Priority: Low   ACE inhibitor intolerance 06/20/2009    Priority: Low    Allergies: Bee venom, Atorvastatin, Lisinopril, Losartan potassium, Sulfonamide derivatives, Latex, and Other  Social History: Patient  reports that she has never smoked. She has never used smokeless tobacco. She reports current alcohol use. She reports that she does not use drugs.  Current Meds  Medication Sig   calcium carbonate (OS-CAL - DOSED IN MG OF ELEMENTAL CALCIUM) 1250 MG tablet Take 1 tablet by mouth every 14 (fourteen) days.   cephALEXin (KEFLEX) 500 MG capsule Take 1 capsule (500 mg total) by mouth 2 (two) times daily.   Cholecalciferol (VITAMIN D3) 2400 UNIT/ML LIQD Take 2,400 Units by mouth daily.   docusate sodium (COLACE) 100 MG capsule Take 1 capsule (100 mg total) by mouth 2 (two) times daily as needed for mild constipation.   EPINEPHrine 0.3 mg/0.3 mL IJ SOAJ injection Inject 0.3 mg into the muscle as needed for anaphylaxis.   olmesartan (BENICAR) 40 MG tablet TAKE 1 TABLET BY MOUTH EVERY DAY (Patient taking differently: Take 40 mg by mouth every evening.)   rosuvastatin (CRESTOR) 5 MG tablet TAKE 5MG  BY MOUTH EVERY OTHER DAY   SYMBICORT 80-4.5 MCG/ACT inhaler Inhale 2 puffs into the lungs daily as needed (allergies).    Review of Systems: Cardiovascular: negative for chest pain, palpitations, leg swelling, orthopnea Respiratory: negative for SOB, wheezing or persistent cough Gastrointestinal: negative for abdominal pain Genitourinary: negative for dysuria or gross hematuria  Objective  Vitals: BP 132/71 Comment: home bp readings   Pulse 76    Temp 98.1 F (36.7 C) (Temporal)    Wt 141 lb 9.6 oz (64.2 kg)    LMP  (LMP Unknown)    SpO2 99%    BMI 27.65 kg/m  General: no  acute distress  Psych:  Alert and oriented, normal mood and affect Cardiovascular:  RRR without murmur. no edema Respiratory:  Good breath sounds bilaterally, CTAB with normal respiratory effort  Commons side effects, risks, benefits, and alternatives for medications and treatment plan prescribed today were discussed, and the patient expressed understanding of the given instructions. Patient is instructed to call or message via MyChart if he/she has any questions or concerns regarding our treatment plan. No barriers to understanding were identified. We discussed Red Flag  symptoms and signs in detail. Patient expressed understanding regarding what to do in case of urgent or emergency type symptoms.  Medication list was reconciled, printed and provided to the patient in AVS. Patient instructions and summary information was reviewed with the patient as documented in the AVS. This note was prepared with assistance of Dragon voice recognition software. Occasional wrong-word or sound-a-like substitutions may have occurred due to the inherent limitations of voice recognition software  This visit occurred during the SARS-CoV-2 public health emergency.  Safety protocols were in place, including screening questions prior to the visit, additional usage of staff PPE, and extensive cleaning of exam room while observing appropriate contact time as indicated for disinfecting solutions.

## 2021-12-14 ENCOUNTER — Telehealth: Payer: Self-pay | Admitting: Family Medicine

## 2021-12-14 NOTE — Telephone Encounter (Signed)
Copied from [place] [id number]. Topic: Medicare AWV >> Dec 14, 2021 10:20 AM Harris-Coley, Hannah Beat wrote: Reason for CRM: Left message for patient to schedule Annual Wellness Visit.  Please schedule with Nurse Health Advisor Charlott Rakes, RN at [facility].  Please call [phone] ask for [facility]

## 2022-01-01 ENCOUNTER — Encounter: Payer: Self-pay | Admitting: Oncology

## 2022-01-02 NOTE — Progress Notes (Signed)
Pharmacist Chemotherapy Monitoring - Initial Assessment   ? ?Anticipated start date: 01/09/22  ? ?The following has been reviewed per standard work regarding the patient's treatment regimen: ?The patient's diagnosis, treatment plan and drug doses, and organ/hematologic function ?Lab orders and baseline tests specific to treatment regimen  ?The treatment plan start date, drug sequencing, and pre-medications ?Prior authorization status  ?Patient's documented medication list, including drug-drug interaction screen and prescriptions for anti-emetics and supportive care specific to the treatment regimen ?The drug concentrations, fluid compatibility, administration routes, and timing of the medications to be used ?The patient's access for treatment and lifetime cumulative dose history, if applicable  ?The patient's medication allergies and previous infusion related reactions, if applicable  ? ?Changes made to treatment plan:  ?N/A ? ?Follow up needed:  ?Pending authorization for treatment  ?Keflex Rx complete?  If so, need to take off med list. ? ? ?Kennith Center, Pharm.D., CPP ?01/02/2022@9 :46 AM ? ? ? ?

## 2022-01-04 ENCOUNTER — Inpatient Hospital Stay: Payer: Medicare HMO | Attending: Oncology

## 2022-01-04 ENCOUNTER — Other Ambulatory Visit: Payer: Self-pay

## 2022-01-04 DIAGNOSIS — Z905 Acquired absence of kidney: Secondary | ICD-10-CM | POA: Insufficient documentation

## 2022-01-04 DIAGNOSIS — C642 Malignant neoplasm of left kidney, except renal pelvis: Secondary | ICD-10-CM

## 2022-01-04 DIAGNOSIS — Z79899 Other long term (current) drug therapy: Secondary | ICD-10-CM | POA: Insufficient documentation

## 2022-01-04 DIAGNOSIS — Z5111 Encounter for antineoplastic chemotherapy: Secondary | ICD-10-CM | POA: Insufficient documentation

## 2022-01-04 MED ORDER — PROCHLORPERAZINE MALEATE 10 MG PO TABS: 10.0000 mg | ORAL_TABLET | Freq: Four times a day (QID) | ORAL | 0 refills | Status: DC | PRN

## 2022-01-09 ENCOUNTER — Other Ambulatory Visit: Payer: Self-pay

## 2022-01-09 ENCOUNTER — Inpatient Hospital Stay: Payer: Medicare HMO

## 2022-01-09 VITALS — BP 164/87 | HR 70 | Temp 98.2°F | Resp 16 | Wt 141.2 lb

## 2022-01-09 DIAGNOSIS — C642 Malignant neoplasm of left kidney, except renal pelvis: Secondary | ICD-10-CM

## 2022-01-09 DIAGNOSIS — Z5111 Encounter for antineoplastic chemotherapy: Secondary | ICD-10-CM | POA: Diagnosis not present

## 2022-01-09 DIAGNOSIS — Z79899 Other long term (current) drug therapy: Secondary | ICD-10-CM | POA: Diagnosis not present

## 2022-01-09 DIAGNOSIS — Z905 Acquired absence of kidney: Secondary | ICD-10-CM | POA: Diagnosis not present

## 2022-01-09 LAB — CBC WITH DIFFERENTIAL (CANCER CENTER ONLY)
Abs Immature Granulocytes: 0.06 10*3/uL (ref 0.00–0.07)
Basophils Absolute: 0.1 10*3/uL (ref 0.0–0.1)
Basophils Relative: 1 %
Eosinophils Absolute: 0.1 10*3/uL (ref 0.0–0.5)
Eosinophils Relative: 1 %
HCT: 36.5 % (ref 36.0–46.0)
Hemoglobin: 12.4 g/dL (ref 12.0–15.0)
Immature Granulocytes: 1 %
Lymphocytes Relative: 18 %
Lymphs Abs: 1.8 10*3/uL (ref 0.7–4.0)
MCH: 31.2 pg (ref 26.0–34.0)
MCHC: 34 g/dL (ref 30.0–36.0)
MCV: 91.7 fL (ref 80.0–100.0)
Monocytes Absolute: 0.7 10*3/uL (ref 0.1–1.0)
Monocytes Relative: 7 %
Neutro Abs: 7.4 10*3/uL (ref 1.7–7.7)
Neutrophils Relative %: 72 %
Platelet Count: 284 10*3/uL (ref 150–400)
RBC: 3.98 MIL/uL (ref 3.87–5.11)
RDW: 13.3 % (ref 11.5–15.5)
WBC Count: 10.1 10*3/uL (ref 4.0–10.5)
nRBC: 0 % (ref 0.0–0.2)

## 2022-01-09 LAB — CMP (CANCER CENTER ONLY)
ALT: 15 U/L (ref 0–44)
AST: 17 U/L (ref 15–41)
Albumin: 4.2 g/dL (ref 3.5–5.0)
Alkaline Phosphatase: 72 U/L (ref 38–126)
Anion gap: 8 (ref 5–15)
BUN: 24 mg/dL — ABNORMAL HIGH (ref 8–23)
CO2: 24 mmol/L (ref 22–32)
Calcium: 9.2 mg/dL (ref 8.9–10.3)
Chloride: 105 mmol/L (ref 98–111)
Creatinine: 1.12 mg/dL — ABNORMAL HIGH (ref 0.44–1.00)
GFR, Estimated: 50 mL/min — ABNORMAL LOW (ref >60)
Glucose, Bld: 97 mg/dL (ref 70–99)
Potassium: 4.7 mmol/L (ref 3.5–5.1)
Sodium: 137 mmol/L (ref 135–145)
Total Bilirubin: 0.4 mg/dL (ref 0.3–1.2)
Total Protein: 6.4 g/dL — ABNORMAL LOW (ref 6.5–8.1)

## 2022-01-09 LAB — TSH: TSH: 1.422 u[IU]/mL (ref 0.308–3.960)

## 2022-01-09 MED ORDER — SODIUM CHLORIDE 0.9 % IV SOLN
200.0000 mg | Freq: Once | INTRAVENOUS | Status: AC
  Administered 2022-01-09: 200 mg via INTRAVENOUS
  Filled 2022-01-09: qty 200

## 2022-01-09 MED ORDER — SODIUM CHLORIDE 0.9 % IV SOLN: Freq: Once | INTRAVENOUS | Status: AC

## 2022-01-09 NOTE — Patient Instructions (Addendum)
[facility]  Discharge Instructions: ?Thank you for choosing [facility] to provide your oncology and hematology care.  ? ?If you have a lab appointment with the [facility], please go directly to the [facility] and check in at the registration area. ?  ?Wear comfortable clothing and clothing appropriate for easy access to any Portacath or PICC line.  ? ?We strive to give you quality time with your provider. You may need to reschedule your appointment if you arrive late (15 or more minutes).  Arriving late affects you and other patients whose appointments are after yours.  Also, if you miss three or more appointments without notifying the office, you may be dismissed from the clinic at the provider?s discretion.    ?  ?For prescription refill requests, have your pharmacy contact our office and allow 72 hours for refills to be completed.   ? ?Today you received the following chemotherapy and/or immunotherapy agents keytruda (pembrolizumab) ?  ?To help prevent nausea and vomiting after your treatment, we encourage you to take your nausea medication as directed. ? ?BELOW ARE SYMPTOMS THAT SHOULD BE REPORTED IMMEDIATELY: ?*FEVER GREATER THAN 100.4 F (38 ?C) OR HIGHER ?*CHILLS OR SWEATING ?*NAUSEA AND VOMITING THAT IS NOT CONTROLLED WITH YOUR NAUSEA MEDICATION ?*UNUSUAL SHORTNESS OF BREATH ?*UNUSUAL BRUISING OR BLEEDING ?*URINARY PROBLEMS (pain or burning when urinating, or frequent urination) ?*BOWEL PROBLEMS (unusual diarrhea, constipation, pain near the anus) ?TENDERNESS IN MOUTH AND THROAT WITH OR WITHOUT PRESENCE OF ULCERS (sore throat, sores in mouth, or a toothache) ?UNUSUAL RASH, SWELLING OR PAIN  ?UNUSUAL VAGINAL DISCHARGE OR ITCHING  ? ?Items with * indicate a potential emergency and should be followed up as soon as possible or go to the Emergency Department if any problems should occur. ? ?Please show the CHEMOTHERAPY ALERT CARD or IMMUNOTHERAPY ALERT CARD at  check-in to the Emergency Department and triage nurse. ? ?Should you have questions after your visit or need to cancel or reschedule your appointment, please contact [facility]  Dept: [phone]  and follow the prompts.  Office hours are 8:00 a.m. to 4:30 p.m. Monday - Friday. Please note that voicemails left after 4:00 p.m. may not be returned until the following business day.  We are closed weekends and major holidays. You have access to a nurse at all times for urgent questions. Please call the main number to the clinic Dept: [phone] and follow the prompts. ? ? ?For any non-urgent questions, you may also contact your provider using MyChart. We now offer e-Visits for anyone 68 and older to request care online for non-urgent symptoms. For details visit [email or web address]. ?  ?Also download the MyChart app! Go to the app store, search "MyChart", open the app, select [facility], and log in with your MyChart username and password. ? ?Due to Covid, a mask is required upon entering the hospital/clinic. If you do not have a mask, one will be given to you upon arrival. For doctor visits, patients may have 1 support person aged 79 or older with them. For treatment visits, patients cannot have anyone with them due to current Covid guidelines and our immunocompromised population.  ? ?Pembrolizumab injection ?What is this medication? ?PEMBROLIZUMAB (pem broe liz ue mab) is a monoclonal antibody. It is used to treat certain types of cancer. ?This medicine may be used for other purposes; ask your health care provider or pharmacist if you have questions. ?COMMON BRAND NAME(S): Keytruda ?What should I  tell my care team before I take this medication? ?They need to know if you have any of these conditions: ?autoimmune diseases like Crohn's disease, ulcerative colitis, or lupus ?have had or planning to have an allogeneic stem cell transplant (uses someone else's stem cells) ?history of  organ transplant ?history of chest radiation ?nervous system problems like myasthenia gravis or Guillain-Barre syndrome ?an unusual or allergic reaction to pembrolizumab, other medicines, foods, dyes, or preservatives ?pregnant or trying to get pregnant ?breast-feeding ?How should I use this medication? ?This medicine is for infusion into a vein. It is given by a health care professional in a hospital or clinic setting. ?A special MedGuide will be given to you before each treatment. Be sure to read this information carefully each time. ?Talk to your pediatrician regarding the use of this medicine in children. While this drug may be prescribed for children as young as 6 months for selected conditions, precautions do apply. ?Overdosage: If you think you have taken too much of this medicine contact a poison control center or emergency room at once. ?NOTE: This medicine is only for you. Do not share this medicine with others. ?What if I miss a dose? ?It is important not to miss your dose. Call your doctor or health care professional if you are unable to keep an appointment. ?What may interact with this medication? ?Interactions have not been studied. ?This list may not describe all possible interactions. Give your health care provider a list of all the medicines, herbs, non-prescription drugs, or dietary supplements you use. Also tell them if you smoke, drink alcohol, or use illegal drugs. Some items may interact with your medicine. ?What should I watch for while using this medication? ?Your condition will be monitored carefully while you are receiving this medicine. ?You may need blood work done while you are taking this medicine. ?Do not become pregnant while taking this medicine or for 4 months after stopping it. Women should inform their doctor if they wish to become pregnant or think they might be pregnant. There is a potential for serious side effects to an unborn child. Talk to your health care professional or  pharmacist for more information. Do not breast-feed an infant while taking this medicine or for 4 months after the last dose. ?What side effects may I notice from receiving this medication? ?Side effects that you should report to your doctor or health care professional as soon as possible: ?allergic reactions like skin rash, itching or hives, swelling of the face, lips, or tongue ?bloody or black, tarry ?breathing problems ?changes in vision ?chest pain ?chills ?confusion ?constipation ?cough ?diarrhea ?dizziness or feeling faint or lightheaded ?fast or irregular heartbeat ?fever ?flushing ?joint pain ?low blood counts - this medicine may decrease the number of white blood cells, red blood cells and platelets. You may be at increased risk for infections and bleeding. ?muscle pain ?muscle weakness ?pain, tingling, numbness in the hands or feet ?persistent headache ?redness, blistering, peeling or loosening of the skin, including inside the mouth ?signs and symptoms of high blood sugar such as dizziness; dry mouth; dry skin; fruity breath; nausea; stomach pain; increased hunger or thirst; increased urination ?signs and symptoms of kidney injury like trouble passing urine or change in the amount of urine ?signs and symptoms of liver injury like dark urine, light-colored stools, loss of appetite, nausea, right upper belly pain, yellowing of the eyes or skin ?sweating ?swollen lymph nodes ?weight loss ?Side effects that usually do not require medical attention (report  to your doctor or health care professional if they continue or are bothersome): ?decreased appetite ?hair loss ?tiredness ?This list may not describe all possible side effects. Call your doctor for medical advice about side effects. You may report side effects to FDA at 1-800-FDA-1088. ?Where should I keep my medication? ?This drug is given in a hospital or clinic and will not be stored at home. ?NOTE: This sheet is a summary. It may not cover all possible  information. If you have questions about this medicine, talk to your doctor, pharmacist, or health care provider. ?? 2022 Elsevier/Gold Standard (2021-07-11 00:00:00) ? ? ?

## 2022-01-10 ENCOUNTER — Telehealth: Payer: Self-pay

## 2022-01-10 NOTE — Telephone Encounter (Signed)
Donna Andersen states she is doing well. Eating, drinking and urinating well. She is out for lunch. ?She has experienced some tingly itchy feeling occasionally behind her neck and arms. Not severe. Told her she could try benadryl 25 mg this evening if needed. She will call the office if she sees any rash or the itching gets worse. ?

## 2022-01-10 NOTE — Telephone Encounter (Signed)
-----   Message from Alvera Singh, RN sent at 01/09/2022  4:00 PM EST ----- ?Regarding: first time Donna Andersen ?Patient is a first time [place] with [facility]. She tolerated her chemo really well. ? ?

## 2022-01-30 ENCOUNTER — Other Ambulatory Visit: Payer: Self-pay

## 2022-01-30 ENCOUNTER — Inpatient Hospital Stay: Payer: Medicare HMO

## 2022-01-30 ENCOUNTER — Inpatient Hospital Stay: Payer: Medicare HMO | Admitting: Oncology

## 2022-01-30 VITALS — BP 156/78 | HR 83 | Temp 97.9°F | Resp 17 | Ht 60.0 in | Wt 142.1 lb

## 2022-01-30 DIAGNOSIS — Z79899 Other long term (current) drug therapy: Secondary | ICD-10-CM | POA: Diagnosis not present

## 2022-01-30 DIAGNOSIS — C642 Malignant neoplasm of left kidney, except renal pelvis: Secondary | ICD-10-CM

## 2022-01-30 DIAGNOSIS — Z905 Acquired absence of kidney: Secondary | ICD-10-CM | POA: Diagnosis not present

## 2022-01-30 DIAGNOSIS — Z5111 Encounter for antineoplastic chemotherapy: Secondary | ICD-10-CM | POA: Diagnosis not present

## 2022-01-30 LAB — CBC WITH DIFFERENTIAL (CANCER CENTER ONLY)
Abs Immature Granulocytes: 0.04 10*3/uL (ref 0.00–0.07)
Basophils Absolute: 0.1 10*3/uL (ref 0.0–0.1)
Basophils Relative: 1 %
Eosinophils Absolute: 0.2 10*3/uL (ref 0.0–0.5)
Eosinophils Relative: 2 %
HCT: 39 % (ref 36.0–46.0)
Hemoglobin: 12.8 g/dL (ref 12.0–15.0)
Immature Granulocytes: 1 %
Lymphocytes Relative: 23 %
Lymphs Abs: 1.8 10*3/uL (ref 0.7–4.0)
MCH: 30.5 pg (ref 26.0–34.0)
MCHC: 32.8 g/dL (ref 30.0–36.0)
MCV: 93.1 fL (ref 80.0–100.0)
Monocytes Absolute: 0.5 10*3/uL (ref 0.1–1.0)
Monocytes Relative: 6 %
Neutro Abs: 5.2 10*3/uL (ref 1.7–7.7)
Neutrophils Relative %: 67 %
Platelet Count: 273 10*3/uL (ref 150–400)
RBC: 4.19 MIL/uL (ref 3.87–5.11)
RDW: 12.8 % (ref 11.5–15.5)
WBC Count: 7.7 10*3/uL (ref 4.0–10.5)
nRBC: 0 % (ref 0.0–0.2)

## 2022-01-30 LAB — CMP (CANCER CENTER ONLY)
ALT: 15 U/L (ref 0–44)
AST: 15 U/L (ref 15–41)
Albumin: 4.1 g/dL (ref 3.5–5.0)
Alkaline Phosphatase: 78 U/L (ref 38–126)
Anion gap: 8 (ref 5–15)
BUN: 22 mg/dL (ref 8–23)
CO2: 25 mmol/L (ref 22–32)
Calcium: 9.5 mg/dL (ref 8.9–10.3)
Chloride: 106 mmol/L (ref 98–111)
Creatinine: 1.21 mg/dL — ABNORMAL HIGH (ref 0.44–1.00)
GFR, Estimated: 46 mL/min — ABNORMAL LOW (ref >60)
Glucose, Bld: 90 mg/dL (ref 70–99)
Potassium: 4.2 mmol/L (ref 3.5–5.1)
Sodium: 139 mmol/L (ref 135–145)
Total Bilirubin: 0.5 mg/dL (ref 0.3–1.2)
Total Protein: 6.7 g/dL (ref 6.5–8.1)

## 2022-01-30 LAB — TSH: TSH: 1.609 u[IU]/mL (ref 0.308–3.960)

## 2022-01-30 MED ORDER — SODIUM CHLORIDE 0.9 % IV SOLN
200.0000 mg | Freq: Once | INTRAVENOUS | Status: AC
  Administered 2022-01-30: 200 mg via INTRAVENOUS
  Filled 2022-01-30: qty 200

## 2022-01-30 MED ORDER — SODIUM CHLORIDE 0.9 % IV SOLN: Freq: Once | INTRAVENOUS | Status: AC

## 2022-01-30 NOTE — Patient Instructions (Signed)
Donna Andersen  Discharge Instructions: ?Thank you for choosing [facility] to provide your oncology and hematology care.  ? ?If you have a lab appointment with the [facility], please go directly to the [facility] and check in at the registration area. ?  ?Wear comfortable clothing and clothing appropriate for easy access to any Portacath or PICC line.  ? ?We strive to give you quality time with your provider. You may need to reschedule your appointment if you arrive late (15 or more minutes).  Arriving late affects you and other patients whose appointments are after yours.  Also, if you miss three or more appointments without notifying the office, you may be dismissed from the clinic at the provider?s discretion.    ?  ?For prescription refill requests, have your pharmacy contact our office and allow 72 hours for refills to be completed.   ? ?Today you received the following chemotherapy and/or immunotherapy agents keytruda (pembrolizumab) ?  ?To help prevent nausea and vomiting after your treatment, we encourage you to take your nausea medication as directed. ? ?BELOW ARE SYMPTOMS THAT SHOULD BE REPORTED IMMEDIATELY: ?*FEVER GREATER THAN 100.4 F (38 ?C) OR HIGHER ?*CHILLS OR SWEATING ?*NAUSEA AND VOMITING THAT IS NOT CONTROLLED WITH YOUR NAUSEA MEDICATION ?*UNUSUAL SHORTNESS OF BREATH ?*UNUSUAL BRUISING OR BLEEDING ?*URINARY PROBLEMS (pain or burning when urinating, or frequent urination) ?*BOWEL PROBLEMS (unusual diarrhea, constipation, pain near the anus) ?TENDERNESS IN MOUTH AND THROAT WITH OR WITHOUT PRESENCE OF ULCERS (sore throat, sores in mouth, or a toothache) ?UNUSUAL RASH, SWELLING OR PAIN  ?UNUSUAL VAGINAL DISCHARGE OR ITCHING  ? ?Items with * indicate a potential emergency and should be followed up as soon as possible or go to the Emergency Department if any problems should occur. ? ?Please show the CHEMOTHERAPY ALERT CARD or IMMUNOTHERAPY ALERT CARD at  check-in to the Emergency Department and triage nurse. ? ?Should you have questions after your visit or need to cancel or reschedule your appointment, please contact [facility]  Dept: [phone]  and follow the prompts.  Office hours are 8:00 a.m. to 4:30 p.m. Monday - Friday. Please note that voicemails left after 4:00 p.m. may not be returned until the following business day.  We are closed weekends and major holidays. You have access to a nurse at all times for urgent questions. Please call the main number to the clinic Dept: [phone] and follow the prompts. ? ? ?For any non-urgent questions, you may also contact your provider using MyChart. We now offer e-Visits for anyone 32 and older to request care online for non-urgent symptoms. For details visit [email or web address]. ?  ?Also download the MyChart app! Go to the app store, search "MyChart", open the app, select [facility], and log in with your MyChart username and password. ? ?Due to Covid, a mask is required upon entering the hospital/clinic. If you do not have a mask, one will be given to you upon arrival. For doctor visits, patients may have 1 support person aged 17 or older with them. For treatment visits, patients cannot have anyone with them due to current Covid guidelines and our immunocompromised population.  ? ?Pembrolizumab injection ?What is this medication? ?PEMBROLIZUMAB (pem broe liz ue mab) is a monoclonal antibody. It is used to treat certain types of cancer. ?This medicine may be used for other purposes; ask your health care provider or pharmacist if you have questions. ?COMMON BRAND NAME(S): Keytruda ?What should I  tell my care team before I take this medication? ?They need to know if you have any of these conditions: ?autoimmune diseases like Crohn's disease, ulcerative colitis, or lupus ?have had or planning to have an allogeneic stem cell transplant (uses someone else's stem cells) ?history of  organ transplant ?history of chest radiation ?nervous system problems like myasthenia gravis or Guillain-Barre syndrome ?an unusual or allergic reaction to pembrolizumab, other medicines, foods, dyes, or preservatives ?pregnant or trying to get pregnant ?breast-feeding ?How should I use this medication? ?This medicine is for infusion into a vein. It is given by a health care professional in a hospital or clinic setting. ?A special MedGuide will be given to you before each treatment. Be sure to read this information carefully each time. ?Talk to your pediatrician regarding the use of this medicine in children. While this drug may be prescribed for children as young as 6 months for selected conditions, precautions do apply. ?Overdosage: If you think you have taken too much of this medicine contact a poison control center or emergency room at once. ?NOTE: This medicine is only for you. Do not share this medicine with others. ?What if I miss a dose? ?It is important not to miss your dose. Call your doctor or health care professional if you are unable to keep an appointment. ?What may interact with this medication? ?Interactions have not been studied. ?This list may not describe all possible interactions. Give your health care provider a list of all the medicines, herbs, non-prescription drugs, or dietary supplements you use. Also tell them if you smoke, drink alcohol, or use illegal drugs. Some items may interact with your medicine. ?What should I watch for while using this medication? ?Your condition will be monitored carefully while you are receiving this medicine. ?You may need blood work done while you are taking this medicine. ?Do not become pregnant while taking this medicine or for 4 months after stopping it. Women should inform their doctor if they wish to become pregnant or think they might be pregnant. There is a potential for serious side effects to an unborn child. Talk to your health care professional or  pharmacist for more information. Do not breast-feed an infant while taking this medicine or for 4 months after the last dose. ?What side effects may I notice from receiving this medication? ?Side effects that you should report to your doctor or health care professional as soon as possible: ?allergic reactions like skin rash, itching or hives, swelling of the face, lips, or tongue ?bloody or black, tarry ?breathing problems ?changes in vision ?chest pain ?chills ?confusion ?constipation ?cough ?diarrhea ?dizziness or feeling faint or lightheaded ?fast or irregular heartbeat ?fever ?flushing ?joint pain ?low blood counts - this medicine may decrease the number of white blood cells, red blood cells and platelets. You may be at increased risk for infections and bleeding. ?muscle pain ?muscle weakness ?pain, tingling, numbness in the hands or feet ?persistent headache ?redness, blistering, peeling or loosening of the skin, including inside the mouth ?signs and symptoms of high blood sugar such as dizziness; dry mouth; dry skin; fruity breath; nausea; stomach pain; increased hunger or thirst; increased urination ?signs and symptoms of kidney injury like trouble passing urine or change in the amount of urine ?signs and symptoms of liver injury like dark urine, light-colored stools, loss of appetite, nausea, right upper belly pain, yellowing of the eyes or skin ?sweating ?swollen lymph nodes ?weight loss ?Side effects that usually do not require medical attention (report  to your doctor or health care professional if they continue or are bothersome): ?decreased appetite ?hair loss ?tiredness ?This list may not describe all possible side effects. Call your doctor for medical advice about side effects. You may report side effects to FDA at 1-800-FDA-1088. ?Where should I keep my medication? ?This drug is given in a hospital or clinic and will not be stored at home. ?NOTE: This sheet is a summary. It may not cover all possible  information. If you have questions about this medicine, talk to your doctor, pharmacist, or health care provider. ?? 2022 Elsevier/Gold Standard (2021-07-11 00:00:00) ? ? ?

## 2022-01-30 NOTE — Progress Notes (Signed)
Hematology and Oncology Follow Up Visit ? ?Donna Andersen ?[id number] ?08-Mar-1941 81 y.o. ?01/30/2022 9:02 AM ?Berton Lan, [name], MD  ? ?Principle Diagnosis: 71 year old woman with kidney cancer diagnosed in November 2022.  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 ? ? ?Prior Therapy: ?Robotic assisted laparoscopic left radical nephrectomy completed on September 27, 2021.  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  ? ? ?Current therapy: Pembrolizumab 200 mg started on January 09, 2022.  She is here for cycle 2 of therapy. ? ?Interim History: Donna Andersen returns today for a follow-up visit.  Since her last visit, she reports no major changes in her health.  She tolerated the first cycle of pembrolizumab without any complications.  She did report occasional pruritus but not diffuse or constant.  She denies any nausea, vomiting or abdominal pain.  She denies any diffuse skin rash.  She denies any pulmonary toxicity including shortness of breath or difficulty breathing.  She denies any hospitalizations or illnesses. ? ?  ? ?Medications: I have reviewed the patient's current medications.  ?Current Outpatient Medications  ?Medication Sig Dispense Refill  ? calcium carbonate (OS-CAL - DOSED IN MG OF ELEMENTAL CALCIUM) 1250 MG tablet Take 1 tablet by mouth every 14 (fourteen) days.    ? cephALEXin (KEFLEX) 500 MG capsule Take 1 capsule (500 mg total) by mouth 2 (two) times daily. (Patient not taking: Reported on 01/09/2022) 10 capsule 0  ? Cholecalciferol (VITAMIN D3) 2400 UNIT/ML LIQD Take 2,400 Units by mouth daily.    ? docusate sodium (COLACE) 100 MG capsule Take 1 capsule (100 mg total) by mouth 2 (two) times daily as needed for mild constipation. 30 capsule 0  ? EPINEPHrine 0.3 mg/0.3 mL IJ SOAJ injection Inject 0.3 mg into the muscle as  needed for anaphylaxis.    ? olmesartan (BENICAR) 40 MG tablet TAKE 1 TABLET BY MOUTH EVERY DAY (Patient taking differently: Take 40 mg by mouth every evening.) 90 tablet 3  ? prochlorperazine (COMPAZINE) 10 MG tablet Take 1 tablet (10 mg total) by mouth every 6 (six) hours as needed for nausea or vomiting. 30 tablet 0  ? rosuvastatin (CRESTOR) 5 MG tablet TAKE '5MG'$  BY MOUTH EVERY OTHER DAY 45 tablet 3  ? SYMBICORT 80-4.5 MCG/ACT inhaler Inhale 2 puffs into the lungs daily as needed (allergies).    ? ?No current facility-administered medications for this visit.  ? ? ? ?Allergies:  ?Allergies  ?Allergen Reactions  ? Bee Venom Hives  ? Atorvastatin   ?  REACTION: dental pain, myalgias, and headache  ? Lisinopril   ?  REACTION: cough  ? Losartan Potassium   ?  REACTION: drowsiness  ? Sulfonamide Derivatives   ?  REACTION: Rash, Hives, Asthma  ? Latex Itching and Rash  ?   scars  ? Other Rash and Hives  ?  shrimp. shrimp - Anaphylaxis (swelling & hives) ?Tape. Tape - scars, itching from latex tape  ? ? ? ? ?Physical Exam: ?Blood pressure (!) 156/78, pulse 83, temperature 97.9 ?F (36.6 ?C), temperature source Temporal, resp. rate 17, height 5' (1.524 m), weight 142 lb 1.6 oz (64.5 kg), SpO2 100 %. ? ?ECOG: 0 ? ? ? ?General appearance: Comfortable appearing without any discomfort ?Head: Normocephalic without any trauma ?Oropharynx: Mucous membranes are moist and pink without any thrush or ulcers. ?Eyes:  Pupils are equal and round reactive to light. ?Lymph nodes: No cervical, supraclavicular, inguinal or axillary lymphadenopathy.   ?Heart:regular rate and rhythm.  S1 and S2 without leg edema. ?Lung: Clear without any rhonchi or wheezes.  No dullness to percussion. ?Abdomin: Soft, nontender, nondistended with good bowel sounds.  No hepatosplenomegaly. ?Musculoskeletal: No joint deformity or effusion.  Full range of motion noted. ?Neurological: No deficits noted on motor, sensory and deep tendon reflex exam. ?Skin: No  petechial rash or dryness.  Appeared moist.  ? ? ? ?Lab Results: ?Lab Results  ?Component Value Date  ? WBC 10.1 01/09/2022  ? HGB 12.4 01/09/2022  ? HCT 36.5 01/09/2022  ? MCV 91.7 01/09/2022  ? PLT 284 01/09/2022  ? ?  Chemistry   ?   ?Component Value Date/Time  ? NA 137 01/09/2022 1247  ? NA 136 (A) 11/17/2021 0000  ? K 4.7 01/09/2022 1247  ? CL 105 01/09/2022 1247  ? CO2 24 01/09/2022 1247  ? BUN 24 (H) 01/09/2022 1247  ? BUN 24 (A) 11/17/2021 0000  ? CREATININE 1.12 (H) 01/09/2022 1247  ? CREATININE 0.79 10/10/2020 1147  ? GLU 108 11/17/2021 0000  ?    ?Component Value Date/Time  ? CALCIUM 9.2 01/09/2022 1247  ? CALCIUM 9.4 01/26/2013 0241  ? ALKPHOS 72 01/09/2022 1247  ? AST 17 01/09/2022 1247  ? ALT 15 01/09/2022 1247  ? BILITOT 0.4 01/09/2022 1247  ?  ? ? ? ? ?Impression and Plan: ? ? ? ?81 year old woman with: ? ?1.  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   ? ?The natural course of this disease was reviewed at this time and treatment choices were discussed.  She continues to tolerate Pembrolizumab without any complications and she is agreeable to continue.  Risks and benefits of completing 1 year of therapy were reviewed and she is agreeable for the time being.  She understands that this might change in the future if she develops any future toxicities. ? ?2.  IV access: No complications related to peripheral veins.  Port-A-Cath option has been deferred. ? ? ?3.  Antiemetics: No nausea or vomiting reported at this time.  Compazine is available to her. ? ?4.  Autoimmune complications surveillance: She has not experienced any complications.  I educated her about potential thyroid disease, pneumonitis, colitis and dermatitis. ?  ?5.  Follow-up: In 3 weeks for the next cycle of therapy. ?  ?30  minutes were spent on this encounter.  The time was dedicated to reviewing laboratory data, disease status update, treatment choices and  future plan of care discussion. ?  ?  ? ?Zola Button, MD ?3/28/20239:02 AM ? ?

## 2022-02-05 DIAGNOSIS — J301 Allergic rhinitis due to pollen: Secondary | ICD-10-CM | POA: Diagnosis not present

## 2022-02-05 DIAGNOSIS — T63451D Toxic effect of venom of hornets, accidental (unintentional), subsequent encounter: Secondary | ICD-10-CM | POA: Diagnosis not present

## 2022-02-05 DIAGNOSIS — J3089 Other allergic rhinitis: Secondary | ICD-10-CM | POA: Diagnosis not present

## 2022-02-05 DIAGNOSIS — J3081 Allergic rhinitis due to animal (cat) (dog) hair and dander: Secondary | ICD-10-CM | POA: Diagnosis not present

## 2022-02-10 ENCOUNTER — Other Ambulatory Visit: Payer: Self-pay | Admitting: Family Medicine

## 2022-02-12 DIAGNOSIS — C642 Malignant neoplasm of left kidney, except renal pelvis: Secondary | ICD-10-CM | POA: Diagnosis not present

## 2022-02-14 ENCOUNTER — Ambulatory Visit: Payer: Medicare HMO | Admitting: Dermatology

## 2022-02-19 DIAGNOSIS — C642 Malignant neoplasm of left kidney, except renal pelvis: Secondary | ICD-10-CM | POA: Diagnosis not present

## 2022-02-20 ENCOUNTER — Inpatient Hospital Stay: Payer: Medicare HMO

## 2022-02-20 ENCOUNTER — Other Ambulatory Visit: Payer: Self-pay

## 2022-02-20 ENCOUNTER — Inpatient Hospital Stay: Payer: Medicare HMO | Attending: Oncology

## 2022-02-20 ENCOUNTER — Inpatient Hospital Stay (HOSPITAL_BASED_OUTPATIENT_CLINIC_OR_DEPARTMENT_OTHER): Payer: Medicare HMO | Admitting: Oncology

## 2022-02-20 ENCOUNTER — Encounter: Payer: Self-pay | Admitting: Oncology

## 2022-02-20 VITALS — BP 140/73 | HR 87 | Temp 98.1°F | Resp 16 | Ht 60.0 in | Wt 142.4 lb

## 2022-02-20 DIAGNOSIS — C642 Malignant neoplasm of left kidney, except renal pelvis: Secondary | ICD-10-CM | POA: Insufficient documentation

## 2022-02-20 DIAGNOSIS — Z905 Acquired absence of kidney: Secondary | ICD-10-CM | POA: Insufficient documentation

## 2022-02-20 DIAGNOSIS — Z5111 Encounter for antineoplastic chemotherapy: Secondary | ICD-10-CM | POA: Insufficient documentation

## 2022-02-20 DIAGNOSIS — L309 Dermatitis, unspecified: Secondary | ICD-10-CM | POA: Insufficient documentation

## 2022-02-20 DIAGNOSIS — Z79899 Other long term (current) drug therapy: Secondary | ICD-10-CM | POA: Insufficient documentation

## 2022-02-20 LAB — CBC WITH DIFFERENTIAL (CANCER CENTER ONLY)
Abs Immature Granulocytes: 0.04 10*3/uL (ref 0.00–0.07)
Basophils Absolute: 0.1 10*3/uL (ref 0.0–0.1)
Basophils Relative: 1 %
Eosinophils Absolute: 0.2 10*3/uL (ref 0.0–0.5)
Eosinophils Relative: 2 %
HCT: 38.1 % (ref 36.0–46.0)
Hemoglobin: 12.5 g/dL (ref 12.0–15.0)
Immature Granulocytes: 1 %
Lymphocytes Relative: 18 %
Lymphs Abs: 1.5 10*3/uL (ref 0.7–4.0)
MCH: 30.3 pg (ref 26.0–34.0)
MCHC: 32.8 g/dL (ref 30.0–36.0)
MCV: 92.5 fL (ref 80.0–100.0)
Monocytes Absolute: 0.5 10*3/uL (ref 0.1–1.0)
Monocytes Relative: 6 %
Neutro Abs: 6.1 10*3/uL (ref 1.7–7.7)
Neutrophils Relative %: 72 %
Platelet Count: 294 10*3/uL (ref 150–400)
RBC: 4.12 MIL/uL (ref 3.87–5.11)
RDW: 12.6 % (ref 11.5–15.5)
WBC Count: 8.4 10*3/uL (ref 4.0–10.5)
nRBC: 0 % (ref 0.0–0.2)

## 2022-02-20 LAB — CMP (CANCER CENTER ONLY)
ALT: 14 U/L (ref 0–44)
AST: 12 U/L — ABNORMAL LOW (ref 15–41)
Albumin: 4.1 g/dL (ref 3.5–5.0)
Alkaline Phosphatase: 77 U/L (ref 38–126)
Anion gap: 8 (ref 5–15)
BUN: 23 mg/dL (ref 8–23)
CO2: 23 mmol/L (ref 22–32)
Calcium: 9 mg/dL (ref 8.9–10.3)
Chloride: 107 mmol/L (ref 98–111)
Creatinine: 1.16 mg/dL — ABNORMAL HIGH (ref 0.44–1.00)
GFR, Estimated: 48 mL/min — ABNORMAL LOW (ref >60)
Glucose, Bld: 83 mg/dL (ref 70–99)
Potassium: 4 mmol/L (ref 3.5–5.1)
Sodium: 138 mmol/L (ref 135–145)
Total Bilirubin: 0.4 mg/dL (ref 0.3–1.2)
Total Protein: 6.8 g/dL (ref 6.5–8.1)

## 2022-02-20 LAB — TSH: TSH: 1.927 u[IU]/mL (ref 0.350–4.500)

## 2022-02-20 MED ORDER — SODIUM CHLORIDE 0.9 % IV SOLN: Freq: Once | INTRAVENOUS | Status: AC

## 2022-02-20 MED ORDER — SODIUM CHLORIDE 0.9 % IV SOLN
200.0000 mg | Freq: Once | INTRAVENOUS | Status: AC
  Administered 2022-02-20: 200 mg via INTRAVENOUS
  Filled 2022-02-20: qty 200

## 2022-02-20 NOTE — Progress Notes (Signed)
Hematology and Oncology Follow Up Visit ? ?Donna Andersen ?[id number] ?1941/06/04 81 y.o. ?02/20/2022 8:07 AM ?Donna Andersen, Donna Fetch, MD  ? 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   ? ? ?Prior Therapy: ?Robotic assisted laparoscopic left radical nephrectomy completed on September 27, 2021.  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  ? ? ?Current therapy: Pembrolizumab 200 mg started on January 09, 2022.  She is here for cycle 3 of therapy. ? ?Interim History: Donna Andersen presents today for repeat evaluation.  Since the last visit, she reports no major complaints in her health.  She continues to tolerate Pembrolizumab without any major complaints.  She does report occasional pruritus and very faint limited rash.  She applies hydrocortisone cream with success.  She denies excessive fatigue, tiredness or weakness.  She denies any GI toxicities.  She denies any respiratory complaints. ? ?  ? ?Medications: Updated on review. ?Current Outpatient Medications  ?Medication Sig Dispense Refill  ? calcium carbonate (OS-CAL - DOSED IN MG OF ELEMENTAL CALCIUM) 1250 MG tablet Take 1 tablet by mouth every 14 (fourteen) days.    ? cephALEXin (KEFLEX) 500 MG capsule Take 1 capsule (500 mg total) by mouth 2 (two) times daily. (Patient not taking: Reported on 01/09/2022) 10 capsule 0  ? Cholecalciferol (VITAMIN D3) 2400 UNIT/ML LIQD Take 2,400 Units by mouth daily.    ? docusate sodium (COLACE) 100 MG capsule Take 1 capsule (100 mg total) by mouth 2 (two) times daily as needed for mild constipation. 30 capsule 0  ? EPINEPHrine 0.3 mg/0.3 mL IJ SOAJ injection Inject 0.3 mg into the muscle as needed for anaphylaxis.    ? olmesartan (BENICAR) 40 MG tablet TAKE 1 TABLET BY MOUTH EVERY DAY 90 tablet 3  ?  prochlorperazine (COMPAZINE) 10 MG tablet Take 1 tablet (10 mg total) by mouth every 6 (six) hours as needed for nausea or vomiting. 30 tablet 0  ? rosuvastatin (CRESTOR) 5 MG tablet TAKE '5MG'$  BY MOUTH EVERY OTHER DAY 45 tablet 3  ? SYMBICORT 80-4.5 MCG/ACT inhaler Inhale 2 puffs into the lungs daily as needed (allergies).    ? ?No current facility-administered medications for this visit.  ? ? ? ?Allergies:  ?Allergies  ?Allergen Reactions  ? Bee Venom Hives  ? Atorvastatin   ?  REACTION: dental pain, myalgias, and headache  ? Lisinopril   ?  REACTION: cough  ? Losartan Potassium   ?  REACTION: drowsiness  ? Sulfonamide Derivatives   ?  REACTION: Rash, Hives, Asthma  ? Latex Itching and Rash  ?   scars  ? Other Rash and Hives  ?  shrimp. shrimp - Anaphylaxis (swelling & hives) ?Tape. Tape - scars, itching from latex tape  ? ? ? ? ?Physical Exam: ? ?Blood pressure 140/73, pulse 87, temperature 98.1 ?F (36.7 ?C), temperature source Temporal, resp. rate 16, height 5' (1.524 m), weight 142 lb 6.4 oz (64.6 kg), SpO2 100 %. ? ?ECOG: 0 ? ? ?General appearance: Alert, awake without any distress. ?Head: Atraumatic without abnormalities ?Oropharynx: Without any thrush or ulcers. ?Eyes: No scleral icterus. ?Lymph nodes: No lymphadenopathy noted in the cervical, supraclavicular, or axillary nodes ?Heart:regular rate and rhythm, without any murmurs or gallops.   ?Lung: Clear to auscultation without any rhonchi, wheezes or dullness to percussion. ?Abdomin: Soft,  nontender without any shifting dullness or ascites. ?Musculoskeletal: No clubbing or cyanosis. ?Neurological: No motor or sensory deficits. ?Skin: No rashes or lesions. ? ? ? ? ?Lab Results: ?Lab Results  ?Component Value Date  ? WBC 7.7 01/30/2022  ? HGB 12.8 01/30/2022  ? HCT 39.0 01/30/2022  ? MCV 93.1 01/30/2022  ? PLT 273 01/30/2022  ? ?  Chemistry   ?   ?Component Value Date/Time  ? NA 139 01/30/2022 0855  ? NA 136 (A) 11/17/2021 0000  ? K 4.2 01/30/2022 0855  ? CL  106 01/30/2022 0855  ? CO2 25 01/30/2022 0855  ? BUN 22 01/30/2022 0855  ? BUN 24 (A) 11/17/2021 0000  ? CREATININE 1.21 (H) 01/30/2022 0855  ? CREATININE 0.79 10/10/2020 1147  ? GLU 108 11/17/2021 0000  ?    ?Component Value Date/Time  ? CALCIUM 9.5 01/30/2022 0855  ? CALCIUM 9.4 01/26/2013 0241  ? ALKPHOS 78 01/30/2022 0855  ? AST 15 01/30/2022 0855  ? ALT 15 01/30/2022 0855  ? BILITOT 0.5 01/30/2022 0855  ?  ? ? ? ? ?Impression and Plan: ? ? ? ?81 year old woman with: ? ?1.  Kidney cancer diagnosed in November 2022.  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 ? ?She is currently on Pembrolizumab without any major complications.  Risks and benefits of continuing this treatment were discussed at this time.  Complications including autoimmune concerns, GI toxicity as well as dermatological issues.  Plan is to complete 17 cycles of therapy.  She is agreeable to proceed. ? ?2.  IV access: Peripheral veins continues to be in use at this time. ? ? ?3.  Antiemetics: Compazine is available to her without any issues. ? ?4.  Autoimmune complications surveillance: Complications such as pneumonitis, colitis, hepatitis among others were reiterated.  She has mild dermatitis which is manageable at this time. ?  ?5.  Follow-up: She will return in 3 weeks for the next cycle of therapy. ?  ?30  minutes were dedicated to this encounter.  The time was spent on reviewing laboratory data, disease status update and outlining future plan of care discussion. ?  ?  ? ?Donna Button, MD ?4/18/20238:07 AM ? ?

## 2022-02-20 NOTE — Patient Instructions (Signed)
[facility] MEDICAL ONCOLOG[facility]   ?Discharge Instructions: ?Thank you for choosing [facility] to provide your oncology and hematology care.  ? ?If you have a lab appointment with the Cancer Center, please go directly to the Cancer Center and check in at the registration area. ?  ?Wear comfortable clothing and clothing appropriate for easy access to any Portacath or PICC line.  ? ?We strive to give you quality time with your provider. You may need to reschedule your appointment if you arrive late (15 or more minutes).  Arriving late affects you and other patients whose appointments are after yours.  Also, if you miss three or more appointments without notifying the office, you may be dismissed from the clinic at the provider?s discretion.    ?  ?For prescription refill requests, have your pharmacy contact our office and allow 72 hours for refills to be completed.   ? ?Today you received the following chemotherapy and/or immunotherapy agents: pembrolizumab    ?  ?To help prevent nausea and vomiting after your treatment, we encourage you to take your nausea medication as directed. ? ?BELOW ARE SYMPTOMS THAT SHOULD BE REPORTED IMMEDIATELY: ?*FEVER GREATER THAN 100.4 F (38 ?C) OR HIGHER ?*CHILLS OR SWEATING ?*NAUSEA AND VOMITING THAT IS NOT CONTROLLED WITH YOUR NAUSEA MEDICATION ?*UNUSUAL SHORTNESS OF BREATH ?*UNUSUAL BRUISING OR BLEEDING ?*URINARY PROBLEMS (pain or burning when urinating, or frequent urination) ?*BOWEL PROBLEMS (unusual diarrhea, constipation, pain near the anus) ?TENDERNESS IN MOUTH AND THROAT WITH OR WITHOUT PRESENCE OF ULCERS (sore throat, sores in mouth, or a toothache) ?UNUSUAL RASH, SWELLING OR PAIN  ?UNUSUAL VAGINAL DISCHARGE OR ITCHING  ? ?Items with * indicate a potential emergency and should be followed up as soon as possible or go to the Emergency Department if any problems should occur. ? ?Please show the CHEMOTHERAPY ALERT CARD or IMMUNOTHERAPY ALERT CARD at  check-in to the Emergency Department and triage nurse. ? ?Should you have questions after your visit or need to cancel or reschedule your appointment, please contact [facility]  Dept: [phone]  and follow the prompts.  Office hours are 8:00 a.m. to 4:30 p.m. Monday - Friday. Please note that voicemails left after 4:00 p.m. may not be returned until the following business day.  We are closed weekends and major holidays. You have access to a nurse at all times for urgent questions. Please call the main number to the clinic Dept: [phone] and follow the prompts. ? ? ?For any non-urgent questions, you may also contact your provider using MyChart. We now offer e-Visits for anyone 18 and older to request care online for non-urgent symptoms. For details visit [email or web address]. ?  ?Also download the MyChart app! Go to the app store, search "MyChart", open the app, select [facility], and log in with your MyChart username and password. ? ?Due to Covid, a mask is required upon entering the hospital/clinic. If you do not have a mask, one will be given to you upon arrival. For doctor visits, patients may have 1 support person aged 18 or older with them. For treatment visits, patients cannot have anyone with them due to current Covid guidelines and our immunocompromised population.  ? ?

## 2022-02-26 DIAGNOSIS — M899 Disorder of bone, unspecified: Secondary | ICD-10-CM | POA: Diagnosis not present

## 2022-02-26 DIAGNOSIS — M25562 Pain in left knee: Secondary | ICD-10-CM | POA: Diagnosis not present

## 2022-02-26 DIAGNOSIS — M170 Bilateral primary osteoarthritis of knee: Secondary | ICD-10-CM | POA: Diagnosis not present

## 2022-02-26 DIAGNOSIS — M7662 Achilles tendinitis, left leg: Secondary | ICD-10-CM | POA: Diagnosis not present

## 2022-03-01 DIAGNOSIS — N281 Cyst of kidney, acquired: Secondary | ICD-10-CM | POA: Diagnosis not present

## 2022-03-01 DIAGNOSIS — R911 Solitary pulmonary nodule: Secondary | ICD-10-CM | POA: Diagnosis not present

## 2022-03-01 DIAGNOSIS — C642 Malignant neoplasm of left kidney, except renal pelvis: Secondary | ICD-10-CM | POA: Diagnosis not present

## 2022-03-07 ENCOUNTER — Telehealth: Payer: Self-pay | Admitting: Oncology

## 2022-03-07 NOTE — Telephone Encounter (Signed)
Called patient regarding upcoming May appointments, left a voicemail. 

## 2022-03-12 ENCOUNTER — Ambulatory Visit (INDEPENDENT_AMBULATORY_CARE_PROVIDER_SITE_OTHER): Payer: Medicare HMO | Admitting: Family Medicine

## 2022-03-12 ENCOUNTER — Encounter: Payer: Self-pay | Admitting: *Deleted

## 2022-03-12 ENCOUNTER — Encounter: Payer: Self-pay | Admitting: Family Medicine

## 2022-03-12 VITALS — BP 132/80 | HR 80 | Temp 97.8°F | Ht 60.0 in | Wt 140.1 lb

## 2022-03-12 DIAGNOSIS — I10 Essential (primary) hypertension: Secondary | ICD-10-CM

## 2022-03-12 DIAGNOSIS — M8580 Other specified disorders of bone density and structure, unspecified site: Secondary | ICD-10-CM

## 2022-03-12 DIAGNOSIS — Z0000 Encounter for general adult medical examination without abnormal findings: Secondary | ICD-10-CM | POA: Diagnosis not present

## 2022-03-12 DIAGNOSIS — E782 Mixed hyperlipidemia: Secondary | ICD-10-CM

## 2022-03-12 DIAGNOSIS — C642 Malignant neoplasm of left kidney, except renal pelvis: Secondary | ICD-10-CM | POA: Diagnosis not present

## 2022-03-12 DIAGNOSIS — D126 Benign neoplasm of colon, unspecified: Secondary | ICD-10-CM

## 2022-03-12 DIAGNOSIS — N1831 Chronic kidney disease, stage 3a: Secondary | ICD-10-CM | POA: Insufficient documentation

## 2022-03-12 LAB — LIPID PANEL
Cholesterol: 213 mg/dL — ABNORMAL HIGH (ref 0–200)
HDL: 60.5 mg/dL (ref >39.00)
LDL Cholesterol: 116 mg/dL — ABNORMAL HIGH (ref 0–99)
NonHDL: 152.94
Total CHOL/HDL Ratio: 4
Triglycerides: 185 mg/dL — ABNORMAL HIGH (ref 0.0–149.0)
VLDL: 37 mg/dL (ref 0.0–40.0)

## 2022-03-12 NOTE — Patient Instructions (Signed)

## 2022-03-12 NOTE — Progress Notes (Signed)
?Subjective  ?Chief Complaint  ?Patient presents with  ? Annual Exam  ?  Fasting ?  ? ? ?HPI: Donna Andersen is a 81 y.o. female who presents to [facility] today for a Female Wellness Visit. She also has the concerns and/or needs as listed above in the chief complaint. These will be addressed in addition to the Health Maintenance Visit.  ? ?Wellness Visit: annual visit with health maintenance review and exam without Pap ? ?Health maintenance: Mammogram current, normal in January.  Due for bone density but would like to defer due to currently having chemotherapy every 3 weeks.  Colorectal cancer screening is current.  No further colonoscopies recommended.  She remains active.  Played 6 games of pickleball just this morning.  She is happy.  No mood problems or cognitive concerns. ?She declines Shingrix vaccinations. 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  Tolerating chemotherapy well.  He has rash and mild fatigue after treatment as a side effect but otherwise feeling pretty okay.  Treatment will last 6 to 12 months. ?Hypertension: Normotensive at home.  Brings in log.  Well-controlled 110-120 over 70s on average.  Tolerates blood pressure medicines well.  Had recent renal function test and electrolytes ordered at oncology.  I reviewed them.  They were stable. ?Hyperlipidemia on Crestor 5.  Tolerating well.  She is fasting for recheck today.  He has normal liver function test. ?Osteopenia: Due for bone density but defers.  See above ? ?Assessment  ?1. Annual physical exam   ?2. Clear cell renal cell carcinoma, left (HCC)   ?3. White coat syndrome with diagnosis of hypertension   ?4. Mixed hyperlipidemia   ?5. Osteopenia, unspecified location   ?6. Tubular adenoma of colon   ?7. 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 ?  ?Plan  ?Female Wellness  Visit: ?Age appropriate Health Maintenance and Prevention measures were discussed with patient. Included topics are cancer screening recommendations, ways to keep healthy (see AVS) including dietary and exercise recommendations, regular eye and dental care, use of seat belts, and avoidance of moderate alcohol use and tobacco use.  ?BMI: discussed patient's BMI and encouraged positive lifestyle modifications to help get to or maintain a target BMI. ?HM needs and immunizations were addressed and ordered. See below for orders. See HM and immunization section for updates. ?Routine labs and screening tests ordered including cmp, cbc and lipids where appropriate. ?Discussed recommendations regarding Vit D and calcium supplementation (see AVS) ? ?Chronic disease management visit and/or acute problem visit: ?Clear-cell renal cell carcinoma: Continue chemotherapy.  We will follow along. ?Hypertension: Normotensive in office today.  Has whitecoat syndrome.  She will continue home monitoring.  Continue olmesartan 40 mg daily.  Renal function electrolytes are stable.  Mild chronic kidney disease. ?Hyperlipidemia: We will recheck fasting lipids today on Crestor 5 mg nightly.  Tolerating well. ?We will order bone density after chemotherapy completed. ? ?Follow up: 6 months to recheck blood pressure ?No orders of the defined types were placed in this encounter. ? ?No orders of the defined types were placed in this encounter. ? ?  ? ?Body mass index is 27.37 kg/m?. ?Wt Readings from Last 3 Encounters:  ?03/12/22 140 lb 2 oz (63.6 kg)  ?02/20/22 142 lb 6.4 oz (64.6 kg)  ?01/30/22 142 lb 1.6 oz (64.5 kg)  ? ? ? ?  Patient Active Problem List  ? Diagnosis Date Noted  ? Clear cell renal cell carcinoma, left ([place]) 09/07/2021  ?  Priority: High  ?  Left nephrectomy 09/2021. Dr. Lovena Neighbours, [facility] ? ?  ? Tubular adenoma of colon 09/07/2021  ?  Priority: High  ? White coat syndrome with diagnosis of hypertension 11/22/2008  ?   Priority: High  ? Mixed hyperlipidemia 07/21/2007  ?  Priority: High  ?  therapy is limited by multiple perceived drug intolerances ? ? ?  ? 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  Priority: Medium   ? Osteopenia 07/21/2007  ?  Priority: Medium   ?  Fosamax in 2013. Did not tolerate. BMD improved from increased weight bearing exercise on last dexa 04/2018. Does not want other medications. FrAX score was 4.3% for hip. Risks discussed. Okay with continuing calcium and vitamin D ? ?  ? Environmental allergies 06/27/2018  ?  Priority: Low  ?  Followed at [facility] allergy and asthma. Dr. Fredderick Phenix  ? ?  ? Fibrocystic breast changes, bilateral 10/10/2009  ?  Priority: Low  ? ACE inhibitor intolerance 06/20/2009  ?  Priority: Low  ? ?Health Maintenance  ?Topic Date Due  ? DEXA SCAN  04/14/2021  ? MAMMOGRAM  09/21/2021  ? COVID-19 Vaccine (6 - Booster for Pfizer series) 04/02/2022  ? INFLUENZA VACCINE  06/05/2022  ? Pneumonia Vaccine 14+ Years old  Completed  ? Hepatitis C Screening  Completed  ? HPV VACCINES  Aged Out  ? COLONOSCOPY (Pts 45-25yr Insurance coverage will need to be confirmed)  Discontinued  ? TETANUS/TDAP  Discontinued  ? Zoster Vaccines- Shingrix  Discontinued  ? ?Immunization History  ?Administered Date(s) Administered  ? PFIZER(Purple Top)SARS-COV-2 Vaccination 12/10/2019, 01/04/2020, 03/17/2020, 09/28/2020, 02/05/2022  ? Pneumococcal Conjugate-13 02/16/2015  ? Pneumococcal Polysaccharide-23 10/11/2008, 09/20/2016, 09/13/2017, 09/12/2018, 09/01/2019, 03/17/2020  ? Zoster, Live 02/22/2015  ? ?We updated and reviewed the patient's past history in detail and it is documented below. ?Allergies: ?Patient is allergic to bee venom, atorvastatin, lisinopril, losartan potassium, shrimp extract allergy skin test, sulfonamide derivatives, latex, and other. ?Past Medical History ?Patient  has a past medical history of Acute cystitis (09/26/2007), Allergy, Atypical nevus (11/17/2002), Atypical nevus  (11/17/2002), Atypical nevus (04/23/2006), Atypical nevus (04/23/2006), Atypical nevus (04/15/2012), Atypical nevus (08/25/2014), Atypical nevus (08/25/2014), Basal cell carcinoma (08/25/2014), COUGH DUE TO ACE INHIBITORS (06/20/2009), FIBROCYSTIC BREAST DISEASE (10/10/2009), GOITER, MULTINODULAR (10/11/2009), Headache(784.0) (06/08/2008), HEMATURIA UNSPECIFIED (10/05/2008), HYPERGLYCEMIA (09/26/2007), HYPERLIPIDEMIA (07/21/2007), HYPERTENSION (11/22/2008), HYPOTHYROIDISM (07/21/2007), INSOMNIA (06/08/2008), Melanoma in situ ([place] (03/07/2011), Melanoma in situ ([place] (05/07/2011), MYALGIA (10/23/2010), OSTEOPOROSIS (07/21/2007), Renal cell carcinoma ([place] (09/07/2021), 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 ?Past Surgical History ?Patient  has a past surgical history that includes Abdominal hysterectomy (1974); Thyroid surgery (2011); Breast lumpectomy (1976); Vaginal delivery; Colonoscopy; Polypectomy; and Robot assisted laparoscopic nephrectomy (Left, 09/27/2021). ?Family History: ?Patient family history includes Cancer in her mother and sister; Hypertension in her mother; Leukemia in her sister; Lymphoma in her sister; Ovarian cancer in her mother; Thyroid disease in her mother. ?Social History:  ?Patient  reports that she has never smoked. She has never used smokeless tobacco. She reports current alcohol use. She reports that she does not use drugs. ? ?Review of Systems: ?Constitutional: negative for fever or malaise ?Ophthalmic: negative for photophobia, double vision or loss of vision ?Cardiovascular: negative for chest pain, dyspnea on exertion, or new LE swelling ?Respiratory: negative for SOB or persistent cough ?Gastrointestinal: negative for abdominal  pain, change in bowel habits or melena ?Genitourinary: negative for dysuria or gross hematuria, no abnormal uterine bleeding or disharge ?Musculoskeletal: negative for new gait disturbance or muscular  weakness ?Integumentary: negative for new or persistent rashes, no breast lumps ?Neurological: negative for TIA or stroke symptoms ?Psychiatric: negative for SI or delusions ?Allergic/Immunologic: negative for hives ? ?Patient Care

## 2022-03-13 ENCOUNTER — Inpatient Hospital Stay: Payer: Medicare HMO | Attending: Oncology

## 2022-03-13 ENCOUNTER — Inpatient Hospital Stay: Payer: Medicare HMO | Admitting: Oncology

## 2022-03-13 ENCOUNTER — Inpatient Hospital Stay: Payer: Medicare HMO

## 2022-03-13 ENCOUNTER — Other Ambulatory Visit: Payer: Self-pay

## 2022-03-13 VITALS — BP 131/68 | HR 73 | Temp 98.2°F | Resp 17 | Ht 60.0 in | Wt 142.0 lb

## 2022-03-13 DIAGNOSIS — C642 Malignant neoplasm of left kidney, except renal pelvis: Secondary | ICD-10-CM

## 2022-03-13 DIAGNOSIS — Z79899 Other long term (current) drug therapy: Secondary | ICD-10-CM | POA: Diagnosis not present

## 2022-03-13 DIAGNOSIS — Z5111 Encounter for antineoplastic chemotherapy: Secondary | ICD-10-CM | POA: Insufficient documentation

## 2022-03-13 DIAGNOSIS — Z905 Acquired absence of kidney: Secondary | ICD-10-CM | POA: Diagnosis not present

## 2022-03-13 LAB — CMP (CANCER CENTER ONLY)
ALT: 16 U/L (ref 0–44)
AST: 17 U/L (ref 15–41)
Albumin: 4.2 g/dL (ref 3.5–5.0)
Alkaline Phosphatase: 76 U/L (ref 38–126)
Anion gap: 9 (ref 5–15)
BUN: 31 mg/dL — ABNORMAL HIGH (ref 8–23)
CO2: 22 mmol/L (ref 22–32)
Calcium: 9 mg/dL (ref 8.9–10.3)
Chloride: 107 mmol/L (ref 98–111)
Creatinine: 1.28 mg/dL — ABNORMAL HIGH (ref 0.44–1.00)
GFR, Estimated: 43 mL/min — ABNORMAL LOW (ref >60)
Glucose, Bld: 96 mg/dL (ref 70–99)
Potassium: 4.5 mmol/L (ref 3.5–5.1)
Sodium: 138 mmol/L (ref 135–145)
Total Bilirubin: 0.5 mg/dL (ref 0.3–1.2)
Total Protein: 7.1 g/dL (ref 6.5–8.1)

## 2022-03-13 LAB — CBC WITH DIFFERENTIAL (CANCER CENTER ONLY)
Abs Immature Granulocytes: 0.07 10*3/uL (ref 0.00–0.07)
Basophils Absolute: 0.1 10*3/uL (ref 0.0–0.1)
Basophils Relative: 1 %
Eosinophils Absolute: 0.2 10*3/uL (ref 0.0–0.5)
Eosinophils Relative: 2 %
HCT: 39.6 % (ref 36.0–46.0)
Hemoglobin: 12.9 g/dL (ref 12.0–15.0)
Immature Granulocytes: 1 %
Lymphocytes Relative: 17 %
Lymphs Abs: 1.7 10*3/uL (ref 0.7–4.0)
MCH: 30.2 pg (ref 26.0–34.0)
MCHC: 32.6 g/dL (ref 30.0–36.0)
MCV: 92.7 fL (ref 80.0–100.0)
Monocytes Absolute: 0.7 10*3/uL (ref 0.1–1.0)
Monocytes Relative: 7 %
Neutro Abs: 7.6 10*3/uL (ref 1.7–7.7)
Neutrophils Relative %: 72 %
Platelet Count: 289 10*3/uL (ref 150–400)
RBC: 4.27 MIL/uL (ref 3.87–5.11)
RDW: 12.6 % (ref 11.5–15.5)
WBC Count: 10.4 10*3/uL (ref 4.0–10.5)
nRBC: 0 % (ref 0.0–0.2)

## 2022-03-13 LAB — TSH: TSH: 2.393 u[IU]/mL (ref 0.350–4.500)

## 2022-03-13 MED ORDER — SODIUM CHLORIDE 0.9 % IV SOLN
200.0000 mg | Freq: Once | INTRAVENOUS | Status: AC
  Administered 2022-03-13: 200 mg via INTRAVENOUS
  Filled 2022-03-13: qty 200

## 2022-03-13 MED ORDER — SODIUM CHLORIDE 0.9 % IV SOLN: Freq: Once | INTRAVENOUS | Status: AC

## 2022-03-13 NOTE — Progress Notes (Signed)
Hematology and Oncology Follow Up Visit ? ?Donna Andersen ?[id number] ?1941/08/16 81 y.o. ?03/13/2022 11:55 AM ?[place]erton Lan, [name], MD  ? ?Principle Diagnosis: 66 year old woman with kidney cancer diagnosed in November 2022.  She was found to have T3a clear-cell with sarcomatoid features. ? ? ?Prior Therapy: ?Robotic assisted laparoscopic left radical nephrectomy completed on September 27, 2021.  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  ? ? ?Current therapy: Pembrolizumab 200 mg started on January 09, 2022.  She is here for cycle 4 of therapy. ? ?Interim History: [name] returns today for a follow-up visit.  Since the last visit, she continues to tolerate Pembrolizumab without any major complaints.  She denies any nausea vomiting or abdominal pain.  She denies any worsening skin rashes or lesions.  She denies any recent hospitalizations or illnesses.  She reports faint papular rash on her chest which is not pruritic. ? ?  ? ?Medications: Reviewed without changes. ?Current Outpatient Medications  ?Medication Sig Dispense Refill  ? calcium carbonate (OS-CAL - DOSED IN MG OF ELEMENTAL CALCIUM) 1250 MG tablet Take 1 tablet by mouth every 14 (fourteen) days.    ? Cholecalciferol (VITAMIN D3) 2400 UNIT/ML LIQD Take 2,400 Units by mouth daily.    ? docusate sodium (COLACE) 100 MG capsule Take 1 capsule (100 mg total) by mouth 2 (two) times daily as needed for mild constipation. 30 capsule 0  ? EPINEPHrine 0.3 mg/0.3 mL IJ SOAJ injection Inject 0.3 mg into the muscle as needed for anaphylaxis.    ? hydrocortisone 2.5 % cream Apply topically 2 (two) times daily as needed.    ? olmesartan (BENICAR) 40 MG tablet TAKE 1 TABLET BY MOUTH EVERY DAY 90 tablet 3  ? prochlorperazine (COMPAZINE) 10 MG tablet Take 1 tablet (10 mg total) by mouth every 6 (six) hours as needed for nausea or vomiting. 30 tablet 0  ?  rosuvastatin (CRESTOR) 5 MG tablet TAKE '5MG'$  BY MOUTH EVERY OTHER DAY 45 tablet 3  ? SYMBICORT 80-4.5 MCG/ACT inhaler Inhale 2 puffs into the lungs daily as needed (allergies).    ? ?No current facility-administered medications for this visit.  ? ? ? ?Allergies:  ?Allergies  ?Allergen Reactions  ? Bee Venom Hives  ? Atorvastatin   ?  REACTION: dental pain, myalgias, and headache  ? Lisinopril   ?  REACTION: cough  ? Losartan Potassium   ?  REACTION: drowsiness  ? Shrimp Extract Allergy Skin Test   ? Sulfonamide Derivatives   ?  REACTION: Rash, Hives, Asthma  ? Latex Itching, Rash and Other (See Comments)  ?   scars  ? Other Rash and Hives  ?  shrimp. shrimp - Anaphylaxis (swelling & hives) ?Tape. Tape - scars, itching from latex tape  ? ? ? ? ?Physical Exam: ? ? ? ?ECOG: 0 ? ? ? ?General appearance: Comfortable appearing without any discomfort ?Head: Normocephalic without any trauma ?Oropharynx: Mucous membranes are moist and pink without any thrush or ulcers. ?Eyes: Pupils are equal and round reactive to light. ?Lymph nodes: No cervical, supraclavicular, inguinal or axillary lymphadenopathy.   ?Heart:regular rate and rhythm.  S1 and S2 without leg edema. ?Lung: Clear without any rhonchi or wheezes.  No dullness to percussion. ?Abdomin: Soft, nontender, nondistended with good bowel sounds.  No hepatosplenomegaly. ?Musculoskeletal: No joint deformity or effusion.  Full range of motion noted. ?Neurological: No deficits noted on  motor, sensory and deep tendon reflex exam. ?Skin: No petechial rash or dryness.  Appeared moist.  ? ? ? ? ? ?Lab Results: ?Lab Results  ?Component Value Date  ? WBC 10.4 03/13/2022  ? HGB 12.9 03/13/2022  ? HCT 39.6 03/13/2022  ? MCV 92.7 03/13/2022  ? PLT 289 03/13/2022  ? ?  Chemistry   ?   ?Component Value Date/Time  ? NA 138 02/20/2022 0814  ? NA 136 (A) 11/17/2021 0000  ? K 4.0 02/20/2022 0814  ? CL 107 02/20/2022 0814  ? CO2 23 02/20/2022 0814  ? BUN 23 02/20/2022 0814  ? BUN 24 (A)  11/17/2021 0000  ? CREATININE 1.16 (H) 02/20/2022 9702  ? CREATININE 0.79 10/10/2020 1147  ? GLU 108 11/17/2021 0000  ?    ?Component Value Date/Time  ? CALCIUM 9.0 02/20/2022 0814  ? CALCIUM 9.4 01/26/2013 0241  ? ALKPHOS 77 02/20/2022 0814  ? AST 12 (L) 02/20/2022 6378  ? ALT 14 02/20/2022 0814  ? BILITOT 0.4 02/20/2022 0814  ?  ? ? ? ? ?Impression and Plan: ? ? ? ?81 year old woman with: ? ?1.  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 ? ?The natural course of her disease was reviewed at this time.  Imaging studies obtained in April 2023 showed no evidence of metastatic disease.  Risks and benefits of continuing adjuvant immunotherapy were reviewed.  Given her reasonable tolerance we agreed to continue.  Potential complications including autoimmune issues as well as dermatological toxicities were reiterated.   ? ?2.  IV access: No issues reported with peripheral veins.  Port-A-Cath option has been deferred. ? ? ?3.  Antiemetics: No nausea or vomiting reported.  Compazine is available to her. ? ?4.  Autoimmune complications surveillance: These issues including pneumonitis, colitis, hypophysitis and hepatitis were reviewed.  She has not experienced any continue to monitor. ?  ?5.  Follow-up: In 3 weeks for repeat follow-up. ?  ?30  minutes were spent on this visit.  The time was dedicated to reviewing laboratory data, disease status update and future plan of care discussion. ?  ?  ? ?Zola Button, MD ?5/9/202311:55 AM ? ?

## 2022-03-13 NOTE — Patient Instructions (Signed)
[facility]  Discharge Instructions: °Thank you for choosing [facility] to provide your oncology and hematology care.  ° °If you have a lab appointment with the Cancer Center, please go directly to the Cancer Center and check in at the registration area. °  °Wear comfortable clothing and clothing appropriate for easy access to any Portacath or PICC line.  ° °We strive to give you quality time with your provider. You may need to reschedule your appointment if you arrive late (15 or more minutes).  Arriving late affects you and other patients whose appointments are after yours.  Also, if you miss three or more appointments without notifying the office, you may be dismissed from the clinic at the provider’s discretion.    °  °For prescription refill requests, have your pharmacy contact our office and allow 72 hours for refills to be completed.   ° °Today you received the following chemotherapy and/or immunotherapy agent: Pembrolizumab (Keytruda) °  °To help prevent nausea and vomiting after your treatment, we encourage you to take your nausea medication as directed. ° °BELOW ARE SYMPTOMS THAT SHOULD BE REPORTED IMMEDIATELY: °*FEVER GREATER THAN 100.4 F (38 °C) OR HIGHER °*CHILLS OR SWEATING °*NAUSEA AND VOMITING THAT IS NOT CONTROLLED WITH YOUR NAUSEA MEDICATION °*UNUSUAL SHORTNESS OF BREATH °*UNUSUAL BRUISING OR BLEEDING °*URINARY PROBLEMS (pain or burning when urinating, or frequent urination) °*BOWEL PROBLEMS (unusual diarrhea, constipation, pain near the anus) °TENDERNESS IN MOUTH AND THROAT WITH OR WITHOUT PRESENCE OF ULCERS (sore throat, sores in mouth, or a toothache) °UNUSUAL RASH, SWELLING OR PAIN  °UNUSUAL VAGINAL DISCHARGE OR ITCHING  ° °Items with * indicate a potential emergency and should be followed up as soon as possible or go to the Emergency Department if any problems should occur. ° °Please show the CHEMOTHERAPY ALERT CARD or IMMUNOTHERAPY ALERT CARD at  check-in to the Emergency Department and triage nurse. ° °Should you have questions after your visit or need to cancel or reschedule your appointment, please contact [facility]  Dept: [phone]  and follow the prompts.  Office hours are 8:00 a.m. to 4:30 p.m. Monday - Friday. Please note that voicemails left after 4:00 p.m. may not be returned until the following business day.  We are closed weekends and major holidays. You have access to a nurse at all times for urgent questions. Please call the main number to the clinic Dept: [phone] and follow the prompts. ° ° °For any non-urgent questions, you may also contact your provider using MyChart. We now offer e-Visits for anyone 18 and older to request care online for non-urgent symptoms. For details visit [email or web address]. °  °Also download the MyChart app! Go to the app store, search "MyChart", open the app, select [facility], and log in with your MyChart username and password. ° °Due to Covid, a mask is required upon entering the hospital/clinic. If you do not have a mask, one will be given to you upon arrival. For doctor visits, patients may have 1 support person aged 18 or older with them. For treatment visits, patients cannot have anyone with them due to current Covid guidelines and our immunocompromised population.  ° °

## 2022-03-22 ENCOUNTER — Ambulatory Visit (INDEPENDENT_AMBULATORY_CARE_PROVIDER_SITE_OTHER): Payer: Medicare HMO

## 2022-03-22 DIAGNOSIS — Z0000 Encounter for general adult medical examination without abnormal findings: Secondary | ICD-10-CM | POA: Diagnosis not present

## 2022-03-22 NOTE — Progress Notes (Addendum)
Virtual Visit via Telephone Note  I connected with  Donna Andersen on 03/22/22 at  3:00 PM EDT by telephone and verified that I am speaking with the correct person using two identifiers.  Medicare Annual Wellness visit completed telephonically due to Covid-19 pandemic.   Persons participating in this call: This Health Coach and this patient.   Location: Patient: home Provider: office   I discussed the limitations, risks, security and privacy concerns of performing an evaluation and management service by telephone and the availability of in person appointments. The patient expressed understanding and agreed to proceed.  Unable to perform video visit due to video visit attempted and failed and/or patient does not have video capability.   Some vital signs may be absent or patient reported.   Willette Brace, LPN   Subjective:   Donna Andersen is a 81 y.o. female who presents for Medicare Annual (Subsequent) preventive examination.  Review of Systems     Cardiac Risk Factors include: advanced age (>90mn, >>27women);dyslipidemia;hypertension     Objective:    There were no vitals filed for this visit. There is no height or weight on file to calculate BMI.     03/22/2022    3:06 PM 11/23/2021    1:48 PM 09/27/2021   11:04 AM 09/12/2021    8:15 AM 03/07/2018    8:31 AM 03/04/2017    8:08 AM 02/28/2016    8:58 AM  Advanced Directives  Does Patient Have a Medical Advance Directive? Yes Yes Yes Yes No No No  Type of [place]amed[place]of [place] will [place] will     Does patient want to make changes to medical advance directive?  No - Patient declined No - Patient declined      Copy of [place] Chart? Yes - validated most recent copy scanned in chart (See row information) Yes - validated most recent copy scanned in chart (See row  information) Yes - validated most recent copy scanned in chart (See row information)        Current Medications (verified) Outpatient Encounter Medications as of 03/22/2022  Medication Sig   calcium carbonate (OS-CAL - DOSED IN MG OF ELEMENTAL CALCIUM) 1250 MG tablet Take 1 tablet by mouth every 14 (fourteen) days.   Cholecalciferol (VITAMIN D3) 2400 UNIT/ML LIQD Take 2,400 Units by mouth daily.   docusate sodium (COLACE) 100 MG capsule Take 1 capsule (100 mg total) by mouth 2 (two) times daily as needed for mild constipation.   EPINEPHrine 0.3 mg/0.3 mL IJ SOAJ injection Inject 0.3 mg into the muscle as needed for anaphylaxis.   hydrocortisone 2.5 % cream Apply topically 2 (two) times daily as needed.   olmesartan (BENICAR) 40 MG tablet TAKE 1 TABLET BY MOUTH EVERY DAY   prochlorperazine (COMPAZINE) 10 MG tablet Take 1 tablet (10 mg total) by mouth every 6 (six) hours as needed for nausea or vomiting.   rosuvastatin (CRESTOR) 5 MG tablet TAKE '5MG'$  BY MOUTH EVERY OTHER DAY   SYMBICORT 80-4.5 MCG/ACT inhaler Inhale 2 puffs into the lungs daily as needed (allergies).   Pembrolizumab (KEYTRUDA IV)    No facility-administered encounter medications on file as of 03/22/2022.    Allergies (verified) Bee venom, Atorvastatin, Lisinopril, Losartan potassium, Shrimp extract allergy skin test, Sulfonamide derivatives, Latex, and Other   History: Past Medical History:  Diagnosis Date   Acute cystitis 09/26/2007  recurrent UTI's   Allergy    Atypical nevus 11/17/2002   Upper Center Back - Moderate   Atypical nevus 11/17/2002   Right Lower Abdomen - Moderate   Atypical nevus 04/23/2006   Left Upper Arm - Slight to Moderate   Atypical nevus 04/23/2006   Left Outer Lower Leg - Slight to Moderate   Atypical nevus 04/15/2012   Left Upper Buttock - Mild   Atypical nevus 08/25/2014   Right Inner Knee - Moderate   Atypical nevus 08/25/2014   Left Scapula - Severe   Basal cell carcinoma 08/25/2014    left axilla tx -curet and cautery   COUGH DUE TO ACE INHIBITORS 06/20/2009   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 Dr. 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   Right Buccal Cheek (in situ)([id number])   WEIGHT GAIN 06/08/2008   Past Surgical History:  Procedure Laterality Date   ABDOMINAL HYSTERECTOMY  1974   BREAST LUMPECTOMY  1976   benign   COLONOSCOPY     POLYPECTOMY     ROBOT ASSISTED LAPAROSCOPIC NEPHRECTOMY Left 09/27/2021   Procedure: XI ROBOTIC ASSISTED LAPAROSCOPIC RADICAL NEPHRECTOMY;  Surgeon: Ceasar Mons, MD;  Location: [facility];  Service: Urology;  Laterality: Left;   THYROID SURGERY  2011   left nodule removed   VAGINAL DELIVERY     x1   Family History  Problem Relation Age of Onset   Cancer Mother        Ovarian Cancer   Ovarian cancer Mother    Hypertension Mother    Thyroid disease Mother    Lymphoma Sister    Cancer Sister        Breast Cancer, Kidney Cancer   Leukemia Sister    Colon cancer Neg Hx    Rectal cancer Neg Hx    Stomach cancer Neg Hx    Social History   Socioeconomic History   Marital status: Married    Spouse name: Not on file   Number of children: Not on file   Years of education: Not on file   Highest education level: Not on file  Occupational History   Occupation: Homemaker    Employer: RETIRED  Tobacco Use   Smoking status: Never   Smokeless tobacco: Never  Vaping Use   Vaping Use: Never  used  Substance and Sexual Activity   Alcohol use: Yes    Alcohol/week: 0.0 standard drinks    Comment: socially-2 glasses a week   Drug use: Never   Sexual activity: Not on file  Other Topics Concern   Not on file  Social History Narrative   Does not work outside the home   Social Determinants of Health   Financial Resource Strain: Low Risk    Difficulty of Paying Living Expenses: Not hard at all  Food Insecurity: No Food Insecurity   Worried About Charity fundraiser in the Last Year: Never true   [place] in the Last  Year: Never true  Transportation Needs: No Transportation Needs   Lack of Transportation (Medical): No   Lack of Transportation (Non-Medical): No  Physical Activity: Sufficiently Active   Days of Exercise per Week: 5 days   Minutes of Exercise per Session: 120 min  Stress: No Stress Concern Present   Feeling of Stress : Not at all  Social Connections: Moderately Integrated   Frequency of Communication with Friends and Family: More than three times a week   Frequency of Social Gatherings with Friends and Family: More than three times a week   Attends Religious Services: More than 4 times per year   Active Member of [employer] or Organizations: No   Attends Music therapist: Never   Marital Status: Married    Tobacco Counseling Counseling given: Not Answered   Clinical Intake:  Pre-visit preparation completed: Yes  Pain : No/denies pain     BMI - recorded: 27.73 Nutritional Status: BMI 25 -29 Overweight Nutritional Risks: None Diabetes: No  How often do you need to have someone help you when you read instructions, pamphlets, or other written materials from your doctor or pharmacy?: 1 - Never  Diabetic?no  Interpreter Needed?: No  Information entered by :: Charlott Rakes, LPN   Activities of Daily Living    03/22/2022    3:07 PM 09/27/2021    6:45 PM  In your present state of health, do you have any difficulty performing  the following activities:  Hearing? 0   Vision? 0   Difficulty concentrating or making decisions? 0   Walking or climbing stairs? 0   Dressing or bathing? 0   Doing errands, shopping? 0 0  Preparing Food and eating ? N   Using the Toilet? N   In the past six months, have you accidently leaked urine? N   Do you have problems with loss of bowel control? N   Managing your Medications? N   Managing your Finances? N   Housekeeping or managing your Housekeeping? N     Patient Care Team: Leamon Arnt, MD as PCP - General (Family Medicine) Newton Pigg, MD (Obstetrics and Gynecology) Ninetta Lights, MD (Inactive) as Consulting Physician (Orthopedic Surgery) Harold Hedge, Darrick Grinder, MD as Consulting Physician (Allergy and Immunology) Lavonna Monarch, MD as Consulting Physician (Dermatology) Luberta Mutter, MD as Consulting Physician (Ophthalmology)  Indicate any recent Medical Services you may have received from other than [facility] providers in the past year (date may be approximate).     Assessment:   This is a routine wellness examination for [facility].  Hearing/Vision screen Hearing Screening - Comments:: Pt denies any hearing issues  Vision Screening - Comments:: Pt follows up with Dr Ellie Lunch for annual eye exams   Dietary issues and exercise activities discussed: Current Exercise Habits: Home exercise routine, Type of exercise: Other - see comments (pickle ball), Time (Minutes): > 60, Frequency (Times/Week): 5, Weekly Exercise (Minutes/Week): 0   Goals Addressed             This Visit's Progress    Patient Stated       None at this time        Depression Screen    03/22/2022    3:05 PM 09/07/2021    9:49 AM 04/08/2020   10:52 AM 04/08/2020   10:46 AM 09/28/2019   11:38 AM 04/02/2019   11:21 AM 06/27/2018    8:46 AM  PHQ 2/9 Scores  PHQ - 2 Score 0 0 0 0 0  0 0    Fall Risk    03/22/2022    3:06 PM 03/12/2022   12:54 PM 03/27/2021    8:50 AM 04/08/2020   10:51 AM  04/02/2019   11:19 AM  Fall Risk   Falls in the past year? 0 0 0 0 0  Number falls in past yr: 0 0   0  Injury with Fall? 0 0   0  Risk for fall due to : Impaired vision No Fall Risks No Fall Risks No Fall Risks   Follow up Falls prevention discussed Falls evaluation completed       FALL RISK PREVENTION PERTAINING TO THE HOME:  Any stairs in or around the home? Yes  If so, are there any without handrails? No  Home free of loose throw rugs in walkways, pet beds, electrical cords, etc? Yes  Adequate lighting in your home to reduce risk of falls? Yes   ASSISTIVE DEVICES UTILIZED TO PREVENT FALLS:  Life alert? No  Use of a cane, walker or w/c? No  Grab bars in the bathroom? Yes  Shower chair or bench in shower? No  Elevated toilet seat or a handicapped toilet? No   TIMED UP AND GO:  Was the test performed? No .   Cognitive Function:        03/22/2022    3:08 PM  6CIT Screen  What Year? 0 points  What month? 0 points  What time? 0 points  Count back from 20 0 points  Months in reverse 0 points  Repeat phrase 0 points  Total Score 0 points    Immunizations Immunization History  Administered Date(s) Administered   PFIZER(Purple Top)SARS-COV-2 Vaccination 12/10/2019, 01/04/2020, 03/17/2020, 09/28/2020, 02/05/2022   Pneumococcal Conjugate-13 02/16/2015   Pneumococcal Polysaccharide-23 10/11/2008, 09/20/2016, 09/13/2017, 09/12/2018, 09/01/2019, 03/17/2020   Zoster, Live 02/22/2015      Flu Vaccine status: Declined, Education has been provided regarding the importance of this vaccine but patient still declined. Advised may receive this vaccine at local pharmacy or Health Dept. Aware to provide a copy of the vaccination record if obtained from local pharmacy or Health Dept. Verbalized acceptance and understanding.  Pneumococcal vaccine status: Up to date  Covid-19 vaccine status: Completed vaccines  Qualifies for Shingles Vaccine? No   Zostavax completed No      Screening Tests Health Maintenance  Topic Date Due   DEXA SCAN  03/12/2023 (Originally 04/14/2021)   COVID-19 Vaccine (6 - Booster for Pfizer series) 04/02/2022   INFLUENZA VACCINE  06/05/2022   MAMMOGRAM  11/22/2022   Pneumonia Vaccine 77+ Years old  Completed   Hepatitis C Screening  Completed   HPV VACCINES  Aged Out   COLONOSCOPY (Pts 45-52yr Insurance coverage will need to be confirmed)  Discontinued   TETANUS/TDAP  Discontinued   Zoster Vaccines- Shingrix  Discontinued    Health Maintenance  There are no preventive care reminders to display for this patient.  Colorectal cancer screening: No longer required.   Mammogram status: Completed 11/22/21. Repeat every year  Bone Density status: Completed 04/14/18. Results reflect: Bone density results: OSTEOPENIA. Repeat every 3 years.   Additional Screening:  Hepatitis C Screening:  Completed 10/10/20  Vision Screening: Recommended annual ophthalmology exams for early detection of glaucoma and other disorders of the eye. Is the patient up to date with their annual eye exam?  Yes  Who is the provider or what is the name of the office in which the patient attends annual eye exams? Dr MEllie Lunch  If pt is not established with a provider, would they like to be referred to a provider to establish care? No .   Dental Screening: Recommended annual dental exams for proper oral hygiene  Community Resource Referral / Chronic Care Management: CRR required this visit?  No   CCM required this visit?  No      Plan:     I have personally reviewed and noted the following in the patient's chart:   Medical and social history Use of alcohol, tobacco or illicit drugs  Current medications and supplements including opioid prescriptions.  Functional ability and status Nutritional status Physical activity Advanced directives List of other physicians Hospitalizations, surgeries, and ER visits in previous 12 months Vitals Screenings to  include cognitive, depression, and falls Referrals and appointments  In addition, I have reviewed and discussed with patient certain preventive protocols, quality metrics, and best practice recommendations. A written personalized care plan for preventive services as well as general preventive health recommendations were provided to patient.     Willette Brace, LPN   3/61/2244   Nurse Notes: none

## 2022-03-22 NOTE — Patient Instructions (Addendum)
Donna Andersen , Thank you for taking time to come for your Medicare Wellness Visit. I appreciate your ongoing commitment to your health goals. Please review the following plan we discussed and let me know if I can assist you in the future.   Screening recommendations/referrals: Colonoscopy no longer required  Mammogram: done 11/22/21 repeat every year  Bone Density: done 04/14/18 repeat every 3 year  Recommended yearly ophthalmology/optometry visit for glaucoma screening and checkup Recommended yearly dental visit for hygiene and checkup  Vaccinations: Influenza vaccine: declined  Pneumococcal vaccine: Up to date Tdap vaccine: declined and discussed  Shingles vaccine: Shingrix discussed. Please contact your pharmacy for coverage information.    Covid-19:completed 2/4, 3/1, 5/13 & 09/28/20, 02/15/22  Advanced directives: copies in chart   Conditions/risks identified: none at this time   Next appointment: Follow up in one year for your annual wellness visit    Preventive Care 65 Years and Older, Female Preventive care refers to lifestyle choices and visits with your health care provider that can promote health and wellness. What does preventive care include? A yearly physical exam. This is also called an annual well check. Dental exams once or twice a year. Routine eye exams. Ask your health care provider how often you should have your eyes checked. Personal lifestyle choices, including: Daily care of your teeth and gums. Regular physical activity. Eating a healthy diet. Avoiding tobacco and drug use. Limiting alcohol use. Practicing safe sex. Taking low-dose aspirin every day. Taking vitamin and mineral supplements as recommended by your health care provider. What happens during an annual well check? The services and screenings done by your health care provider during your annual well check will depend on your age, overall health, lifestyle risk factors, and family history of  disease. Counseling  Your health care provider may ask you questions about your: Alcohol use. Tobacco use. Drug use. Emotional well-being. Home and relationship well-being. Sexual activity. Eating habits. History of falls. Memory and ability to understand (cognition). Work and work Statistician. Reproductive health. Screening  You may have the following tests or measurements: Height, weight, and BMI. Blood pressure. Lipid and cholesterol levels. These may be checked every 5 years, or more frequently if you are over 65 years old. Skin check. Lung cancer screening. You may have this screening every year starting at age 50 if you have a 30-pack-year history of smoking and currently smoke or have quit within the past 15 years. Fecal occult blood test (FOBT) of the stool. You may have this test every year starting at age 38. Flexible sigmoidoscopy or colonoscopy. You may have a sigmoidoscopy every 5 years or a colonoscopy every 10 years starting at age 58. Hepatitis C blood test. Hepatitis B blood test. Sexually transmitted disease (STD) testing. Diabetes screening. This is done by checking your blood sugar (glucose) after you have not eaten for a while (fasting). You may have this done every 1-3 years. Bone density scan. This is done to screen for osteoporosis. You may have this done starting at age 65. Mammogram. This may be done every 1-2 years. Talk to your health care provider about how often you should have regular mammograms. Talk with your health care provider about your test results, treatment options, and if necessary, the need for more tests. Vaccines  Your health care provider may recommend certain vaccines, such as: Influenza vaccine. This is recommended every year. Tetanus, diphtheria, and acellular pertussis (Tdap, Td) vaccine. You may need a Td booster every 10 years. Zoster vaccine.  You may need this after age 44. Pneumococcal 13-valent conjugate (PCV13) vaccine. One  dose is recommended after age 56. Pneumococcal polysaccharide (PPSV23) vaccine. One dose is recommended after age 12. Talk to your health care provider about which screenings and vaccines you need and how often you need them. This information is not intended to replace advice given to you by your health care provider. Make sure you discuss any questions you have with your health care provider. Document Released: 11/18/2015 Document Revised: 07/11/2016 Document Reviewed: 08/23/2015 Elsevier Interactive Patient Education  2017 [place] Prevention in the Home Falls can cause injuries. They can happen to people of all ages. There are many things you can do to make your home safe and to help prevent falls. What can I do on the outside of my home? Regularly fix the edges of walkways and driveways and fix any cracks. Remove anything that might make you trip as you walk through a door, such as a raised step or threshold. Trim any bushes or trees on the path to your home. Use bright outdoor lighting. Clear any walking paths of anything that might make someone trip, such as rocks or tools. Regularly check to see if handrails are loose or broken. Make sure that both sides of any steps have handrails. Any raised decks and porches should have guardrails on the edges. Have any leaves, snow, or ice cleared regularly. Use sand or salt on walking paths during winter. Clean up any spills in your garage right away. This includes oil or grease spills. What can I do in the bathroom? Use night lights. Install grab bars by the toilet and in the tub and shower. Do not use towel bars as grab bars. Use non-skid mats or decals in the tub or shower. If you need to sit down in the shower, use a plastic, non-slip stool. Keep the floor dry. Clean up any water that spills on the floor as soon as it happens. Remove soap buildup in the tub or shower regularly. Attach bath mats securely with double-sided  non-slip rug tape. Do not have throw rugs and other things on the floor that can make you trip. What can I do in the bedroom? Use night lights. Make sure that you have a light by your bed that is easy to reach. Do not use any sheets or blankets that are too big for your bed. They should not hang down onto the floor. Have a firm chair that has side arms. You can use this for support while you get dressed. Do not have throw rugs and other things on the floor that can make you trip. What can I do in the kitchen? Clean up any spills right away. Avoid walking on wet floors. Keep items that you use a lot in easy-to-reach places. If you need to reach something above you, use a strong step stool that has a grab bar. Keep electrical cords out of the way. Do not use floor polish or wax that makes floors slippery. If you must use wax, use non-skid floor wax. Do not have throw rugs and other things on the floor that can make you trip. What can I do with my stairs? Do not leave any items on the stairs. Make sure that there are handrails on both sides of the stairs and use them. Fix handrails that are broken or loose. Make sure that handrails are as long as the stairways. Check any carpeting to make sure that it is  firmly attached to the stairs. Fix any carpet that is loose or worn. Avoid having throw rugs at the top or bottom of the stairs. If you do have throw rugs, attach them to the floor with carpet tape. Make sure that you have a light switch at the top of the stairs and the bottom of the stairs. If you do not have them, ask someone to add them for you. What else can I do to help prevent falls? Wear shoes that: Do not have high heels. Have rubber bottoms. Are comfortable and fit you well. Are closed at the toe. Do not wear sandals. If you use a stepladder: Make sure that it is fully opened. Do not climb a closed stepladder. Make sure that both sides of the stepladder are locked into place. Ask  someone to hold it for you, if possible. Clearly mark and make sure that you can see: Any grab bars or handrails. First and last steps. Where the edge of each step is. Use tools that help you move around (mobility aids) if they are needed. These include: Canes. Walkers. Scooters. Crutches. Turn on the lights when you go into a dark area. Replace any light bulbs as soon as they burn out. Set up your furniture so you have a clear path. Avoid moving your furniture around. If any of your floors are uneven, fix them. If there are any pets around you, be aware of where they are. Review your medicines with your doctor. Some medicines can make you feel dizzy. This can increase your chance of falling. Ask your doctor what other things that you can do to help prevent falls. This information is not intended to replace advice given to you by your health care provider. Make sure you discuss any questions you have with your health care provider. Document Released: 08/18/2009 Document Revised: 03/29/2016 Document Reviewed: 11/26/2014 Elsevier Interactive Patient Education  2017 [employer].

## 2022-04-01 ENCOUNTER — Other Ambulatory Visit: Payer: Self-pay | Admitting: Family Medicine

## 2022-04-03 ENCOUNTER — Inpatient Hospital Stay (HOSPITAL_BASED_OUTPATIENT_CLINIC_OR_DEPARTMENT_OTHER): Payer: Medicare HMO | Admitting: Oncology

## 2022-04-03 ENCOUNTER — Inpatient Hospital Stay: Payer: Medicare HMO

## 2022-04-03 ENCOUNTER — Other Ambulatory Visit: Payer: Self-pay

## 2022-04-03 VITALS — BP 152/67 | HR 76 | Temp 97.7°F | Resp 18 | Ht 60.0 in | Wt 142.5 lb

## 2022-04-03 DIAGNOSIS — Z905 Acquired absence of kidney: Secondary | ICD-10-CM | POA: Diagnosis not present

## 2022-04-03 DIAGNOSIS — C642 Malignant neoplasm of left kidney, except renal pelvis: Secondary | ICD-10-CM

## 2022-04-03 DIAGNOSIS — Z79899 Other long term (current) drug therapy: Secondary | ICD-10-CM | POA: Diagnosis not present

## 2022-04-03 DIAGNOSIS — Z5111 Encounter for antineoplastic chemotherapy: Secondary | ICD-10-CM | POA: Diagnosis not present

## 2022-04-03 LAB — CBC WITH DIFFERENTIAL (CANCER CENTER ONLY)
Abs Immature Granulocytes: 0.06 10*3/uL (ref 0.00–0.07)
Basophils Absolute: 0.1 10*3/uL (ref 0.0–0.1)
Basophils Relative: 1 %
Eosinophils Absolute: 0.2 10*3/uL (ref 0.0–0.5)
Eosinophils Relative: 2 %
HCT: 38 % (ref 36.0–46.0)
Hemoglobin: 12.7 g/dL (ref 12.0–15.0)
Immature Granulocytes: 1 %
Lymphocytes Relative: 22 %
Lymphs Abs: 1.8 10*3/uL (ref 0.7–4.0)
MCH: 30.5 pg (ref 26.0–34.0)
MCHC: 33.4 g/dL (ref 30.0–36.0)
MCV: 91.3 fL (ref 80.0–100.0)
Monocytes Absolute: 0.5 10*3/uL (ref 0.1–1.0)
Monocytes Relative: 6 %
Neutro Abs: 5.5 10*3/uL (ref 1.7–7.7)
Neutrophils Relative %: 68 %
Platelet Count: 271 10*3/uL (ref 150–400)
RBC: 4.16 MIL/uL (ref 3.87–5.11)
RDW: 12.3 % (ref 11.5–15.5)
WBC Count: 8.1 10*3/uL (ref 4.0–10.5)
nRBC: 0 % (ref 0.0–0.2)

## 2022-04-03 LAB — CMP (CANCER CENTER ONLY)
ALT: 14 U/L (ref 0–44)
AST: 14 U/L — ABNORMAL LOW (ref 15–41)
Albumin: 4.3 g/dL (ref 3.5–5.0)
Alkaline Phosphatase: 87 U/L (ref 38–126)
Anion gap: 7 (ref 5–15)
BUN: 28 mg/dL — ABNORMAL HIGH (ref 8–23)
CO2: 25 mmol/L (ref 22–32)
Calcium: 9.6 mg/dL (ref 8.9–10.3)
Chloride: 105 mmol/L (ref 98–111)
Creatinine: 1.25 mg/dL — ABNORMAL HIGH (ref 0.44–1.00)
GFR, Estimated: 44 mL/min — ABNORMAL LOW (ref >60)
Glucose, Bld: 92 mg/dL (ref 70–99)
Potassium: 4.3 mmol/L (ref 3.5–5.1)
Sodium: 137 mmol/L (ref 135–145)
Total Bilirubin: 0.5 mg/dL (ref 0.3–1.2)
Total Protein: 7 g/dL (ref 6.5–8.1)

## 2022-04-03 LAB — TSH: TSH: 2.097 u[IU]/mL (ref 0.350–4.500)

## 2022-04-03 MED ORDER — SODIUM CHLORIDE 0.9 % IV SOLN
200.0000 mg | Freq: Once | INTRAVENOUS | Status: AC
  Administered 2022-04-03: 200 mg via INTRAVENOUS
  Filled 2022-04-03: qty 8

## 2022-04-03 MED ORDER — SODIUM CHLORIDE 0.9 % IV SOLN: Freq: Once | INTRAVENOUS | Status: AC

## 2022-04-03 NOTE — Patient Instructions (Signed)
[facility]  Discharge Instructions: ?Thank you for choosing [facility] to provide your oncology and hematology care.  ? ?If you have a lab appointment with the Cancer Center, please go directly to the Cancer Center and check in at the registration area. ?  ?Wear comfortable clothing and clothing appropriate for easy access to any Portacath or PICC line.  ? ?We strive to give you quality time with your provider. You may need to reschedule your appointment if you arrive late (15 or more minutes).  Arriving late affects you and other patients whose appointments are after yours.  Also, if you miss three or more appointments without notifying the office, you may be dismissed from the clinic at the provider?s discretion.    ?  ?For prescription refill requests, have your pharmacy contact our office and allow 72 hours for refills to be completed.   ? ?Today you received the following chemotherapy and/or immunotherapy agents: Keytruda ?  ?To help prevent nausea and vomiting after your treatment, we encourage you to take your nausea medication as directed. ? ?BELOW ARE SYMPTOMS THAT SHOULD BE REPORTED IMMEDIATELY: ?*FEVER GREATER THAN 100.4 F (38 ?C) OR HIGHER ?*CHILLS OR SWEATING ?*NAUSEA AND VOMITING THAT IS NOT CONTROLLED WITH YOUR NAUSEA MEDICATION ?*UNUSUAL SHORTNESS OF BREATH ?*UNUSUAL BRUISING OR BLEEDING ?*URINARY PROBLEMS (pain or burning when urinating, or frequent urination) ?*BOWEL PROBLEMS (unusual diarrhea, constipation, pain near the anus) ?TENDERNESS IN MOUTH AND THROAT WITH OR WITHOUT PRESENCE OF ULCERS (sore throat, sores in mouth, or a toothache) ?UNUSUAL RASH, SWELLING OR PAIN  ?UNUSUAL VAGINAL DISCHARGE OR ITCHING  ? ?Items with * indicate a potential emergency and should be followed up as soon as possible or go to the Emergency Department if any problems should occur. ? ?Please show the CHEMOTHERAPY ALERT CARD or IMMUNOTHERAPY ALERT CARD at check-in to the  Emergency Department and triage nurse. ? ?Should you have questions after your visit or need to cancel or reschedule your appointment, please contact [facility]  Dept: [phone]  and follow the prompts.  Office hours are 8:00 a.m. to 4:30 p.m. Monday - Friday. Please note that voicemails left after 4:00 p.m. may not be returned until the following business day.  We are closed weekends and major holidays. You have access to a nurse at all times for urgent questions. Please call the main number to the clinic Dept: [phone] and follow the prompts. ? ? ?For any non-urgent questions, you may also contact your provider using MyChart. We now offer e-Visits for anyone 18 and older to request care online for non-urgent symptoms. For details visit [email or web address]. ?  ?Also download the MyChart app! Go to the app store, search "MyChart", open the app, select [facility], and log in with your MyChart username and password. ? ?Due to Covid, a mask is required upon entering the hospital/clinic. If you do not have a mask, one will be given to you upon arrival. For doctor visits, patients may have 1 support person aged 18 or older with them. For treatment visits, patients cannot have anyone with them due to current Covid guidelines and our immunocompromised population.  ? ?

## 2022-04-03 NOTE — Progress Notes (Signed)
Hematology and Oncology Follow Up Visit  Donna Andersen [id number] [birth year] 80 y.o. 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     Prior Therapy: Robotic assisted laparoscopic left radical nephrectomy completed on September 27, 2021.  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    Current therapy: Pembrolizumab 200 mg started on January 09, 2022.  Donna Andersen is here for cycle 5 of therapy.  Interim History: Donna Andersen is here for return evaluation.  Since her last visit, Donna Andersen continues to tolerate therapy without any complaints.  Donna Andersen does have mild skin eruptions that are not problematic at this time.  Donna Andersen denies any pruritus or irritation.  Donna Andersen denies any shortness of breath, difficulty breathing or changes in her bowel habits.    Medications: Updated on review. Current Outpatient Medications  Medication Sig Dispense Refill   calcium carbonate (OS-CAL - DOSED IN MG OF ELEMENTAL CALCIUM) 1250 MG tablet Take 1 tablet by mouth every 14 (fourteen) days.     Cholecalciferol (VITAMIN D3) 2400 UNIT/ML LIQD Take 2,400 Units by mouth daily.     docusate sodium (COLACE) 100 MG capsule Take 1 capsule (100 mg total) by mouth 2 (two) times daily as needed for mild constipation. 30 capsule 0   EPINEPHrine 0.3 mg/0.3 mL IJ SOAJ injection Inject 0.3 mg into the muscle as needed for anaphylaxis.     hydrocortisone 2.5 % cream Apply topically 2 (two) times daily as needed.     olmesartan (BENICAR) 40 MG tablet TAKE 1 TABLET BY MOUTH EVERY DAY 90 tablet 3   Pembrolizumab (KEYTRUDA IV)      prochlorperazine (COMPAZINE) 10 MG tablet Take 1 tablet (10 mg total) by mouth every 6 (six) hours as needed for nausea or vomiting. 30 tablet 0   rosuvastatin (CRESTOR) 5 MG  tablet TAKE '5MG'$  BY MOUTH EVERY OTHER DAY 45 tablet 3   SYMBICORT 80-4.5 MCG/ACT inhaler Inhale 2 puffs into the lungs daily as needed (allergies).     No current facility-administered medications for this visit.     Allergies:  Allergies  Allergen Reactions   Bee Venom Hives   Atorvastatin     REACTION: dental pain, myalgias, and headache   Lisinopril     REACTION: cough   Losartan Potassium     REACTION: drowsiness   Shrimp Extract Allergy Skin Test    Sulfonamide Derivatives     REACTION: Rash, Hives, Asthma   Latex Itching, Rash and Other (See Comments)     scars   Other Rash and Hives    shrimp. shrimp - Anaphylaxis (swelling & hives) Tape. Tape - scars, itching from latex tape      Physical Exam:  Blood pressure (!) 152/67, pulse 76, temperature 97.7 F (36.5 C), temperature source Temporal, resp. rate 18, height 5' (1.524 m), weight 142 lb 8 oz (64.6 kg), SpO2 100 %.   ECOG: 0    General appearance: Alert, awake without any distress. Head: Atraumatic without abnormalities Oropharynx: Without any thrush or ulcers. Eyes: No scleral icterus. Lymph nodes: No lymphadenopathy noted in the cervical, supraclavicular, or axillary nodes Heart:regular rate and rhythm, without any murmurs or gallops.   Lung: Clear to auscultation without any rhonchi, wheezes or dullness to percussion. Abdomin: Soft, nontender without any shifting dullness or ascites.  Musculoskeletal: No clubbing or cyanosis. Neurological: No motor or sensory deficits. Skin: No rashes or lesions.       Lab Results: Lab Results  Component Value Date   WBC 10.4 03/13/2022   HGB 12.9 03/13/2022   HCT 39.6 03/13/2022   MCV 92.7 03/13/2022   PLT 289 03/13/2022     Chemistry      Component Value Date/Time   NA 138 03/13/2022 1143   NA 136 (A) 11/17/2021 0000   K 4.5 03/13/2022 1143   CL 107 03/13/2022 1143   CO2 22 03/13/2022 1143   BUN 31 (H) 03/13/2022 1143   BUN 24 (A) 11/17/2021 0000    CREATININE 1.28 (H) 03/13/2022 1143   CREATININE 0.79 10/10/2020 1147   GLU 108 11/17/2021 0000      Component Value Date/Time   CALCIUM 9.0 03/13/2022 1143   CALCIUM 9.4 01/26/2013 0241   ALKPHOS 76 03/13/2022 1143   AST 17 03/13/2022 1143   ALT 16 03/13/2022 1143   BILITOT 0.5 03/13/2022 1143        Impression and Plan:    81 year old woman with:  1.  Kidney cancer diagnosed in November 2022.  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  Donna Andersen continues to be on adjuvant Pembrolizumab without any major issues.  Risks and benefits of continuing this treatment to complete a year were discussed at this time.  His complications including autoimmune concerns, skin rash or GI toxicity were discussed.  He is agreeable to continue at this time.  The option of switching to every 6 weeks were reviewed and Donna Andersen is wanting to continue with every 3 weeks.  2.  IV access: No Port-A-Cath is needed at this time.  Peripheral veins are currently in use.   3.  Antiemetics: Compazine is available to her without any nausea or vomiting.  4.  Autoimmune complications surveillance: Donna Andersen has not experienced any issues.  These include pneumonitis, colitis and thyroid disease.   5.  Follow-up: Donna Andersen will return in 3 weeks for a follow-up visit.   30  minutes were dedicated to this encounter.  The time spent on reviewing laboratory data, disease status update and outlining future plan of care discussion.      Zola Button, MD 5/30/20239:04 AM

## 2022-04-11 ENCOUNTER — Telehealth: Payer: Self-pay | Admitting: Oncology

## 2022-04-11 NOTE — Telephone Encounter (Signed)
Scheduled per 05/30 los, patient has been called and notified. 

## 2022-04-24 ENCOUNTER — Inpatient Hospital Stay (HOSPITAL_BASED_OUTPATIENT_CLINIC_OR_DEPARTMENT_OTHER): Payer: Medicare HMO | Admitting: Oncology

## 2022-04-24 ENCOUNTER — Other Ambulatory Visit: Payer: Self-pay

## 2022-04-24 ENCOUNTER — Inpatient Hospital Stay: Payer: Medicare HMO | Attending: Oncology

## 2022-04-24 ENCOUNTER — Inpatient Hospital Stay: Payer: Medicare HMO

## 2022-04-24 VITALS — BP 130/64 | HR 89 | Temp 98.1°F | Resp 16 | Ht 60.0 in | Wt 145.1 lb

## 2022-04-24 DIAGNOSIS — Z79899 Other long term (current) drug therapy: Secondary | ICD-10-CM | POA: Insufficient documentation

## 2022-04-24 DIAGNOSIS — C642 Malignant neoplasm of left kidney, except renal pelvis: Secondary | ICD-10-CM

## 2022-04-24 DIAGNOSIS — Z5111 Encounter for antineoplastic chemotherapy: Secondary | ICD-10-CM | POA: Insufficient documentation

## 2022-04-24 LAB — CMP (CANCER CENTER ONLY)
ALT: 13 U/L (ref 0–44)
AST: 14 U/L — ABNORMAL LOW (ref 15–41)
Albumin: 4.3 g/dL (ref 3.5–5.0)
Alkaline Phosphatase: 86 U/L (ref 38–126)
Anion gap: 7 (ref 5–15)
BUN: 22 mg/dL (ref 8–23)
CO2: 26 mmol/L (ref 22–32)
Calcium: 9.3 mg/dL (ref 8.9–10.3)
Chloride: 104 mmol/L (ref 98–111)
Creatinine: 1.23 mg/dL — ABNORMAL HIGH (ref 0.44–1.00)
GFR, Estimated: 45 mL/min — ABNORMAL LOW (ref >60)
Glucose, Bld: 106 mg/dL — ABNORMAL HIGH (ref 70–99)
Potassium: 4.3 mmol/L (ref 3.5–5.1)
Sodium: 137 mmol/L (ref 135–145)
Total Bilirubin: 0.4 mg/dL (ref 0.3–1.2)
Total Protein: 7.3 g/dL (ref 6.5–8.1)

## 2022-04-24 LAB — CBC WITH DIFFERENTIAL (CANCER CENTER ONLY)
Abs Immature Granulocytes: 0.06 10*3/uL (ref 0.00–0.07)
Basophils Absolute: 0.1 10*3/uL (ref 0.0–0.1)
Basophils Relative: 1 %
Eosinophils Absolute: 0.1 10*3/uL (ref 0.0–0.5)
Eosinophils Relative: 1 %
HCT: 38 % (ref 36.0–46.0)
Hemoglobin: 12.6 g/dL (ref 12.0–15.0)
Immature Granulocytes: 1 %
Lymphocytes Relative: 17 %
Lymphs Abs: 1.6 10*3/uL (ref 0.7–4.0)
MCH: 30.5 pg (ref 26.0–34.0)
MCHC: 33.2 g/dL (ref 30.0–36.0)
MCV: 92 fL (ref 80.0–100.0)
Monocytes Absolute: 0.5 10*3/uL (ref 0.1–1.0)
Monocytes Relative: 5 %
Neutro Abs: 6.9 10*3/uL (ref 1.7–7.7)
Neutrophils Relative %: 75 %
Platelet Count: 323 10*3/uL (ref 150–400)
RBC: 4.13 MIL/uL (ref 3.87–5.11)
RDW: 12.3 % (ref 11.5–15.5)
WBC Count: 9.2 10*3/uL (ref 4.0–10.5)
nRBC: 0 % (ref 0.0–0.2)

## 2022-04-24 LAB — TSH: TSH: 1.347 u[IU]/mL (ref 0.350–4.500)

## 2022-04-24 MED ORDER — SODIUM CHLORIDE 0.9 % IV SOLN
200.0000 mg | Freq: Once | INTRAVENOUS | Status: AC
  Administered 2022-04-24: 200 mg via INTRAVENOUS
  Filled 2022-04-24: qty 8

## 2022-04-24 MED ORDER — SODIUM CHLORIDE 0.9 % IV SOLN: Freq: Once | INTRAVENOUS | Status: AC

## 2022-04-24 NOTE — Progress Notes (Signed)
Hematology and Oncology Follow Up Visit  [name] [id number] 10/25/1941 81 y.o. 04/24/2022 11:01 AM Donna Andersen, Donna Fetch, MD   Principle Diagnosis: 71 year old woman with kidney cancer diagnosed in November 2022.  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   Prior Therapy: Robotic assisted laparoscopic left radical nephrectomy completed on September 27, 2021.  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    Current therapy: Pembrolizumab 200 mg started on January 09, 2022.  She is here for cycle 6 of therapy.  Interim History: [name] returns today for a follow-up.  Since last visit, she reports no major changes in her health.  She tolerated the last cycle of Pembro with a few complaints.  She reported headaches and arthralgias which is resolved at this point.  She is able to eat well without any complaints.  She denies any nausea, vomiting or abdominal pain.  She denies any diarrhea or hematuria.    Medications: Reviewed without changes. Current Outpatient Medications  Medication Sig Dispense Refill   calcium carbonate (OS-CAL - DOSED IN MG OF ELEMENTAL CALCIUM) 1250 MG tablet Take 1 tablet by mouth every 14 (fourteen) days.     Cholecalciferol (VITAMIN D3) 2400 UNIT/ML LIQD Take 2,400 Units by mouth daily.     docusate sodium (COLACE) 100 MG capsule Take 1 capsule (100 mg total) by mouth 2 (two) times daily as needed for mild constipation. 30 capsule 0   EPINEPHrine 0.3 mg/0.3 mL IJ SOAJ injection Inject 0.3 mg into the muscle as needed for anaphylaxis.     hydrocortisone 2.5 % cream Apply topically 2 (two) times daily as needed.     olmesartan (BENICAR) 40 MG tablet TAKE 1 TABLET BY MOUTH EVERY DAY 90 tablet 3   Pembrolizumab (KEYTRUDA IV)      prochlorperazine (COMPAZINE) 10 MG tablet Take 1 tablet (10 mg total) by mouth  every 6 (six) hours as needed for nausea or vomiting. 30 tablet 0   rosuvastatin (CRESTOR) 5 MG tablet TAKE '5MG'$  BY MOUTH EVERY OTHER DAY 45 tablet 3   SYMBICORT 80-4.5 MCG/ACT inhaler Inhale 2 puffs into the lungs daily as needed (allergies).     No current facility-administered medications for this visit.     Allergies:  Allergies  Allergen Reactions   Bee Venom Hives   Atorvastatin     REACTION: dental pain, myalgias, and headache   Lisinopril     REACTION: cough   Losartan Potassium     REACTION: drowsiness   Shrimp Extract Allergy Skin Test    Sulfonamide Derivatives     REACTION: Rash, Hives, Asthma   Latex Itching, Rash and Other (See Comments)     scars   Other Rash and Hives    shrimp. shrimp - Anaphylaxis (swelling & hives) Tape. Tape - scars, itching from latex tape      Physical Exam:  Blood pressure 130/64, pulse 89, temperature 98.1 F (36.7 C), temperature source Temporal, resp. rate 16, height 5' (1.524 m), weight 145 lb 1.6 oz (65.8 kg), SpO2 100 %.    ECOG: 0   General appearance: Comfortable appearing without any discomfort Head: Normocephalic without any trauma Oropharynx: Mucous membranes are moist and pink without any thrush or ulcers. Eyes: Pupils are equal and round reactive to light. Lymph nodes: No cervical, supraclavicular, inguinal or axillary lymphadenopathy.   Heart:regular rate and  rhythm.  S1 and S2 without leg edema. Lung: Clear without any rhonchi or wheezes.  No dullness to percussion. Abdomin: Soft, nontender, nondistended with good bowel sounds.  No hepatosplenomegaly. Musculoskeletal: No joint deformity or effusion.  Full range of motion noted. Neurological: No deficits noted on motor, sensory and deep tendon reflex exam. Skin: No petechial rash or dryness.  Appeared moist.        Lab Results: Lab Results  Component Value Date   WBC 8.1 04/03/2022   HGB 12.7 04/03/2022   HCT 38.0 04/03/2022   MCV 91.3 04/03/2022    PLT 271 04/03/2022     Chemistry      Component Value Date/Time   NA 137 04/03/2022 0904   NA 136 (A) 11/17/2021 0000   K 4.3 04/03/2022 0904   CL 105 04/03/2022 0904   CO2 25 04/03/2022 0904   BUN 28 (H) 04/03/2022 0904   BUN 24 (A) 11/17/2021 0000   CREATININE 1.25 (H) 04/03/2022 0904   CREATININE 0.79 10/10/2020 1147   GLU 108 11/17/2021 0000      Component Value Date/Time   CALCIUM 9.6 04/03/2022 0904   CALCIUM 9.4 01/26/2013 0241   ALKPHOS 87 04/03/2022 0904   AST 14 (L) 04/03/2022 0904   ALT 14 04/03/2022 0904   BILITOT 0.5 04/03/2022 0904        Impression and Plan:    81 year old woman with:  1.  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  No disease status was updated at this time and treatment choices were discussed.  She is currently on Pembrolizumab which she has tolerated very well without any complaints.  Risks and benefits of continuing this treatment were reviewed.  The goal is to complete 17 cycles are close to 1 year of therapy.  She is agreeable to proceed.  She will have updated scans in October 2023.   2.  IV access: Peripheral veins are currently in use.   3.  Antiemetics: No nausea or vomiting reported at this time Compazine is available to her.  4.  Autoimmune complications surveillance: I continue to educate her about issues including pneumonitis, colitis and thyroid disease.   5.  Follow-up: 3 weeks for follow-up visit.   30  minutes were spent on this visit.  The time was dedicated to reviewing laboratory data, disease status update and outlining future plan of care review.      Zola Button, MD 6/20/202311:01 AM

## 2022-04-25 DIAGNOSIS — D2271 Melanocytic nevi of right lower limb, including hip: Secondary | ICD-10-CM | POA: Diagnosis not present

## 2022-04-25 DIAGNOSIS — D225 Melanocytic nevi of trunk: Secondary | ICD-10-CM | POA: Diagnosis not present

## 2022-04-25 DIAGNOSIS — L239 Allergic contact dermatitis, unspecified cause: Secondary | ICD-10-CM | POA: Diagnosis not present

## 2022-04-25 DIAGNOSIS — L578 Other skin changes due to chronic exposure to nonionizing radiation: Secondary | ICD-10-CM | POA: Diagnosis not present

## 2022-04-25 DIAGNOSIS — L820 Inflamed seborrheic keratosis: Secondary | ICD-10-CM | POA: Diagnosis not present

## 2022-04-25 DIAGNOSIS — L814 Other melanin hyperpigmentation: Secondary | ICD-10-CM | POA: Diagnosis not present

## 2022-04-25 DIAGNOSIS — L821 Other seborrheic keratosis: Secondary | ICD-10-CM | POA: Diagnosis not present

## 2022-04-25 DIAGNOSIS — C44612 Basal cell carcinoma of skin of right upper limb, including shoulder: Secondary | ICD-10-CM | POA: Diagnosis not present

## 2022-04-25 DIAGNOSIS — D1801 Hemangioma of skin and subcutaneous tissue: Secondary | ICD-10-CM | POA: Diagnosis not present

## 2022-04-25 DIAGNOSIS — T50905D Adverse effect of unspecified drugs, medicaments and biological substances, subsequent encounter: Secondary | ICD-10-CM | POA: Diagnosis not present

## 2022-04-25 DIAGNOSIS — D485 Neoplasm of uncertain behavior of skin: Secondary | ICD-10-CM | POA: Diagnosis not present

## 2022-04-25 DIAGNOSIS — Z85820 Personal history of malignant melanoma of skin: Secondary | ICD-10-CM | POA: Diagnosis not present

## 2022-05-15 ENCOUNTER — Inpatient Hospital Stay (HOSPITAL_BASED_OUTPATIENT_CLINIC_OR_DEPARTMENT_OTHER): Payer: Medicare HMO | Admitting: Oncology

## 2022-05-15 ENCOUNTER — Inpatient Hospital Stay: Payer: Medicare HMO | Attending: Oncology

## 2022-05-15 ENCOUNTER — Inpatient Hospital Stay: Payer: Medicare HMO

## 2022-05-15 VITALS — BP 143/54 | HR 77 | Temp 97.8°F | Resp 17 | Ht 60.0 in | Wt 144.7 lb

## 2022-05-15 DIAGNOSIS — Z905 Acquired absence of kidney: Secondary | ICD-10-CM | POA: Diagnosis not present

## 2022-05-15 DIAGNOSIS — Z5111 Encounter for antineoplastic chemotherapy: Secondary | ICD-10-CM | POA: Insufficient documentation

## 2022-05-15 DIAGNOSIS — Z79899 Other long term (current) drug therapy: Secondary | ICD-10-CM | POA: Diagnosis not present

## 2022-05-15 DIAGNOSIS — C642 Malignant neoplasm of left kidney, except renal pelvis: Secondary | ICD-10-CM

## 2022-05-15 LAB — CMP (CANCER CENTER ONLY)
ALT: 12 U/L (ref 0–44)
AST: 13 U/L — ABNORMAL LOW (ref 15–41)
Albumin: 4.1 g/dL (ref 3.5–5.0)
Alkaline Phosphatase: 86 U/L (ref 38–126)
Anion gap: 7 (ref 5–15)
BUN: 20 mg/dL (ref 8–23)
CO2: 26 mmol/L (ref 22–32)
Calcium: 9.3 mg/dL (ref 8.9–10.3)
Chloride: 104 mmol/L (ref 98–111)
Creatinine: 1.18 mg/dL — ABNORMAL HIGH (ref 0.44–1.00)
GFR, Estimated: 47 mL/min — ABNORMAL LOW (ref >60)
Glucose, Bld: 82 mg/dL (ref 70–99)
Potassium: 4.1 mmol/L (ref 3.5–5.1)
Sodium: 137 mmol/L (ref 135–145)
Total Bilirubin: 0.4 mg/dL (ref 0.3–1.2)
Total Protein: 6.9 g/dL (ref 6.5–8.1)

## 2022-05-15 LAB — CBC WITH DIFFERENTIAL (CANCER CENTER ONLY)
Abs Immature Granulocytes: 0.05 10*3/uL (ref 0.00–0.07)
Basophils Absolute: 0.1 10*3/uL (ref 0.0–0.1)
Basophils Relative: 1 %
Eosinophils Absolute: 0.2 10*3/uL (ref 0.0–0.5)
Eosinophils Relative: 2 %
HCT: 36.7 % (ref 36.0–46.0)
Hemoglobin: 12.2 g/dL (ref 12.0–15.0)
Immature Granulocytes: 1 %
Lymphocytes Relative: 17 %
Lymphs Abs: 1.6 10*3/uL (ref 0.7–4.0)
MCH: 30.3 pg (ref 26.0–34.0)
MCHC: 33.2 g/dL (ref 30.0–36.0)
MCV: 91.3 fL (ref 80.0–100.0)
Monocytes Absolute: 0.6 10*3/uL (ref 0.1–1.0)
Monocytes Relative: 6 %
Neutro Abs: 7.2 10*3/uL (ref 1.7–7.7)
Neutrophils Relative %: 73 %
Platelet Count: 300 10*3/uL (ref 150–400)
RBC: 4.02 MIL/uL (ref 3.87–5.11)
RDW: 12.1 % (ref 11.5–15.5)
WBC Count: 9.7 10*3/uL (ref 4.0–10.5)
nRBC: 0 % (ref 0.0–0.2)

## 2022-05-15 LAB — TSH: TSH: 0.841 u[IU]/mL (ref 0.350–4.500)

## 2022-05-15 MED ORDER — SODIUM CHLORIDE 0.9 % IV SOLN
200.0000 mg | Freq: Once | INTRAVENOUS | Status: AC
  Administered 2022-05-15: 200 mg via INTRAVENOUS
  Filled 2022-05-15: qty 200

## 2022-05-15 MED ORDER — SODIUM CHLORIDE 0.9 % IV SOLN: Freq: Once | INTRAVENOUS | Status: AC

## 2022-05-15 NOTE — Progress Notes (Signed)
Hematology and Oncology Follow Up Visit  Donna Andersen [id number] 1941-09-10 81 y.o. 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    Prior Therapy: Robotic assisted laparoscopic left radical nephrectomy completed on September 27, 2021.  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    Current therapy: Pembrolizumab 200 mg started on January 09, 2022.  She is here for cycle 7 of therapy.  Interim History: Donna Andersen is here for a follow-up visit.  Since her last visit, she reports no major changes in her health.  She continues to tolerate treatment without any complications.  She does report faint dermatological rash and pruritus but manageable with topical creams.  She does report some mild fatigue but no nausea vomiting or abdominal distention.  Performance status quality of life remains unchanged.    Medications: Updated on review. Current Outpatient Medications  Medication Sig Dispense Refill   calcium carbonate (OS-CAL - DOSED IN MG OF ELEMENTAL CALCIUM) 1250 MG tablet Take 1 tablet by mouth every 14 (fourteen) days.     Cholecalciferol (VITAMIN D3) 2400 UNIT/ML LIQD Take 2,400 Units by mouth daily.     docusate sodium (COLACE) 100 MG capsule Take 1 capsule (100 mg total) by mouth 2 (two) times daily as needed for mild constipation. 30 capsule 0   EPINEPHrine 0.3 mg/0.3 mL IJ SOAJ injection Inject 0.3 mg into the muscle as needed for anaphylaxis.     hydrocortisone 2.5 % cream Apply topically 2 (two) times daily as needed.     olmesartan (BENICAR) 40 MG tablet TAKE 1 TABLET BY MOUTH EVERY DAY 90 tablet 3   Pembrolizumab (KEYTRUDA IV)      prochlorperazine (COMPAZINE) 10 MG tablet Take 1 tablet (10 mg total) by mouth every 6 (six) hours as  needed for nausea or vomiting. 30 tablet 0   rosuvastatin (CRESTOR) 5 MG tablet TAKE '5MG'$  BY MOUTH EVERY OTHER DAY 45 tablet 3   SYMBICORT 80-4.5 MCG/ACT inhaler Inhale 2 puffs into the lungs daily as needed (allergies).     No current facility-administered medications for this visit.     Allergies:  Allergies  Allergen Reactions   Bee Venom Hives   Atorvastatin     REACTION: dental pain, myalgias, and headache   Lisinopril     REACTION: cough   Losartan Potassium     REACTION: drowsiness   Shrimp Extract Allergy Skin Test    Sulfonamide Derivatives     REACTION: Rash, Hives, Asthma   Latex Itching, Rash and Other (See Comments)     scars   Other Rash and Hives    shrimp. shrimp - Anaphylaxis (swelling & hives) Tape. Tape - scars, itching from latex tape      Physical Exam:   Blood pressure (!) 143/54, pulse 77, temperature 97.8 F (36.6 C), temperature source Temporal, resp. rate 17, height 5' (1.524 m), weight 144 lb 11.2 oz (65.6 kg), SpO2 100 %.    ECOG: 0   General appearance: Alert, awake without any distress. Head: Atraumatic without abnormalities Oropharynx: Without any thrush or ulcers. Eyes: No scleral icterus. Lymph nodes: No lymphadenopathy noted in the cervical, supraclavicular, or axillary nodes Heart:regular rate and rhythm, without any murmurs or gallops.   Lung: Clear to auscultation without any rhonchi, wheezes or dullness to  percussion. Abdomin: Soft, nontender without any shifting dullness or ascites. Musculoskeletal: No clubbing or cyanosis. Neurological: No motor or sensory deficits. Skin: No rashes or lesions.       Lab Results: Lab Results  Component Value Date   WBC 9.2 04/24/2022   HGB 12.6 04/24/2022   HCT 38.0 04/24/2022   MCV 92.0 04/24/2022   PLT 323 04/24/2022     Chemistry      Component Value Date/Time   NA 137 04/24/2022 1102   NA 136 (A) 11/17/2021 0000   K 4.3 04/24/2022 1102   CL 104 04/24/2022 1102   CO2 26  04/24/2022 1102   BUN 22 04/24/2022 1102   BUN 24 (A) 11/17/2021 0000   CREATININE 1.23 (H) 04/24/2022 1102   CREATININE 0.79 10/10/2020 1147   GLU 108 11/17/2021 0000      Component Value Date/Time   CALCIUM 9.3 04/24/2022 1102   CALCIUM 9.4 01/26/2013 0241   ALKPHOS 86 04/24/2022 1102   AST 14 (L) 04/24/2022 1102   ALT 13 04/24/2022 1102   BILITOT 0.4 04/24/2022 1102        Impression and Plan:    81 year old woman with:  1.  Kidney cancer diagnosed in 2022.  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  She continues to be on adjuvant therapy with Pembrolizumab without any complications.  Risks and benefits of continuing this treatment were discussed.  Complications including autoimmune issues, GI toxicity and dermatological concerns were discussed.  She is agreeable to continue to complete a year of therapy with continue with updating her staging scans.  Next scans will be in October 2023.  She is agreeable to continue at this time.   2.  IV access: Port-A-Cath option has been deferred at this time.   3.  Antiemetics: Compazine is available to her without any nausea or vomiting.  4.  Autoimmune complications surveillance: She has not experienced any complications with pneumonitis, colitis and thyroid disease.   5.  Follow-up: She will return in 3 weeks for a follow-up.   30  minutes were  dedicated to this encounter.  The time was spent on reviewing laboratory data, disease status update and outlining future plan of care discussion.      Zola Button, MD 7/11/20238:20 AM

## 2022-05-15 NOTE — Patient Instructions (Signed)
[facility]  Discharge Instructions: ?Thank you for choosing [facility] to provide your oncology and hematology care.  ? ?If you have a lab appointment with the Cancer Center, please go directly to the Cancer Center and check in at the registration area. ?  ?Wear comfortable clothing and clothing appropriate for easy access to any Portacath or PICC line.  ? ?We strive to give you quality time with your provider. You may need to reschedule your appointment if you arrive late (15 or more minutes).  Arriving late affects you and other patients whose appointments are after yours.  Also, if you miss three or more appointments without notifying the office, you may be dismissed from the clinic at the provider?s discretion.    ?  ?For prescription refill requests, have your pharmacy contact our office and allow 72 hours for refills to be completed.   ? ?Today you received the following chemotherapy and/or immunotherapy agents: Keytruda ?  ?To help prevent nausea and vomiting after your treatment, we encourage you to take your nausea medication as directed. ? ?BELOW ARE SYMPTOMS THAT SHOULD BE REPORTED IMMEDIATELY: ?*FEVER GREATER THAN 100.4 F (38 ?C) OR HIGHER ?*CHILLS OR SWEATING ?*NAUSEA AND VOMITING THAT IS NOT CONTROLLED WITH YOUR NAUSEA MEDICATION ?*UNUSUAL SHORTNESS OF BREATH ?*UNUSUAL BRUISING OR BLEEDING ?*URINARY PROBLEMS (pain or burning when urinating, or frequent urination) ?*BOWEL PROBLEMS (unusual diarrhea, constipation, pain near the anus) ?TENDERNESS IN MOUTH AND THROAT WITH OR WITHOUT PRESENCE OF ULCERS (sore throat, sores in mouth, or a toothache) ?UNUSUAL RASH, SWELLING OR PAIN  ?UNUSUAL VAGINAL DISCHARGE OR ITCHING  ? ?Items with * indicate a potential emergency and should be followed up as soon as possible or go to the Emergency Department if any problems should occur. ? ?Please show the CHEMOTHERAPY ALERT CARD or IMMUNOTHERAPY ALERT CARD at check-in to the  Emergency Department and triage nurse. ? ?Should you have questions after your visit or need to cancel or reschedule your appointment, please contact [facility]  Dept: [phone]  and follow the prompts.  Office hours are 8:00 a.m. to 4:30 p.m. Monday - Friday. Please note that voicemails left after 4:00 p.m. may not be returned until the following business day.  We are closed weekends and major holidays. You have access to a nurse at all times for urgent questions. Please call the main number to the clinic Dept: [phone] and follow the prompts. ? ? ?For any non-urgent questions, you may also contact your provider using MyChart. We now offer e-Visits for anyone 18 and older to request care online for non-urgent symptoms. For details visit [email or web address]. ?  ?Also download the MyChart app! Go to the app store, search "MyChart", open the app, select [facility], and log in with your MyChart username and password. ? ?Due to Covid, a mask is required upon entering the hospital/clinic. If you do not have a mask, one will be given to you upon arrival. For doctor visits, patients may have 1 support person aged 18 or older with them. For treatment visits, patients cannot have anyone with them due to current Covid guidelines and our immunocompromised population.  ? ?

## 2022-05-28 ENCOUNTER — Other Ambulatory Visit: Payer: Self-pay

## 2022-06-05 ENCOUNTER — Inpatient Hospital Stay: Payer: Medicare HMO | Attending: Oncology

## 2022-06-05 ENCOUNTER — Inpatient Hospital Stay: Payer: Medicare HMO

## 2022-06-05 ENCOUNTER — Other Ambulatory Visit: Payer: Self-pay

## 2022-06-05 ENCOUNTER — Inpatient Hospital Stay (HOSPITAL_BASED_OUTPATIENT_CLINIC_OR_DEPARTMENT_OTHER): Payer: Medicare HMO | Admitting: Oncology

## 2022-06-05 VITALS — BP 156/64 | HR 75 | Temp 97.8°F | Resp 18 | Ht 60.0 in | Wt 143.3 lb

## 2022-06-05 VITALS — BP 143/64 | HR 75 | Temp 98.2°F | Resp 16

## 2022-06-05 DIAGNOSIS — C642 Malignant neoplasm of left kidney, except renal pelvis: Secondary | ICD-10-CM

## 2022-06-05 DIAGNOSIS — Z5111 Encounter for antineoplastic chemotherapy: Secondary | ICD-10-CM | POA: Diagnosis not present

## 2022-06-05 DIAGNOSIS — Z79899 Other long term (current) drug therapy: Secondary | ICD-10-CM | POA: Diagnosis not present

## 2022-06-05 DIAGNOSIS — Z905 Acquired absence of kidney: Secondary | ICD-10-CM | POA: Diagnosis not present

## 2022-06-05 LAB — CBC WITH DIFFERENTIAL (CANCER CENTER ONLY)
Abs Immature Granulocytes: 0.07 10*3/uL (ref 0.00–0.07)
Basophils Absolute: 0.1 10*3/uL (ref 0.0–0.1)
Basophils Relative: 1 %
Eosinophils Absolute: 0.2 10*3/uL (ref 0.0–0.5)
Eosinophils Relative: 2 %
HCT: 36.7 % (ref 36.0–46.0)
Hemoglobin: 12.3 g/dL (ref 12.0–15.0)
Immature Granulocytes: 1 %
Lymphocytes Relative: 18 %
Lymphs Abs: 1.8 10*3/uL (ref 0.7–4.0)
MCH: 30.3 pg (ref 26.0–34.0)
MCHC: 33.5 g/dL (ref 30.0–36.0)
MCV: 90.4 fL (ref 80.0–100.0)
Monocytes Absolute: 0.7 10*3/uL (ref 0.1–1.0)
Monocytes Relative: 7 %
Neutro Abs: 7.6 10*3/uL (ref 1.7–7.7)
Neutrophils Relative %: 71 %
Platelet Count: 385 10*3/uL (ref 150–400)
RBC: 4.06 MIL/uL (ref 3.87–5.11)
RDW: 11.9 % (ref 11.5–15.5)
WBC Count: 10.5 10*3/uL (ref 4.0–10.5)
nRBC: 0 % (ref 0.0–0.2)

## 2022-06-05 LAB — CMP (CANCER CENTER ONLY)
ALT: 10 U/L (ref 0–44)
AST: 12 U/L — ABNORMAL LOW (ref 15–41)
Albumin: 4.1 g/dL (ref 3.5–5.0)
Alkaline Phosphatase: 88 U/L (ref 38–126)
Anion gap: 9 (ref 5–15)
BUN: 21 mg/dL (ref 8–23)
CO2: 25 mmol/L (ref 22–32)
Calcium: 9.4 mg/dL (ref 8.9–10.3)
Chloride: 103 mmol/L (ref 98–111)
Creatinine: 1.12 mg/dL — ABNORMAL HIGH (ref 0.44–1.00)
GFR, Estimated: 50 mL/min — ABNORMAL LOW (ref >60)
Glucose, Bld: 86 mg/dL (ref 70–99)
Potassium: 4.6 mmol/L (ref 3.5–5.1)
Sodium: 137 mmol/L (ref 135–145)
Total Bilirubin: 0.4 mg/dL (ref 0.3–1.2)
Total Protein: 7.3 g/dL (ref 6.5–8.1)

## 2022-06-05 LAB — TSH: TSH: 2.714 u[IU]/mL (ref 0.350–4.500)

## 2022-06-05 MED ORDER — SODIUM CHLORIDE 0.9 % IV SOLN: Freq: Once | INTRAVENOUS | Status: AC

## 2022-06-05 MED ORDER — SODIUM CHLORIDE 0.9 % IV SOLN
200.0000 mg | Freq: Once | INTRAVENOUS | Status: AC
  Administered 2022-06-05: 200 mg via INTRAVENOUS
  Filled 2022-06-05: qty 200

## 2022-06-05 NOTE — Progress Notes (Signed)
Hematology and Oncology Follow Up Visit  Donna Andersen [id number] [birth year] 81 y.o. 06/05/2022 8:07 AM Berton Lan, [name], MD   Principle Diagnosis: 65 year old woman with kidney cancer diagnosed in November 2022.  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   Prior Therapy: Robotic assisted laparoscopic left radical nephrectomy completed on September 27, 2021.  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    Current therapy: Pembrolizumab 200 mg started on January 09, 2022.  She is here for cycle 8 of therapy.  Interim History: Ms. Krisher presents today for a follow-up evaluation.  Since last visit, she reports feeling well without any major complaints.  She has reported some fatigue and tiredness associated with immunotherapy.  She denies any nausea vomiting or abdominal pain.  She denies any worsening diarrhea.    Medications: Reviewed without changes. Current Outpatient Medications  Medication Sig Dispense Refill   calcium carbonate (OS-CAL - DOSED IN MG OF ELEMENTAL CALCIUM) 1250 MG tablet Take 1 tablet by mouth every 14 (fourteen) days.     Cholecalciferol (VITAMIN D3) 2400 UNIT/ML LIQD Take 2,400 Units by mouth daily.     docusate sodium (COLACE) 100 MG capsule Take 1 capsule (100 mg total) by mouth 2 (two) times daily as needed for mild constipation. 30 capsule 0   EPINEPHrine 0.3 mg/0.3 mL IJ SOAJ injection Inject 0.3 mg into the muscle as needed for anaphylaxis.     hydrocortisone 2.5 % cream Apply topically 2 (two) times daily as needed.     olmesartan (BENICAR) 40 MG tablet TAKE 1 TABLET BY MOUTH EVERY DAY 90 tablet 3   Pembrolizumab (KEYTRUDA IV)      prochlorperazine (COMPAZINE) 10 MG tablet Take 1 tablet (10 mg total) by mouth every 6 (six) hours as needed for nausea or vomiting. 30 tablet 0   rosuvastatin (CRESTOR) 5  MG tablet TAKE '5MG'$  BY MOUTH EVERY OTHER DAY 45 tablet 3   SYMBICORT 80-4.5 MCG/ACT inhaler Inhale 2 puffs into the lungs daily as needed (allergies).     No current facility-administered medications for this visit.     Allergies:  Allergies  Allergen Reactions   Bee Venom Hives   Atorvastatin     REACTION: dental pain, myalgias, and headache   Lisinopril     REACTION: cough   Losartan Potassium     REACTION: drowsiness   Shrimp Extract Allergy Skin Test    Sulfonamide Derivatives     REACTION: Rash, Hives, Asthma   Latex Itching, Rash and Other (See Comments)     scars   Other Rash and Hives    shrimp. shrimp - Anaphylaxis (swelling & hives) Tape. Tape - scars, itching from latex tape      Physical Exam:    Blood pressure (!) 156/64, pulse 75, temperature 97.8 F (36.6 C), temperature source Oral, resp. rate 18, height 5' (1.524 m), weight 143 lb 4.8 oz (65 kg), SpO2 100 %.    ECOG: 0    General appearance: Comfortable appearing without any discomfort Head: Normocephalic without any trauma Oropharynx: Mucous membranes are moist and pink without any thrush or ulcers. Eyes: Pupils are equal and round reactive to light. Lymph nodes: No cervical, supraclavicular, inguinal or axillary lymphadenopathy.   Heart:regular rate and rhythm.  S1 and S2 without leg edema. Lung: Clear without any rhonchi or wheezes.  No dullness to  percussion. Abdomin: Soft, nontender, nondistended with good bowel sounds.  No hepatosplenomegaly. Musculoskeletal: No joint deformity or effusion.  Full range of motion noted. Neurological: No deficits noted on motor, sensory and deep tendon reflex exam. Skin: No petechial rash or dryness.  Appeared moist.  Psychiatric: Mood and affect appeared appropriate.         Lab Results: Lab Results  Component Value Date   WBC 9.7 05/15/2022   HGB 12.2 05/15/2022   HCT 36.7 05/15/2022   MCV 91.3 05/15/2022   PLT 300 05/15/2022     Chemistry       Component Value Date/Time   NA 137 05/15/2022 0919   NA 136 (A) 11/17/2021 0000   K 4.1 05/15/2022 0919   CL 104 05/15/2022 0919   CO2 26 05/15/2022 0919   BUN 20 05/15/2022 0919   BUN 24 (A) 11/17/2021 0000   CREATININE 1.18 (H) 05/15/2022 0919   CREATININE 0.79 10/10/2020 1147   GLU 108 11/17/2021 0000      Component Value Date/Time   CALCIUM 9.3 05/15/2022 0919   CALCIUM 9.4 01/26/2013 0241   ALKPHOS 86 05/15/2022 0919   AST 13 (L) 05/15/2022 0919   ALT 12 05/15/2022 0919   BILITOT 0.4 05/15/2022 0919        Impression and Plan:    81 year old woman with:  1.  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  The natural course of her disease was reviewed at this time and treatment choices were discussed.  She is currently receiving adjuvant Pembrolizumab without any major complications.  Risks and benefits of long-term immunotherapy were discussed.  These include autoimmune complications as well as GI toxicity among others.  She is agreeable to proceed.  Plan is to complete 1 year of immunotherapy if she can.  We will update her staging scans October 2023.   2.  IV access: Peripheral veins are currently in use without any issues at this time.   3.  Antiemetics: No nausea or vomiting reported at this time.  Compazine is available to her.  4.  Autoimmune complications surveillance: These include pneumonitis, colitis and thyroid disease.  We will continue to educate her about these issues she has not experienced any at this time.   5.  Follow-up: In 3 weeks for the next evaluation.    30  minutes were spent on this visit.  The time was dedicated to reviewing laboratory data, disease status update and addressing complications related to cancer and cancer therapy.      Zola Button, MD 8/1/20238:07 AM

## 2022-06-05 NOTE — Patient Instructions (Signed)
[facility]  Discharge Instructions: Thank you for choosing [facility] to provide your oncology and hematology care.   If you have a lab appointment with the [facility], please go directly to the [facility] and check in at the registration area.   Wear comfortable clothing and clothing appropriate for easy access to any Portacath or PICC line.   We strive to give you quality time with your provider. You may need to reschedule your appointment if you arrive late (15 or more minutes).  Arriving late affects you and other patients whose appointments are after yours.  Also, if you miss three or more appointments without notifying the office, you may be dismissed from the clinic at the provider's discretion.      For prescription refill requests, have your pharmacy contact our office and allow 72 hours for refills to be completed.    Today you received the following chemotherapy and/or immunotherapy agents: Keytruda      To help prevent nausea and vomiting after your treatment, we encourage you to take your nausea medication as directed.  BELOW ARE SYMPTOMS THAT SHOULD BE REPORTED IMMEDIATELY: *FEVER GREATER THAN 100.4 F (38 C) OR HIGHER *CHILLS OR SWEATING *NAUSEA AND VOMITING THAT IS NOT CONTROLLED WITH YOUR NAUSEA MEDICATION *UNUSUAL SHORTNESS OF BREATH *UNUSUAL BRUISING OR BLEEDING *URINARY PROBLEMS (pain or burning when urinating, or frequent urination) *BOWEL PROBLEMS (unusual diarrhea, constipation, pain near the anus) TENDERNESS IN MOUTH AND THROAT WITH OR WITHOUT PRESENCE OF ULCERS (sore throat, sores in mouth, or a toothache) UNUSUAL RASH, SWELLING OR PAIN  UNUSUAL VAGINAL DISCHARGE OR ITCHING   Items with * indicate a potential emergency and should be followed up as soon as possible or go to the Emergency Department if any problems should occur.  Please show the CHEMOTHERAPY ALERT CARD or IMMUNOTHERAPY ALERT CARD at check-in to  the Emergency Department and triage nurse.  Should you have questions after your visit or need to cancel or reschedule your appointment, please contact [facility]  Dept: [phone]  and follow the prompts.  Office hours are 8:00 a.m. to 4:30 p.m. Monday - Friday. Please note that voicemails left after 4:00 p.m. may not be returned until the following business day.  We are closed weekends and major holidays. You have access to a nurse at all times for urgent questions. Please call the main number to the clinic Dept: [phone] and follow the prompts.   For any non-urgent questions, you may also contact your provider using MyChart. We now offer e-Visits for anyone 91 and older to request care online for non-urgent symptoms. For details visit [email or web address].   Also download the MyChart app! Go to the app store, search "MyChart", open the app, select [facility], and log in with your MyChart username and password.  Due to Covid, a mask is required upon entering the hospital/clinic. If you do not have a mask, one will be given to you upon arrival. For doctor visits, patients may have 1 support person aged 63 or older with them. For treatment visits, patients cannot have anyone with them due to current Covid guidelines and our immunocompromised population.  Pembrolizumab injection What is this medication? PEMBROLIZUMAB (pem broe liz ue mab) is a monoclonal antibody. It is used to treat certain types of cancer. This medicine may be used for other purposes; ask your health care provider or pharmacist if you have questions. COMMON BRAND NAME(S): Keytruda What should  I tell my care team before I take this medication? They need to know if you have any of these conditions: autoimmune diseases like Crohn's disease, ulcerative colitis, or lupus have had or planning to have an allogeneic stem cell transplant (uses someone else's stem cells) history of organ  transplant history of chest radiation nervous system problems like myasthenia gravis or Guillain-Barre syndrome an unusual or allergic reaction to pembrolizumab, other medicines, foods, dyes, or preservatives pregnant or trying to get pregnant breast-feeding How should I use this medication? This medicine is for infusion into a vein. It is given by a health care professional in a hospital or clinic setting. A special MedGuide will be given to you before each treatment. Be sure to read this information carefully each time. Talk to your pediatrician regarding the use of this medicine in children. While this drug may be prescribed for children as young as 6 months for selected conditions, precautions do apply. Overdosage: If you think you have taken too much of this medicine contact a poison control center or emergency room at once. NOTE: This medicine is only for you. Do not share this medicine with others. What if I miss a dose? It is important not to miss your dose. Call your doctor or health care professional if you are unable to keep an appointment. What may interact with this medication? Interactions have not been studied. This list may not describe all possible interactions. Give your health care provider a list of all the medicines, herbs, non-prescription drugs, or dietary supplements you use. Also tell them if you smoke, drink alcohol, or use illegal drugs. Some items may interact with your medicine. What should I watch for while using this medication? Your condition will be monitored carefully while you are receiving this medicine. You may need blood work done while you are taking this medicine. Do not become pregnant while taking this medicine or for 4 months after stopping it. Women should inform their doctor if they wish to become pregnant or think they might be pregnant. There is a potential for serious side effects to an unborn child. Talk to your health care professional or  pharmacist for more information. Do not breast-feed an infant while taking this medicine or for 4 months after the last dose. What side effects may I notice from receiving this medication? Side effects that you should report to your doctor or health care professional as soon as possible: allergic reactions like skin rash, itching or hives, swelling of the face, lips, or tongue bloody or black, tarry breathing problems changes in vision chest pain chills confusion constipation cough diarrhea dizziness or feeling faint or lightheaded fast or irregular heartbeat fever flushing joint pain low blood counts - this medicine may decrease the number of white blood cells, red blood cells and platelets. You may be at increased risk for infections and bleeding. muscle pain muscle weakness pain, tingling, numbness in the hands or feet persistent headache redness, blistering, peeling or loosening of the skin, including inside the mouth signs and symptoms of high blood sugar such as dizziness; dry mouth; dry skin; fruity breath; nausea; stomach pain; increased hunger or thirst; increased urination signs and symptoms of kidney injury like trouble passing urine or change in the amount of urine signs and symptoms of liver injury like dark urine, light-colored stools, loss of appetite, nausea, right upper belly pain, yellowing of the eyes or skin sweating swollen lymph nodes weight loss Side effects that usually do not require medical attention (  report to your doctor or health care professional if they continue or are bothersome): decreased appetite hair loss tiredness This list may not describe all possible side effects. Call your doctor for medical advice about side effects. You may report side effects to FDA at 1-800-FDA-1088. Where should I keep my medication? This drug is given in a hospital or clinic and will not be stored at home. NOTE: This sheet is a summary. It may not cover all possible  information. If you have questions about this medicine, talk to your doctor, pharmacist, or health care provider.  2023 Elsevier/Gold Standard (2021-09-22 00:00:00)

## 2022-06-11 ENCOUNTER — Telehealth: Payer: Self-pay | Admitting: Oncology

## 2022-06-11 NOTE — Telephone Encounter (Signed)
Scheduled per 08/01 los, patient has been called and notified of upcoming appointments.

## 2022-06-14 DIAGNOSIS — D485 Neoplasm of uncertain behavior of skin: Secondary | ICD-10-CM | POA: Diagnosis not present

## 2022-06-14 DIAGNOSIS — L988 Other specified disorders of the skin and subcutaneous tissue: Secondary | ICD-10-CM | POA: Diagnosis not present

## 2022-06-17 ENCOUNTER — Other Ambulatory Visit: Payer: Self-pay | Admitting: Oncology

## 2022-06-17 DIAGNOSIS — C642 Malignant neoplasm of left kidney, except renal pelvis: Secondary | ICD-10-CM

## 2022-06-18 DIAGNOSIS — M9904 Segmental and somatic dysfunction of sacral region: Secondary | ICD-10-CM | POA: Diagnosis not present

## 2022-06-18 DIAGNOSIS — M9905 Segmental and somatic dysfunction of pelvic region: Secondary | ICD-10-CM | POA: Diagnosis not present

## 2022-06-18 DIAGNOSIS — M25511 Pain in right shoulder: Secondary | ICD-10-CM | POA: Diagnosis not present

## 2022-06-18 DIAGNOSIS — M25551 Pain in right hip: Secondary | ICD-10-CM | POA: Diagnosis not present

## 2022-06-18 DIAGNOSIS — M9906 Segmental and somatic dysfunction of lower extremity: Secondary | ICD-10-CM | POA: Diagnosis not present

## 2022-06-21 NOTE — Progress Notes (Signed)
Donna Andersen OFFICE PROGRESS NOTE  Donna Arnt, MD [place] [address]  DIAGNOSIS: 81 year old woman with kidney cancer diagnosed in November 2022.  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   PRIOR THERAPY: Robotic assisted laparoscopic left radical nephrectomy completed on September 27, 2021.  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   CURRENT THERAPY:  Pembrolizumab 200 mg started on January 09, 2022.  She is here for cycle 9 of therapy.   INTERVAL HISTORY: Donna Andersen 81 y.o. female returns to clinic today for follow-up visit.  The patient is feeling fairly well today without any concerning complaints.  She recently had excision of a skin cancer on her heel.  The area is healing well but the patient reports she has not been as active with playing pickle ball.  The only other new complaint is the patient has increased muscle soreness.  She has not needed to take anything for this.  She does not feel the soreness unless she is moving around. She is currently undergoing immunotherapy with Keytruda.  She is status post 8 cycles of treatment and she tolerates this well.  She wants to ensure that her Beryle Flock is still approved to complete the planned 12 cycles to prevent any dose delays.  She also mentions that she is scheduled for restaging CT scan by her surgeon, Dr. Gilford Andersen, on August 22, 2022.  She had rash with her early cycles of treatment which improved with Kenalog cream prescribed by her dermatologist.  She has not had any rashes recently.  She also reports some chest tightness a few days following treatment which improved with her Symbicort.  Denies any changes with her breathing or chronic cough.  The patient states she has allergies and asthma "all her life" and has some chronic cough in the morning  secondary to postnasal drainage.  She states this is unchanged from her baseline.  Today she denies any fever, chills, night sweats, unexplained weight loss, or appetite change.  Denies any nausea, vomiting, diarrhea, constipation, or abdominal pain.  Denies any changes with her bladder habits.  She is here today for evaluation and repeat blood work before undergoing cycle #9.  MEDICAL HISTORY: Past Medical History:  Diagnosis Date   Acute cystitis 09/26/2007   recurrent UTI's   Allergy    Atypical nevus 11/17/2002   Upper Center Back - Moderate   Atypical nevus 11/17/2002   Right Lower Abdomen - Moderate   Atypical nevus 04/23/2006   Left Upper Arm - Slight to Moderate   Atypical nevus 04/23/2006   Left Outer Lower Leg - Slight to Moderate   Atypical nevus 04/15/2012   Left Upper Buttock - Mild   Atypical nevus 08/25/2014   Right Inner Knee - Moderate   Atypical nevus 08/25/2014   Left Scapula - Severe   Basal cell carcinoma 08/25/2014   left axilla tx -curet and cautery   COUGH DUE TO ACE INHIBITORS 06/20/2009   FIBROCYSTIC BREAST DISEASE 10/10/2009   GOITER, MULTINODULAR 10/11/2009   Benign L thyroid nodule   Headache(784.0) 06/08/2008   HEMATURIA UNSPECIFIED 10/05/2008   HYPERGLYCEMIA 09/26/2007   HYPERLIPIDEMIA 07/21/2007   HYPERTENSION 11/22/2008   HYPOTHYROIDISM 07/21/2007   INSOMNIA 06/08/2008   Melanoma in situ ([facility]) 03/07/2011   Base Of Post Neck   Melanoma in situ ([facility]) 05/07/2011  Base Of Post Neck - Clear   MYALGIA 10/23/2010   OSTEOPOROSIS 07/21/2007   Renal cell carcinoma ([facility]) 09/07/2021   Right nephrectomy 09/2021. Dr. 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  MEDICATIONS:  Current Outpatient  Medications  Medication Sig Dispense Refill   calcium carbonate (OS-CAL - DOSED IN MG OF ELEMENTAL CALCIUM) 1250 MG tablet Take 1 tablet by mouth every 14 (fourteen) days.     Cholecalciferol (VITAMIN D3) 2400 UNIT/ML LIQD Take 2,400 Units by mouth daily.     docusate sodium (COLACE) 100 MG capsule Take 1 capsule (100 mg total) by mouth 2 (two) times daily as needed for mild constipation. 30 capsule 0   EPINEPHrine 0.3 mg/0.3 mL IJ SOAJ injection Inject 0.3 mg into the muscle as needed for anaphylaxis.     hydrocortisone 2.5 % cream Apply topically 2 (two) times daily as needed.     olmesartan (BENICAR) 40 MG tablet TAKE 1 TABLET BY MOUTH EVERY DAY 90 tablet 3   Pembrolizumab (KEYTRUDA IV)      prochlorperazine (COMPAZINE) 10 MG tablet Take 1 tablet (10 mg total) by mouth every 6 (six) hours as needed for nausea or vomiting. 30 tablet 0   rosuvastatin (CRESTOR) 5 MG tablet TAKE '5MG'$  BY MOUTH EVERY OTHER DAY 45 tablet 3   SYMBICORT 80-4.5 MCG/ACT inhaler Inhale 2 puffs into the lungs daily as needed (allergies).     No current facility-administered medications for this visit.    SURGICAL HISTORY:  Past Surgical History:  Procedure Laterality Date   ABDOMINAL HYSTERECTOMY  1974   BREAST LUMPECTOMY  1976   benign   COLONOSCOPY     POLYPECTOMY     ROBOT ASSISTED LAPAROSCOPIC NEPHRECTOMY Left 09/27/2021   Procedure: XI ROBOTIC ASSISTED LAPAROSCOPIC RADICAL NEPHRECTOMY;  Surgeon: Ceasar Mons, MD;  Location: [facility];  Service: Urology;  Laterality: Left;   THYROID SURGERY  2011   left nodule removed   VAGINAL DELIVERY     x1    REVIEW OF SYSTEMS:   Review of Systems  Constitutional: Negative for appetite change, chills, fatigue, fever and unexpected weight change.  HENT:   Negative for mouth sores, nosebleeds, sore throat and trouble swallowing.   Eyes: Negative for eye problems and icterus.  Respiratory: Positive for chronic cough in the morning.  Negative forhemoptysis,  shortness of breath and wheezing.   Cardiovascular: Negative for chest pain and leg swelling.  Gastrointestinal: Negative for abdominal pain, constipation, diarrhea, nausea and vomiting.  Genitourinary: Negative for bladder incontinence, difficulty urinating, dysuria, frequency and hematuria.   Musculoskeletal: Positive for manageable muscle soreness.  Negative for back pain, gait problem, neck pain and neck stiffness.  Skin: Negative for itching and rash.  Neurological: Negative for dizziness, extremity weakness, gait problem, headaches, light-headedness and seizures.  Hematological: Negative for adenopathy. Does not bruise/bleed easily.  Psychiatric/Behavioral: Negative for confusion, depression and sleep disturbance. The patient is not nervous/anxious.     PHYSICAL EXAMINATION:  Blood pressure 134/63, pulse 86, temperature 98.1 F (36.7 C), temperature source Temporal, resp. rate 16, height 5' (1.524 m), weight 143 lb 3.2 oz (65 kg), SpO2 100 %.  ECOG PERFORMANCE STATUS: 1  Physical Exam  Constitutional: Oriented to person, place, and time and well-developed, well-nourished, and in  no distress. HENT:  Head: Normocephalic and atraumatic.  Mouth/Throat: Oropharynx is clear and moist. No oropharyngeal exudate.  Eyes: Conjunctivae are normal. Right eye exhibits no discharge. Left eye exhibits no discharge. No scleral icterus.  Neck: Normal range of motion. Neck supple.  Cardiovascular: Normal rate, regular rhythm, normal heart sounds and intact distal pulses.   Pulmonary/Chest: Effort normal and breath sounds normal. No respiratory distress. No wheezes. No rales.  Abdominal: Soft. Bowel sounds are normal. Exhibits no distension and no mass. There is no tenderness.  Musculoskeletal: Normal range of motion. Exhibits no edema.  Lymphadenopathy:    No cervical adenopathy.  Neurological: Alert and oriented to person, place, and time. Exhibits normal muscle tone. Gait normal. Coordination  normal.  Skin: Skin is warm and dry. No rash noted. Not diaphoretic. No erythema. No pallor.  Healing scar on her heel from her recent skin lesion excision. Psychiatric: Mood, memory and judgment normal.  Vitals reviewed.  LABORATORY DATA: Lab Results  Component Value Date   WBC 9.7 06/26/2022   HGB 11.3 (L) 06/26/2022   HCT 34.2 (L) 06/26/2022   MCV 89.1 06/26/2022   PLT 353 06/26/2022      Chemistry      Component Value Date/Time   NA 137 06/05/2022 0815   NA 136 (A) 11/17/2021 0000   K 4.6 06/05/2022 0815   CL 103 06/05/2022 0815   CO2 25 06/05/2022 0815   BUN 21 06/05/2022 0815   BUN 24 (A) 11/17/2021 0000   CREATININE 1.12 (H) 06/05/2022 0815   CREATININE 0.79 10/10/2020 1147   GLU 108 11/17/2021 0000      Component Value Date/Time   CALCIUM 9.4 06/05/2022 0815   CALCIUM 9.4 01/26/2013 0241   ALKPHOS 88 06/05/2022 0815   AST 12 (L) 06/05/2022 0815   ALT 10 06/05/2022 0815   BILITOT 0.4 06/05/2022 0815       RADIOGRAPHIC STUDIES:  No results found.   ASSESSMENT/PLAN:  81 year old woman with:  1.  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     She is currently receiving adjuvant Pembrolizumab without any major complications. Labs were reviewed. She is agreeable to proceed.  Plan is to complete 1 year of immunotherapy if she can.  Dr. Hazeline Junker note mentions he will update her staging scans October 2023.  Of note, the patient states that she is scheduled for a restaging CT scan by Dr. Gilford Andersen on 08/22/2022.  She will proceed with cycle #9 today as scheduled. We will check with the authorization team to ensure her treatment is authorized after 9/5 to prevent dose delays. The patient states she initially received a letter saying her treatment was approved until 9/5.     2.  IV access: Peripheral veins are currently in use without any issues at this time.    3.  Antiemetics: No nausea or vomiting reported at this time.  Compazine is available  to her.  4.  Autoimmune complications surveillance: These include pneumonitis, colitis and thyroid disease.  We will continue to educate her about these issues she has not experienced any at this time.  5. Muscle soreness: Patient reports increased myalgias.  Is manageable and she has not needed to take anything for this.  Encouraged the patient she may take Tylenol if needed.   5.  Follow-up: In 3 weeks for the next evaluation.        No orders of the defined types were placed in this encounter.    The total  time spent in the appointment was 20-29 minutes.   Asif Muchow L Sinthia Karabin, PA-C 06/26/22

## 2022-06-26 ENCOUNTER — Inpatient Hospital Stay (HOSPITAL_BASED_OUTPATIENT_CLINIC_OR_DEPARTMENT_OTHER): Payer: Medicare HMO | Admitting: Physician Assistant

## 2022-06-26 ENCOUNTER — Inpatient Hospital Stay: Payer: Medicare HMO

## 2022-06-26 ENCOUNTER — Other Ambulatory Visit: Payer: Self-pay

## 2022-06-26 VITALS — BP 134/63 | HR 86 | Temp 98.1°F | Resp 16 | Ht 60.0 in | Wt 143.2 lb

## 2022-06-26 DIAGNOSIS — C642 Malignant neoplasm of left kidney, except renal pelvis: Secondary | ICD-10-CM | POA: Diagnosis not present

## 2022-06-26 DIAGNOSIS — Z905 Acquired absence of kidney: Secondary | ICD-10-CM | POA: Diagnosis not present

## 2022-06-26 DIAGNOSIS — Z79899 Other long term (current) drug therapy: Secondary | ICD-10-CM | POA: Diagnosis not present

## 2022-06-26 DIAGNOSIS — Z5111 Encounter for antineoplastic chemotherapy: Secondary | ICD-10-CM | POA: Diagnosis not present

## 2022-06-26 LAB — CBC WITH DIFFERENTIAL (CANCER CENTER ONLY)
Abs Immature Granulocytes: 0.1 10*3/uL — ABNORMAL HIGH (ref 0.00–0.07)
Basophils Absolute: 0.1 10*3/uL (ref 0.0–0.1)
Basophils Relative: 1 %
Eosinophils Absolute: 0.2 10*3/uL (ref 0.0–0.5)
Eosinophils Relative: 2 %
HCT: 34.2 % — ABNORMAL LOW (ref 36.0–46.0)
Hemoglobin: 11.3 g/dL — ABNORMAL LOW (ref 12.0–15.0)
Immature Granulocytes: 1 %
Lymphocytes Relative: 17 %
Lymphs Abs: 1.6 10*3/uL (ref 0.7–4.0)
MCH: 29.4 pg (ref 26.0–34.0)
MCHC: 33 g/dL (ref 30.0–36.0)
MCV: 89.1 fL (ref 80.0–100.0)
Monocytes Absolute: 0.6 10*3/uL (ref 0.1–1.0)
Monocytes Relative: 6 %
Neutro Abs: 7.1 10*3/uL (ref 1.7–7.7)
Neutrophils Relative %: 73 %
Platelet Count: 353 10*3/uL (ref 150–400)
RBC: 3.84 MIL/uL — ABNORMAL LOW (ref 3.87–5.11)
RDW: 12.4 % (ref 11.5–15.5)
WBC Count: 9.7 10*3/uL (ref 4.0–10.5)
nRBC: 0 % (ref 0.0–0.2)

## 2022-06-26 LAB — CMP (CANCER CENTER ONLY)
ALT: 10 U/L (ref 0–44)
AST: 11 U/L — ABNORMAL LOW (ref 15–41)
Albumin: 4 g/dL (ref 3.5–5.0)
Alkaline Phosphatase: 87 U/L (ref 38–126)
Anion gap: 8 (ref 5–15)
BUN: 29 mg/dL — ABNORMAL HIGH (ref 8–23)
CO2: 24 mmol/L (ref 22–32)
Calcium: 9 mg/dL (ref 8.9–10.3)
Chloride: 106 mmol/L (ref 98–111)
Creatinine: 1.23 mg/dL — ABNORMAL HIGH (ref 0.44–1.00)
GFR, Estimated: 45 mL/min — ABNORMAL LOW (ref >60)
Glucose, Bld: 103 mg/dL — ABNORMAL HIGH (ref 70–99)
Potassium: 4.7 mmol/L (ref 3.5–5.1)
Sodium: 138 mmol/L (ref 135–145)
Total Bilirubin: 0.3 mg/dL (ref 0.3–1.2)
Total Protein: 6.6 g/dL (ref 6.5–8.1)

## 2022-06-26 LAB — TSH: TSH: 2.842 u[IU]/mL (ref 0.350–4.500)

## 2022-06-26 MED ORDER — SODIUM CHLORIDE 0.9 % IV SOLN
200.0000 mg | Freq: Once | INTRAVENOUS | Status: AC
  Administered 2022-06-26: 200 mg via INTRAVENOUS
  Filled 2022-06-26: qty 200

## 2022-06-26 MED ORDER — SODIUM CHLORIDE 0.9 % IV SOLN: Freq: Once | INTRAVENOUS | Status: AC

## 2022-06-26 NOTE — Patient Instructions (Signed)
[facility]  Discharge Instructions: Thank you for choosing [facility] to provide your oncology and hematology care.   If you have a lab appointment with the [facility], please go directly to the [facility] and check in at the registration area.   Wear comfortable clothing and clothing appropriate for easy access to any Portacath or PICC line.   We strive to give you quality time with your provider. You may need to reschedule your appointment if you arrive late (15 or more minutes).  Arriving late affects you and other patients whose appointments are after yours.  Also, if you miss three or more appointments without notifying the office, you may be dismissed from the clinic at the provider's discretion.      For prescription refill requests, have your pharmacy contact our office and allow 72 hours for refills to be completed.    Today you received the following chemotherapy and/or immunotherapy agents: Keytruda      To help prevent nausea and vomiting after your treatment, we encourage you to take your nausea medication as directed.  BELOW ARE SYMPTOMS THAT SHOULD BE REPORTED IMMEDIATELY: *FEVER GREATER THAN 100.4 F (38 C) OR HIGHER *CHILLS OR SWEATING *NAUSEA AND VOMITING THAT IS NOT CONTROLLED WITH YOUR NAUSEA MEDICATION *UNUSUAL SHORTNESS OF BREATH *UNUSUAL BRUISING OR BLEEDING *URINARY PROBLEMS (pain or burning when urinating, or frequent urination) *BOWEL PROBLEMS (unusual diarrhea, constipation, pain near the anus) TENDERNESS IN MOUTH AND THROAT WITH OR WITHOUT PRESENCE OF ULCERS (sore throat, sores in mouth, or a toothache) UNUSUAL RASH, SWELLING OR PAIN  UNUSUAL VAGINAL DISCHARGE OR ITCHING   Items with * indicate a potential emergency and should be followed up as soon as possible or go to the Emergency Department if any problems should occur.  Please show the CHEMOTHERAPY ALERT CARD or IMMUNOTHERAPY ALERT CARD at check-in to  the Emergency Department and triage nurse.  Should you have questions after your visit or need to cancel or reschedule your appointment, please contact [facility]  Dept: [phone]  and follow the prompts.  Office hours are 8:00 a.m. to 4:30 p.m. Monday - Friday. Please note that voicemails left after 4:00 p.m. may not be returned until the following business day.  We are closed weekends and major holidays. You have access to a nurse at all times for urgent questions. Please call the main number to the clinic Dept: [phone] and follow the prompts.   For any non-urgent questions, you may also contact your provider using MyChart. We now offer e-Visits for anyone 45 and older to request care online for non-urgent symptoms. For details visit [email or web address].   Also download the MyChart app! Go to the app store, search "MyChart", open the app, select [facility], and log in with your MyChart username and password.  Due to Covid, a mask is required upon entering the hospital/clinic. If you do not have a mask, one will be given to you upon arrival. For doctor visits, patients may have 1 support person aged 34 or older with them. For treatment visits, patients cannot have anyone with them due to current Covid guidelines and our immunocompromised population.  Pembrolizumab injection What is this medication? PEMBROLIZUMAB (pem broe liz ue mab) is a monoclonal antibody. It is used to treat certain types of cancer. This medicine may be used for other purposes; ask your health care provider or pharmacist if you have questions. COMMON BRAND NAME(S): Keytruda What should  I tell my care team before I take this medication? They need to know if you have any of these conditions: autoimmune diseases like Crohn's disease, ulcerative colitis, or lupus have had or planning to have an allogeneic stem cell transplant (uses someone else's stem cells) history of organ  transplant history of chest radiation nervous system problems like myasthenia gravis or Guillain-Barre syndrome an unusual or allergic reaction to pembrolizumab, other medicines, foods, dyes, or preservatives pregnant or trying to get pregnant breast-feeding How should I use this medication? This medicine is for infusion into a vein. It is given by a health care professional in a hospital or clinic setting. A special MedGuide will be given to you before each treatment. Be sure to read this information carefully each time. Talk to your pediatrician regarding the use of this medicine in children. While this drug may be prescribed for children as young as 6 months for selected conditions, precautions do apply. Overdosage: If you think you have taken too much of this medicine contact a poison control center or emergency room at once. NOTE: This medicine is only for you. Do not share this medicine with others. What if I miss a dose? It is important not to miss your dose. Call your doctor or health care professional if you are unable to keep an appointment. What may interact with this medication? Interactions have not been studied. This list may not describe all possible interactions. Give your health care provider a list of all the medicines, herbs, non-prescription drugs, or dietary supplements you use. Also tell them if you smoke, drink alcohol, or use illegal drugs. Some items may interact with your medicine. What should I watch for while using this medication? Your condition will be monitored carefully while you are receiving this medicine. You may need blood work done while you are taking this medicine. Do not become pregnant while taking this medicine or for 4 months after stopping it. Women should inform their doctor if they wish to become pregnant or think they might be pregnant. There is a potential for serious side effects to an unborn child. Talk to your health care professional or  pharmacist for more information. Do not breast-feed an infant while taking this medicine or for 4 months after the last dose. What side effects may I notice from receiving this medication? Side effects that you should report to your doctor or health care professional as soon as possible: allergic reactions like skin rash, itching or hives, swelling of the face, lips, or tongue bloody or black, tarry breathing problems changes in vision chest pain chills confusion constipation cough diarrhea dizziness or feeling faint or lightheaded fast or irregular heartbeat fever flushing joint pain low blood counts - this medicine may decrease the number of white blood cells, red blood cells and platelets. You may be at increased risk for infections and bleeding. muscle pain muscle weakness pain, tingling, numbness in the hands or feet persistent headache redness, blistering, peeling or loosening of the skin, including inside the mouth signs and symptoms of high blood sugar such as dizziness; dry mouth; dry skin; fruity breath; nausea; stomach pain; increased hunger or thirst; increased urination signs and symptoms of kidney injury like trouble passing urine or change in the amount of urine signs and symptoms of liver injury like dark urine, light-colored stools, loss of appetite, nausea, right upper belly pain, yellowing of the eyes or skin sweating swollen lymph nodes weight loss Side effects that usually do not require medical attention (  report to your doctor or health care professional if they continue or are bothersome): decreased appetite hair loss tiredness This list may not describe all possible side effects. Call your doctor for medical advice about side effects. You may report side effects to FDA at 1-800-FDA-1088. Where should I keep my medication? This drug is given in a hospital or clinic and will not be stored at home. NOTE: This sheet is a summary. It may not cover all possible  information. If you have questions about this medicine, talk to your doctor, pharmacist, or health care provider.  2023 Elsevier/Gold Standard (2021-09-22 00:00:00)

## 2022-06-27 LAB — T4: T4, Total: 6.4 ug/dL (ref 4.5–12.0)

## 2022-06-29 DIAGNOSIS — T8130XA Disruption of wound, unspecified, initial encounter: Secondary | ICD-10-CM | POA: Diagnosis not present

## 2022-07-02 ENCOUNTER — Telehealth: Payer: Self-pay

## 2022-07-02 DIAGNOSIS — M546 Pain in thoracic spine: Secondary | ICD-10-CM | POA: Diagnosis not present

## 2022-07-02 DIAGNOSIS — M9902 Segmental and somatic dysfunction of thoracic region: Secondary | ICD-10-CM | POA: Diagnosis not present

## 2022-07-02 DIAGNOSIS — M9901 Segmental and somatic dysfunction of cervical region: Secondary | ICD-10-CM | POA: Diagnosis not present

## 2022-07-02 DIAGNOSIS — M25512 Pain in left shoulder: Secondary | ICD-10-CM | POA: Diagnosis not present

## 2022-07-02 DIAGNOSIS — M9908 Segmental and somatic dysfunction of rib cage: Secondary | ICD-10-CM | POA: Diagnosis not present

## 2022-07-02 DIAGNOSIS — M25511 Pain in right shoulder: Secondary | ICD-10-CM | POA: Diagnosis not present

## 2022-07-02 DIAGNOSIS — M9907 Segmental and somatic dysfunction of upper extremity: Secondary | ICD-10-CM | POA: Diagnosis not present

## 2022-07-02 NOTE — Telephone Encounter (Signed)
T/C from pt asking if it is OK for her to take Keflex while on keytruda

## 2022-07-02 NOTE — Telephone Encounter (Signed)
LM for pt ok to take

## 2022-07-04 DIAGNOSIS — R079 Chest pain, unspecified: Secondary | ICD-10-CM | POA: Diagnosis not present

## 2022-07-10 DIAGNOSIS — M9907 Segmental and somatic dysfunction of upper extremity: Secondary | ICD-10-CM | POA: Diagnosis not present

## 2022-07-10 DIAGNOSIS — M9901 Segmental and somatic dysfunction of cervical region: Secondary | ICD-10-CM | POA: Diagnosis not present

## 2022-07-10 DIAGNOSIS — M25511 Pain in right shoulder: Secondary | ICD-10-CM | POA: Diagnosis not present

## 2022-07-10 DIAGNOSIS — M9908 Segmental and somatic dysfunction of rib cage: Secondary | ICD-10-CM | POA: Diagnosis not present

## 2022-07-10 DIAGNOSIS — M25512 Pain in left shoulder: Secondary | ICD-10-CM | POA: Diagnosis not present

## 2022-07-10 DIAGNOSIS — M9902 Segmental and somatic dysfunction of thoracic region: Secondary | ICD-10-CM | POA: Diagnosis not present

## 2022-07-17 ENCOUNTER — Inpatient Hospital Stay: Payer: Medicare HMO

## 2022-07-17 ENCOUNTER — Other Ambulatory Visit: Payer: Self-pay

## 2022-07-17 ENCOUNTER — Inpatient Hospital Stay: Payer: Medicare HMO | Attending: Oncology

## 2022-07-17 ENCOUNTER — Inpatient Hospital Stay (HOSPITAL_BASED_OUTPATIENT_CLINIC_OR_DEPARTMENT_OTHER): Payer: Medicare HMO | Admitting: Oncology

## 2022-07-17 VITALS — BP 149/58 | HR 88 | Temp 98.2°F | Resp 15 | Ht 60.0 in | Wt 143.2 lb

## 2022-07-17 DIAGNOSIS — C642 Malignant neoplasm of left kidney, except renal pelvis: Secondary | ICD-10-CM

## 2022-07-17 DIAGNOSIS — Z79899 Other long term (current) drug therapy: Secondary | ICD-10-CM | POA: Insufficient documentation

## 2022-07-17 LAB — CBC WITH DIFFERENTIAL (CANCER CENTER ONLY)
Abs Immature Granulocytes: 0.07 10*3/uL (ref 0.00–0.07)
Basophils Absolute: 0.1 10*3/uL (ref 0.0–0.1)
Basophils Relative: 1 %
Eosinophils Absolute: 0.2 10*3/uL (ref 0.0–0.5)
Eosinophils Relative: 2 %
HCT: 35.4 % — ABNORMAL LOW (ref 36.0–46.0)
Hemoglobin: 11.5 g/dL — ABNORMAL LOW (ref 12.0–15.0)
Immature Granulocytes: 1 %
Lymphocytes Relative: 15 %
Lymphs Abs: 1.6 10*3/uL (ref 0.7–4.0)
MCH: 29.3 pg (ref 26.0–34.0)
MCHC: 32.5 g/dL (ref 30.0–36.0)
MCV: 90.3 fL (ref 80.0–100.0)
Monocytes Absolute: 0.6 10*3/uL (ref 0.1–1.0)
Monocytes Relative: 6 %
Neutro Abs: 7.7 10*3/uL (ref 1.7–7.7)
Neutrophils Relative %: 75 %
Platelet Count: 379 10*3/uL (ref 150–400)
RBC: 3.92 MIL/uL (ref 3.87–5.11)
RDW: 12.7 % (ref 11.5–15.5)
WBC Count: 10.3 10*3/uL (ref 4.0–10.5)
nRBC: 0 % (ref 0.0–0.2)

## 2022-07-17 LAB — TSH: TSH: 2.302 u[IU]/mL (ref 0.350–4.500)

## 2022-07-17 LAB — CMP (CANCER CENTER ONLY)
ALT: 15 U/L (ref 0–44)
AST: 14 U/L — ABNORMAL LOW (ref 15–41)
Albumin: 4.3 g/dL (ref 3.5–5.0)
Alkaline Phosphatase: 89 U/L (ref 38–126)
Anion gap: 9 (ref 5–15)
BUN: 28 mg/dL — ABNORMAL HIGH (ref 8–23)
CO2: 26 mmol/L (ref 22–32)
Calcium: 9.6 mg/dL (ref 8.9–10.3)
Chloride: 101 mmol/L (ref 98–111)
Creatinine: 1.23 mg/dL — ABNORMAL HIGH (ref 0.44–1.00)
GFR, Estimated: 45 mL/min — ABNORMAL LOW (ref >60)
Glucose, Bld: 110 mg/dL — ABNORMAL HIGH (ref 70–99)
Potassium: 4.5 mmol/L (ref 3.5–5.1)
Sodium: 136 mmol/L (ref 135–145)
Total Bilirubin: 0.3 mg/dL (ref 0.3–1.2)
Total Protein: 7.3 g/dL (ref 6.5–8.1)

## 2022-07-17 LAB — CK: Total CK: 33 U/L — ABNORMAL LOW (ref 38–234)

## 2022-07-17 LAB — SEDIMENTATION RATE: Sed Rate: 48 mm/hr — ABNORMAL HIGH (ref 0–22)

## 2022-07-17 LAB — C-REACTIVE PROTEIN: CRP: 2 mg/dL — ABNORMAL HIGH (ref <1.0)

## 2022-07-17 NOTE — Progress Notes (Signed)
Hematology and Oncology Follow Up Visit  Donna Andersen [id number] Nov 20, 1940 81 y.o. 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   Prior Therapy: Robotic assisted laparoscopic left radical nephrectomy completed on September 27, 2021.  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    Current therapy: Pembrolizumab 200 mg started on January 09, 2022.  She is here for cycle 10 of therapy.  Interim History: Ms. Donna Andersen returns today for a follow-up visit.  Since the last visit, she reports a few complaints.  She has reported diffuse myalgias and generalized weakness that has interfered with her ability to function.  She is ambulating and with improvement in her upper body strength.  She denies any nausea, vomiting or abdominal pain.  She denies any fevers or chills.    Medications: Updated on review. Current Outpatient Medications  Medication Sig Dispense Refill   calcium carbonate (OS-CAL - DOSED IN MG OF ELEMENTAL CALCIUM) 1250 MG tablet Take 1 tablet by mouth every 14 (fourteen) days.     Cholecalciferol (VITAMIN D3) 2400 UNIT/ML LIQD Take 2,400 Units by mouth daily.     docusate sodium (COLACE) 100 MG capsule Take 1 capsule (100 mg total) by mouth 2 (two) times daily as needed for mild constipation. 30 capsule 0   EPINEPHrine 0.3 mg/0.3 mL IJ SOAJ injection Inject 0.3 mg into the muscle as needed for anaphylaxis.     hydrocortisone 2.5 % cream Apply topically 2 (two) times daily as needed.     olmesartan (BENICAR) 40 MG tablet TAKE 1 TABLET BY MOUTH EVERY DAY 90 tablet 3   Pembrolizumab (KEYTRUDA IV)      prochlorperazine (COMPAZINE) 10 MG tablet Take 1 tablet (10 mg total) by mouth every 6 (six) hours as needed for nausea or vomiting. 30 tablet  0   rosuvastatin (CRESTOR) 5 MG tablet TAKE '5MG'$  BY MOUTH EVERY OTHER DAY 45 tablet 3   SYMBICORT 80-4.5 MCG/ACT inhaler Inhale 2 puffs into the lungs daily as needed (allergies).     No current facility-administered medications for this visit.     Allergies:  Allergies  Allergen Reactions   Bee Venom Hives   Atorvastatin     REACTION: dental pain, myalgias, and headache   Lisinopril     REACTION: cough   Losartan Potassium     REACTION: drowsiness   Shrimp Extract Allergy Skin Test    Sulfonamide Derivatives     REACTION: Rash, Hives, Asthma   Latex Itching, Rash and Other (See Comments)     scars   Other Rash and Hives    shrimp. shrimp - Anaphylaxis (swelling & hives) Tape. Tape - scars, itching from latex tape      Physical Exam:    Blood pressure (!) 149/58, pulse 88, temperature 98.2 F (36.8 C), temperature source Temporal, resp. rate 15, height 5' (1.524 m), weight 143 lb 3.2 oz (65 kg), SpO2 98 %.     ECOG: 0    General appearance: Alert, awake without any distress. Head: Atraumatic without abnormalities Oropharynx: Without any thrush or ulcers. Eyes: No scleral icterus. Lymph nodes: No lymphadenopathy noted in the cervical, supraclavicular, or axillary nodes Heart:regular rate and rhythm, without any murmurs or gallops.   Lung: Clear to auscultation without any rhonchi, wheezes or dullness to percussion.  Abdomin: Soft, nontender without any shifting dullness or ascites. Musculoskeletal: No clubbing or cyanosis. Neurological: No motor or sensory deficits. Skin: No rashes or lesions.         Lab Results: Lab Results  Component Value Date   WBC 9.7 06/26/2022   HGB 11.3 (L) 06/26/2022   HCT 34.2 (L) 06/26/2022   MCV 89.1 06/26/2022   PLT 353 06/26/2022     Chemistry      Component Value Date/Time   NA 138 06/26/2022 0745   NA 136 (A) 11/17/2021 0000   K 4.7 06/26/2022 0745   CL 106 06/26/2022 0745   CO2 24 06/26/2022 0745   BUN 29  (H) 06/26/2022 0745   BUN 24 (A) 11/17/2021 0000   CREATININE 1.23 (H) 06/26/2022 0745   CREATININE 0.79 10/10/2020 1147   GLU 108 11/17/2021 0000      Component Value Date/Time   CALCIUM 9.0 06/26/2022 0745   CALCIUM 9.4 01/26/2013 0241   ALKPHOS 87 06/26/2022 0745   AST 11 (L) 06/26/2022 0745   ALT 10 06/26/2022 0745   BILITOT 0.3 06/26/2022 0745        Impression and Plan:    81 year old woman with:  1.  Kidney cancer diagnosed in November 2022.  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  She continues to be on Pembrolizumab without any major complications.  Risks and benefits of continuing this treatment were discussed at this time.  Autoimmune complications, GI toxicity among others were discussed.  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  I have recommended discontinuation of treatment based on her wishes and she will have repeat imaging studies in October 2023.   2.  IV access: She has reported with peripheral veins at this time.   3.  Antiemetics: As available to her without any nausea or vomiting.   4.  Autoimmune complications surveillance: His complication clinic pneumonitis, colitis and thyroid disease.  He has not experienced any at this time.  We will obtain CPK and CRP as well as sedimentation rate.  Treatment with prednisone may be needed if her symptoms do not improve.   5.  Follow-up: She will return in 3 weeks for a follow-up.   30  minutes were dedicated to this encounter.  The time was spent on reviewing laboratory data, disease status update and outlining future plan of care discussion.      Zola Button, MD 9/12/20238:30 AM

## 2022-07-24 DIAGNOSIS — M25511 Pain in right shoulder: Secondary | ICD-10-CM | POA: Diagnosis not present

## 2022-07-24 DIAGNOSIS — M25512 Pain in left shoulder: Secondary | ICD-10-CM | POA: Diagnosis not present

## 2022-07-24 DIAGNOSIS — M9902 Segmental and somatic dysfunction of thoracic region: Secondary | ICD-10-CM | POA: Diagnosis not present

## 2022-07-24 DIAGNOSIS — M9908 Segmental and somatic dysfunction of rib cage: Secondary | ICD-10-CM | POA: Diagnosis not present

## 2022-07-31 DIAGNOSIS — M25552 Pain in left hip: Secondary | ICD-10-CM | POA: Diagnosis not present

## 2022-07-31 DIAGNOSIS — M25551 Pain in right hip: Secondary | ICD-10-CM | POA: Diagnosis not present

## 2022-07-31 DIAGNOSIS — M9902 Segmental and somatic dysfunction of thoracic region: Secondary | ICD-10-CM | POA: Diagnosis not present

## 2022-07-31 DIAGNOSIS — M25512 Pain in left shoulder: Secondary | ICD-10-CM | POA: Diagnosis not present

## 2022-07-31 DIAGNOSIS — M9908 Segmental and somatic dysfunction of rib cage: Secondary | ICD-10-CM | POA: Diagnosis not present

## 2022-07-31 DIAGNOSIS — M79671 Pain in right foot: Secondary | ICD-10-CM | POA: Diagnosis not present

## 2022-07-31 DIAGNOSIS — M9906 Segmental and somatic dysfunction of lower extremity: Secondary | ICD-10-CM | POA: Diagnosis not present

## 2022-08-07 ENCOUNTER — Inpatient Hospital Stay: Payer: Medicare HMO

## 2022-08-07 ENCOUNTER — Ambulatory Visit: Payer: Medicare HMO

## 2022-08-07 ENCOUNTER — Inpatient Hospital Stay: Payer: Medicare HMO | Attending: Oncology | Admitting: Oncology

## 2022-08-07 ENCOUNTER — Other Ambulatory Visit: Payer: Self-pay

## 2022-08-07 VITALS — BP 139/69 | HR 84 | Temp 98.0°F | Resp 16 | Ht 60.0 in | Wt 141.1 lb

## 2022-08-07 DIAGNOSIS — Z85528 Personal history of other malignant neoplasm of kidney: Secondary | ICD-10-CM | POA: Insufficient documentation

## 2022-08-07 DIAGNOSIS — C642 Malignant neoplasm of left kidney, except renal pelvis: Secondary | ICD-10-CM | POA: Diagnosis not present

## 2022-08-07 DIAGNOSIS — Z08 Encounter for follow-up examination after completed treatment for malignant neoplasm: Secondary | ICD-10-CM | POA: Diagnosis not present

## 2022-08-07 DIAGNOSIS — M791 Myalgia, unspecified site: Secondary | ICD-10-CM | POA: Diagnosis not present

## 2022-08-07 DIAGNOSIS — M2550 Pain in unspecified joint: Secondary | ICD-10-CM | POA: Diagnosis not present

## 2022-08-07 DIAGNOSIS — M353 Polymyalgia rheumatica: Secondary | ICD-10-CM | POA: Diagnosis not present

## 2022-08-07 LAB — CBC WITH DIFFERENTIAL (CANCER CENTER ONLY)
Abs Immature Granulocytes: 0.06 10*3/uL (ref 0.00–0.07)
Basophils Absolute: 0.1 10*3/uL (ref 0.0–0.1)
Basophils Relative: 1 %
Eosinophils Absolute: 0.3 10*3/uL (ref 0.0–0.5)
Eosinophils Relative: 3 %
HCT: 35.6 % — ABNORMAL LOW (ref 36.0–46.0)
Hemoglobin: 11.7 g/dL — ABNORMAL LOW (ref 12.0–15.0)
Immature Granulocytes: 1 %
Lymphocytes Relative: 18 %
Lymphs Abs: 1.7 10*3/uL (ref 0.7–4.0)
MCH: 29.5 pg (ref 26.0–34.0)
MCHC: 32.9 g/dL (ref 30.0–36.0)
MCV: 89.7 fL (ref 80.0–100.0)
Monocytes Absolute: 0.5 10*3/uL (ref 0.1–1.0)
Monocytes Relative: 6 %
Neutro Abs: 6.6 10*3/uL (ref 1.7–7.7)
Neutrophils Relative %: 71 %
Platelet Count: 377 10*3/uL (ref 150–400)
RBC: 3.97 MIL/uL (ref 3.87–5.11)
RDW: 13.1 % (ref 11.5–15.5)
WBC Count: 9.2 10*3/uL (ref 4.0–10.5)
nRBC: 0 % (ref 0.0–0.2)

## 2022-08-07 LAB — CMP (CANCER CENTER ONLY)
ALT: 9 U/L (ref 0–44)
AST: 11 U/L — ABNORMAL LOW (ref 15–41)
Albumin: 4.1 g/dL (ref 3.5–5.0)
Alkaline Phosphatase: 85 U/L (ref 38–126)
Anion gap: 6 (ref 5–15)
BUN: 18 mg/dL (ref 8–23)
CO2: 28 mmol/L (ref 22–32)
Calcium: 9.2 mg/dL (ref 8.9–10.3)
Chloride: 103 mmol/L (ref 98–111)
Creatinine: 1.07 mg/dL — ABNORMAL HIGH (ref 0.44–1.00)
GFR, Estimated: 53 mL/min — ABNORMAL LOW (ref >60)
Glucose, Bld: 98 mg/dL (ref 70–99)
Potassium: 4.4 mmol/L (ref 3.5–5.1)
Sodium: 137 mmol/L (ref 135–145)
Total Bilirubin: 0.4 mg/dL (ref 0.3–1.2)
Total Protein: 6.8 g/dL (ref 6.5–8.1)

## 2022-08-07 LAB — TSH: TSH: 2.472 u[IU]/mL (ref 0.350–4.500)

## 2022-08-07 NOTE — Progress Notes (Signed)
Hematology and Oncology Follow Up Visit  JYLA HOPF [id number] 1941-11-02 81 y.o. 08/07/2022 8:03 AM Berton Lan, [name], MD   Principle Diagnosis: 89 year old woman with kidney cancer diagnosed in November 2022.  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   Prior Therapy: Robotic assisted laparoscopic left radical nephrectomy completed on September 27, 2021.  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    Pembrolizumab 200 mg adjuvantly started on January 09, 2022.  She completed 9 cycles of therapy concluded in September 2023.  Current therapy: Active surveillance.  Interim History: Ms. Husband presents today for a follow-up evaluation.  Since the last visit, she reports improvement in her symptoms.  Her pain in her lower extremities thighs have nearly resolved.  She does have pain and soreness in her left shoulder.  She has reported some stiffness associated with it as well.  The symptoms improved significantly after discontinuation of Pembrolizumab.  She denies any complaints at this time.  She denies any nausea, abdominal pain, flank pain or hematuria.    Medications: Reviewed without changes. Current Outpatient Medications  Medication Sig Dispense Refill   calcium carbonate (OS-CAL - DOSED IN MG OF ELEMENTAL CALCIUM) 1250 MG tablet Take 1 tablet by mouth every 14 (fourteen) days.     Cholecalciferol (VITAMIN D3) 2400 UNIT/ML LIQD Take 2,400 Units by mouth daily.     docusate sodium (COLACE) 100 MG capsule Take 1 capsule (100 mg total) by mouth 2 (two) times daily as needed for mild constipation. 30 capsule 0   EPINEPHrine 0.3 mg/0.3 mL IJ SOAJ injection Inject 0.3 mg into the muscle as needed for anaphylaxis.     hydrocortisone 2.5 % cream Apply topically 2 (two) times daily as needed.     olmesartan (BENICAR) 40 MG tablet  TAKE 1 TABLET BY MOUTH EVERY DAY 90 tablet 3   Pembrolizumab (KEYTRUDA IV)      prochlorperazine (COMPAZINE) 10 MG tablet Take 1 tablet (10 mg total) by mouth every 6 (six) hours as needed for nausea or vomiting. 30 tablet 0   rosuvastatin (CRESTOR) 5 MG tablet TAKE '5MG'$  BY MOUTH EVERY OTHER DAY 45 tablet 3   SYMBICORT 80-4.5 MCG/ACT inhaler Inhale 2 puffs into the lungs daily as needed (allergies).     No current facility-administered medications for this visit.     Allergies:  Allergies  Allergen Reactions   Bee Venom Hives   Atorvastatin     REACTION: dental pain, myalgias, and headache   Lisinopril     REACTION: cough   Losartan Potassium     REACTION: drowsiness   Shrimp Extract Allergy Skin Test    Sulfonamide Derivatives     REACTION: Rash, Hives, Asthma   Latex Itching, Rash and Other (See Comments)     scars   Other Rash and Hives    shrimp. shrimp - Anaphylaxis (swelling & hives) Tape. Tape - scars, itching from latex tape      Physical Exam:    Blood pressure 139/69, pulse 84, temperature 98 F (36.7 C), temperature source Temporal, resp. rate 16, height 5' (1.524 m), weight 141 lb 1.6 oz (64 kg), SpO2 100 %.      ECOG: 0   General appearance: Comfortable appearing without any discomfort Head: Normocephalic without any trauma Oropharynx: Mucous membranes are moist and pink without any thrush or ulcers. Eyes: Pupils  are equal and round reactive to light. Lymph nodes: No cervical, supraclavicular, inguinal or axillary lymphadenopathy.   Heart:regular rate and rhythm.  S1 and S2 without leg edema. Lung: Clear without any rhonchi or wheezes.  No dullness to percussion. Abdomin: Soft, nontender, nondistended with good bowel sounds.  No hepatosplenomegaly. Musculoskeletal: No joint deformity or effusion.  Full range of motion noted. Neurological: No deficits noted on motor, sensory and deep tendon reflex exam. Skin: No petechial rash or dryness.  Appeared  moist.           Lab Results: Lab Results  Component Value Date   WBC 10.3 07/17/2022   HGB 11.5 (L) 07/17/2022   HCT 35.4 (L) 07/17/2022   MCV 90.3 07/17/2022   PLT 379 07/17/2022     Chemistry      Component Value Date/Time   NA 136 07/17/2022 0850   NA 136 (A) 11/17/2021 0000   K 4.5 07/17/2022 0850   CL 101 07/17/2022 0850   CO2 26 07/17/2022 0850   BUN 28 (H) 07/17/2022 0850   BUN 24 (A) 11/17/2021 0000   CREATININE 1.23 (H) 07/17/2022 0850   CREATININE 0.79 10/10/2020 1147   GLU 108 11/17/2021 0000      Component Value Date/Time   CALCIUM 9.6 07/17/2022 0850   CALCIUM 9.4 01/26/2013 0241   ALKPHOS 89 07/17/2022 0850   AST 14 (L) 07/17/2022 0850   ALT 15 07/17/2022 0850   BILITOT 0.3 07/17/2022 0850        Impression and Plan:    81 year old woman with:  1.  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  She is currently on active surveillance after completing adjuvant Pembrolizumab in August 2023.  Although she did not finish 1 year of therapy she was very close to completing labs.  I recommended active surveillance moving forward.  She is scheduled to have a CT scan in October 2023 under the care of [facility].  Systemic therapy could be reintroduced if she has metastatic disease in the future.  She is agreeable with this plan.  2.  IV access: No complications related to previous use of peripheral veins.   3.  Autoimmune complications surveillance: She has experienced arthralgias and myalgias that are likely autoimmune in nature.  She had mild elevation in her CRP but her CK total was decreased.  These findings do not support case of myositis at this time.   4.  Follow-up: Will be in 4 weeks for repeat evaluation.  She continues to be disease-free on CT scan and no complications related to Pembrolizumab, no further follow-up will be required at this time.   30  minutes were spent on this visit.  The  time was dedicated to updating her disease status, discussing treatment choices and outlining future plan of care review.      Zola Button, MD 10/3/20238:03 AM

## 2022-08-20 DIAGNOSIS — C642 Malignant neoplasm of left kidney, except renal pelvis: Secondary | ICD-10-CM | POA: Diagnosis not present

## 2022-08-21 ENCOUNTER — Telehealth: Payer: Self-pay | Admitting: Oncology

## 2022-08-21 DIAGNOSIS — M9901 Segmental and somatic dysfunction of cervical region: Secondary | ICD-10-CM | POA: Diagnosis not present

## 2022-08-21 DIAGNOSIS — M9904 Segmental and somatic dysfunction of sacral region: Secondary | ICD-10-CM | POA: Diagnosis not present

## 2022-08-21 DIAGNOSIS — M9905 Segmental and somatic dysfunction of pelvic region: Secondary | ICD-10-CM | POA: Diagnosis not present

## 2022-08-21 DIAGNOSIS — M25511 Pain in right shoulder: Secondary | ICD-10-CM | POA: Diagnosis not present

## 2022-08-21 DIAGNOSIS — M9906 Segmental and somatic dysfunction of lower extremity: Secondary | ICD-10-CM | POA: Diagnosis not present

## 2022-08-21 DIAGNOSIS — M25512 Pain in left shoulder: Secondary | ICD-10-CM | POA: Diagnosis not present

## 2022-08-21 DIAGNOSIS — M353 Polymyalgia rheumatica: Secondary | ICD-10-CM | POA: Diagnosis not present

## 2022-08-21 DIAGNOSIS — M9908 Segmental and somatic dysfunction of rib cage: Secondary | ICD-10-CM | POA: Diagnosis not present

## 2022-08-21 DIAGNOSIS — M25552 Pain in left hip: Secondary | ICD-10-CM | POA: Diagnosis not present

## 2022-08-21 DIAGNOSIS — M25551 Pain in right hip: Secondary | ICD-10-CM | POA: Diagnosis not present

## 2022-08-21 DIAGNOSIS — M9907 Segmental and somatic dysfunction of upper extremity: Secondary | ICD-10-CM | POA: Diagnosis not present

## 2022-08-21 NOTE — Telephone Encounter (Signed)
Called patient regarding upcoming November appointments, left a voicemail.  

## 2022-08-22 DIAGNOSIS — C642 Malignant neoplasm of left kidney, except renal pelvis: Secondary | ICD-10-CM | POA: Diagnosis not present

## 2022-08-22 DIAGNOSIS — K5730 Diverticulosis of large intestine without perforation or abscess without bleeding: Secondary | ICD-10-CM | POA: Diagnosis not present

## 2022-08-22 DIAGNOSIS — N2889 Other specified disorders of kidney and ureter: Secondary | ICD-10-CM | POA: Diagnosis not present

## 2022-08-22 DIAGNOSIS — R911 Solitary pulmonary nodule: Secondary | ICD-10-CM | POA: Diagnosis not present

## 2022-08-24 DIAGNOSIS — C642 Malignant neoplasm of left kidney, except renal pelvis: Secondary | ICD-10-CM | POA: Diagnosis not present

## 2022-08-29 ENCOUNTER — Other Ambulatory Visit: Payer: Self-pay | Admitting: Urology

## 2022-08-29 DIAGNOSIS — R1909 Other intra-abdominal and pelvic swelling, mass and lump: Secondary | ICD-10-CM

## 2022-08-29 DIAGNOSIS — C642 Malignant neoplasm of left kidney, except renal pelvis: Secondary | ICD-10-CM

## 2022-09-10 ENCOUNTER — Ambulatory Visit: Payer: Medicare HMO | Admitting: Family Medicine

## 2022-09-11 ENCOUNTER — Other Ambulatory Visit: Payer: Self-pay

## 2022-09-11 ENCOUNTER — Inpatient Hospital Stay: Payer: Medicare HMO | Attending: Oncology

## 2022-09-11 ENCOUNTER — Ambulatory Visit: Payer: Medicare HMO | Admitting: Family Medicine

## 2022-09-11 ENCOUNTER — Inpatient Hospital Stay (HOSPITAL_BASED_OUTPATIENT_CLINIC_OR_DEPARTMENT_OTHER): Payer: Medicare HMO | Admitting: Oncology

## 2022-09-11 VITALS — BP 141/65 | HR 75 | Temp 98.8°F | Resp 14 | Wt 142.4 lb

## 2022-09-11 DIAGNOSIS — C642 Malignant neoplasm of left kidney, except renal pelvis: Secondary | ICD-10-CM | POA: Diagnosis not present

## 2022-09-11 DIAGNOSIS — Z905 Acquired absence of kidney: Secondary | ICD-10-CM | POA: Insufficient documentation

## 2022-09-11 DIAGNOSIS — Z85528 Personal history of other malignant neoplasm of kidney: Secondary | ICD-10-CM | POA: Diagnosis not present

## 2022-09-11 DIAGNOSIS — Z79899 Other long term (current) drug therapy: Secondary | ICD-10-CM | POA: Diagnosis not present

## 2022-09-11 LAB — CBC WITH DIFFERENTIAL (CANCER CENTER ONLY)
Abs Immature Granulocytes: 0.07 10*3/uL (ref 0.00–0.07)
Basophils Absolute: 0.1 10*3/uL (ref 0.0–0.1)
Basophils Relative: 1 %
Eosinophils Absolute: 0.3 10*3/uL (ref 0.0–0.5)
Eosinophils Relative: 3 %
HCT: 34.6 % — ABNORMAL LOW (ref 36.0–46.0)
Hemoglobin: 11.2 g/dL — ABNORMAL LOW (ref 12.0–15.0)
Immature Granulocytes: 1 %
Lymphocytes Relative: 20 %
Lymphs Abs: 2.1 10*3/uL (ref 0.7–4.0)
MCH: 29.4 pg (ref 26.0–34.0)
MCHC: 32.4 g/dL (ref 30.0–36.0)
MCV: 90.8 fL (ref 80.0–100.0)
Monocytes Absolute: 0.8 10*3/uL (ref 0.1–1.0)
Monocytes Relative: 7 %
Neutro Abs: 7.6 10*3/uL (ref 1.7–7.7)
Neutrophils Relative %: 68 %
Platelet Count: 336 10*3/uL (ref 150–400)
RBC: 3.81 MIL/uL — ABNORMAL LOW (ref 3.87–5.11)
RDW: 14.4 % (ref 11.5–15.5)
WBC Count: 10.9 10*3/uL — ABNORMAL HIGH (ref 4.0–10.5)
nRBC: 0 % (ref 0.0–0.2)

## 2022-09-11 LAB — CMP (CANCER CENTER ONLY)
ALT: 12 U/L (ref 0–44)
AST: 11 U/L — ABNORMAL LOW (ref 15–41)
Albumin: 4 g/dL (ref 3.5–5.0)
Alkaline Phosphatase: 72 U/L (ref 38–126)
Anion gap: 7 (ref 5–15)
BUN: 25 mg/dL — ABNORMAL HIGH (ref 8–23)
CO2: 26 mmol/L (ref 22–32)
Calcium: 8.8 mg/dL — ABNORMAL LOW (ref 8.9–10.3)
Chloride: 105 mmol/L (ref 98–111)
Creatinine: 1.3 mg/dL — ABNORMAL HIGH (ref 0.44–1.00)
GFR, Estimated: 42 mL/min — ABNORMAL LOW (ref >60)
Glucose, Bld: 103 mg/dL — ABNORMAL HIGH (ref 70–99)
Potassium: 4.1 mmol/L (ref 3.5–5.1)
Sodium: 138 mmol/L (ref 135–145)
Total Bilirubin: 0.3 mg/dL (ref 0.3–1.2)
Total Protein: 6.5 g/dL (ref 6.5–8.1)

## 2022-09-11 LAB — TSH: TSH: 1.944 u[IU]/mL (ref 0.350–4.500)

## 2022-09-11 NOTE — Progress Notes (Signed)
Hematology and Oncology Follow Up Visit  Donna Andersen [id number] 05/21/1941 81 y.o. 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    Pembrolizumab 200 mg adjuvantly started on January 09, 2022.  She completed 9 cycles of therapy concluded in September 2023.  Current therapy: Active surveillance.  Interim History: Ms. Donna Andersen presents today for a follow-up.  Since the last visit,    Medications: Updated on review. Current Outpatient Medications  Medication Sig Dispense Refill   calcium carbonate (OS-CAL - DOSED IN MG OF ELEMENTAL CALCIUM) 1250 MG tablet Take 1 tablet by mouth every 14 (fourteen) days.     Cholecalciferol (VITAMIN D3) 2400 UNIT/ML LIQD Take 2,400 Units by mouth daily.     docusate sodium (COLACE) 100 MG capsule Take 1 capsule (100 mg total) by mouth 2 (two) times daily as needed for mild constipation. 30 capsule 0   EPINEPHrine 0.3 mg/0.3 mL IJ SOAJ injection Inject 0.3 mg into the muscle as needed for anaphylaxis.     hydrocortisone 2.5 % cream Apply topically 2 (two) times daily as needed.     olmesartan (BENICAR) 40 MG tablet TAKE 1 TABLET BY MOUTH EVERY DAY 90 tablet 3   Pembrolizumab (KEYTRUDA IV)      prochlorperazine (COMPAZINE) 10 MG tablet Take 1 tablet (10 mg total) by mouth every 6 (six) hours as needed for nausea or vomiting. 30 tablet 0   rosuvastatin (CRESTOR) 5 MG tablet TAKE '5MG'$  BY MOUTH EVERY OTHER DAY 45 tablet 3   SYMBICORT 80-4.5 MCG/ACT inhaler Inhale 2 puffs into the lungs daily as needed (allergies).     No current facility-administered medications for  this visit.     Allergies:  Allergies  Allergen Reactions   Bee Venom Hives   Atorvastatin     REACTION: dental pain, myalgias, and headache   Lisinopril     REACTION: cough   Losartan Potassium     REACTION: drowsiness   Shrimp Extract Allergy Skin Test    Sulfonamide Derivatives     REACTION: Rash, Hives, Asthma   Latex Itching, Rash and Other (See Comments)     scars   Other Rash and Hives    shrimp. shrimp - Anaphylaxis (swelling & hives) Tape. Tape - scars, itching from latex tape      Physical Exam:          ECOG: 0     General appearance: Alert, awake without any distress. Head: Atraumatic without abnormalities Oropharynx: Without any thrush or ulcers. Eyes: No scleral icterus. Lymph nodes: No lymphadenopathy noted in the cervical, supraclavicular, or axillary nodes Heart:regular rate and rhythm, without any murmurs or gallops.   Lung: Clear to auscultation without any rhonchi, wheezes or dullness to percussion. Abdomin: Soft, nontender without any shifting dullness or ascites. Musculoskeletal: No clubbing or cyanosis. Neurological: No motor or sensory deficits. Skin: No rashes or lesions.           Lab Results: Lab Results  Component Value Date   WBC 9.2 08/07/2022   HGB 11.7 (L) 08/07/2022   HCT 35.6 (L) 08/07/2022   MCV 89.7 08/07/2022   PLT 377 08/07/2022  Chemistry      Component Value Date/Time   NA 137 08/07/2022 0801   NA 136 (A) 11/17/2021 0000   K 4.4 08/07/2022 0801   CL 103 08/07/2022 0801   CO2 28 08/07/2022 0801   BUN 18 08/07/2022 0801   BUN 24 (A) 11/17/2021 0000   CREATININE 1.07 (H) 08/07/2022 0801   CREATININE 0.79 10/10/2020 1147   GLU 108 11/17/2021 0000      Component Value Date/Time   CALCIUM 9.2 08/07/2022 0801   CALCIUM 9.4 01/26/2013 0241   ALKPHOS 85 08/07/2022 0801   AST 11 (L) 08/07/2022 0801   ALT 9 08/07/2022 0801   BILITOT 0.4 08/07/2022 0801      IMPRESSION: 1. Stable examination  status post left nephrectomy without evidence of local recurrence. 2. No evidence of metastatic disease within the chest or abdomen. 3. Stable right renal lesions, previously characterized as cysts on MRI August 15, 2021, with the largest lesion measuring 18 mm appearing minimally complex but without measurable postcontrast enhancement. Suggest attention on follow-up oncologic surveillance imaging. 4. Partially visualized multi-septated 10.1 x 6.3 cm fluid density lesion in the left anterior abdomen appearing to be in continuity with the sigmoid colon, visualized portions of which are slightly increased in comparison to prior CT. In retrospect visible only on the coronal sequence of images on prior MRI dated August 15, 2021 the lesion was T2 hyperintense with thin internal enhancing septations without definite soft tissue nodularity or wall/septal thickening. Suggest more definitive evaluation by pelvic MRI with and without contrast. 5. Stable small bilateral pulmonary nodules. 6. Left-sided colonic diverticulosis without findings of acute diverticulitis.   Impression and Plan:    81 year old woman with:  1.  Kidney cancer diagnosed in November 2022.  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  She is currently on active surveillance after surgery and adjuvant Pembrolizumab.  CT scan obtained in October 2023 was personally reviewed and showed no evidence of relapsed disease.  Risk of relapse was assessed at this time and treatment options were reviewed.  At this time, I have recommended continued active surveillance which she is currently receiving under the care of [facility].  Oral targeted therapy can be utilized in the future she has relapsed disease.  There is a fluid density noted in the left anterior abdomen and pelvis which she will have an MRI   2.  Autoimmune complications surveillance: No residual complications noted at  this time.   3.  Follow-up: No medical oncology will be scheduled at this time unless any issues arise in the future.   30  minutes were dedicated to this encounter.  Time was spent on reviewing laboratory data, disease status update and outlining future plan of care discussion.      Zola Button, MD 11/7/20232:31 PM

## 2022-09-12 DIAGNOSIS — M9907 Segmental and somatic dysfunction of upper extremity: Secondary | ICD-10-CM | POA: Diagnosis not present

## 2022-09-12 DIAGNOSIS — M9901 Segmental and somatic dysfunction of cervical region: Secondary | ICD-10-CM | POA: Diagnosis not present

## 2022-09-12 DIAGNOSIS — M9908 Segmental and somatic dysfunction of rib cage: Secondary | ICD-10-CM | POA: Diagnosis not present

## 2022-09-12 DIAGNOSIS — M25511 Pain in right shoulder: Secondary | ICD-10-CM | POA: Diagnosis not present

## 2022-09-12 DIAGNOSIS — M353 Polymyalgia rheumatica: Secondary | ICD-10-CM | POA: Diagnosis not present

## 2022-09-17 ENCOUNTER — Encounter: Payer: Self-pay | Admitting: Family Medicine

## 2022-09-17 ENCOUNTER — Ambulatory Visit (INDEPENDENT_AMBULATORY_CARE_PROVIDER_SITE_OTHER): Payer: Medicare HMO | Admitting: Family Medicine

## 2022-09-17 VITALS — BP 120/79 | HR 80 | Temp 98.7°F | Ht 60.0 in | Wt 141.0 lb

## 2022-09-17 DIAGNOSIS — M8580 Other specified disorders of bone density and structure, unspecified site: Secondary | ICD-10-CM | POA: Diagnosis not present

## 2022-09-17 DIAGNOSIS — R935 Abnormal findings on diagnostic imaging of other abdominal regions, including retroperitoneum: Secondary | ICD-10-CM

## 2022-09-17 DIAGNOSIS — R300 Dysuria: Secondary | ICD-10-CM | POA: Diagnosis not present

## 2022-09-17 DIAGNOSIS — C642 Malignant neoplasm of left kidney, except renal pelvis: Secondary | ICD-10-CM | POA: Diagnosis not present

## 2022-09-17 DIAGNOSIS — I10 Essential (primary) hypertension: Secondary | ICD-10-CM

## 2022-09-17 NOTE — Patient Instructions (Addendum)
Please return in 6 months for your annual complete physical; please come fasting. '  Your mammogram is due in January.  Please schedule.   I will let you know your urine culture results in 2 days.   If you have any questions or concerns, please don't hesitate to send me a message via MyChart or call the office at [phone]. Thank you for visiting with [place] today! It's our pleasure caring for you.

## 2022-09-17 NOTE — Progress Notes (Signed)
Subjective  CC:  Chief Complaint  Patient presents with   Hypertension    HPI: Donna Andersen is a 81 y.o. female who presents to the office today to address the problems listed above in the chief complaint. Hypertension f/u: Control is good . Pt reports she is doing well. taking medications as instructed, no medication side effects noted, no TIAs, no chest pain on exertion, no dyspnea on exertion, no swelling of ankles. Home readings remain normal. She denies adverse effects from his BP medications. Compliance with medication is good.  Clear cell renal carcinoma; reviewed oncology notes from September. Completed 9 treatments with Keytruda, then stopped. Has neuropathy now. F/u scan clear however noted with abnormality in pelvis/colon: pelvic MRI scheduled for f/u.  Osteopenia: on d and calcium.  C/o 2 days of frequency and dysuria w/o rash or discharge. Drinking fluids.   Assessment  1. White coat syndrome with diagnosis of hypertension   2. Clear cell renal cell carcinoma, left (HCC)   3. Osteopenia, unspecified location   4. Abnormal MRI of abdomen   5. Dysuria      Plan   Hypertension f/u: BP control is well controlled. No change in meds Check for UTI with culture.  Will schedule dexa in 2024 CC renal carcinoma: has aches/pains from treatment but improving; tolerable. F/u scan clear.  F/u on pelvic mri to ensure ok   Education regarding management of these chronic disease states was given. Management strategies discussed on successive visits include dietary and exercise recommendations, goals of achieving and maintaining IBW, and lifestyle modifications aiming for adequate sleep and minimizing stressors.   Follow up: may for cpe  Orders Placed This Encounter  Procedures   Urine Culture   No orders of the defined types were placed in this encounter.     BP Readings from Last 3 Encounters:  09/17/22 120/79  09/11/22 (!) 141/65  08/07/22 139/69   Wt Readings from  Last 3 Encounters:  09/17/22 141 lb (64 kg)  09/11/22 142 lb 6.4 oz (64.6 kg)  08/07/22 141 lb 1.6 oz (64 kg)    Lab Results  Component Value Date   CHOL 213 (H) 03/12/2022   CHOL 214 (H) 03/27/2021   CHOL 216 (H) 04/08/2020   Lab Results  Component Value Date   HDL 60.50 03/12/2022   HDL 58.50 03/27/2021   HDL 59.70 04/08/2020   Lab Results  Component Value Date   LDLCALC 116 (H) 03/12/2022   LDLCALC 126 (H) 03/27/2021   LDLCALC 128 (H) 04/08/2020   Lab Results  Component Value Date   TRIG 185.0 (H) 03/12/2022   TRIG 149.0 03/27/2021   TRIG 138.0 04/08/2020   Lab Results  Component Value Date   CHOLHDL 4 03/12/2022   CHOLHDL 4 03/27/2021   CHOLHDL 4 04/08/2020   Lab Results  Component Value Date   LDLDIRECT 109.0 02/16/2015   LDLDIRECT 145.4 01/26/2013   LDLDIRECT 130.4 01/25/2012   Lab Results  Component Value Date   CREATININE 1.30 (H) 09/11/2022   BUN 25 (H) 09/11/2022   NA 138 09/11/2022   K 4.1 09/11/2022   CL 105 09/11/2022   CO2 26 09/11/2022    The 10-year ASCVD risk score (Arnett DK, et al., 2019) is: 28.2%   Values used to calculate the score:     Age: 65 years     Sex: Female     Is Non-Hispanic African American: No     Diabetic: No  Tobacco smoker: No     Systolic Blood Pressure: 937 mmHg     Is BP treated: Yes     HDL Cholesterol: 60.5 mg/dL     Total Cholesterol: 213 mg/dL  I reviewed the patients updated PMH, FH, and SocHx.    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 She has never used smokeless tobacco. She reports current alcohol use. She reports that she does not use drugs.  Current Meds  Medication Sig   calcium carbonate (OS-CAL - DOSED IN MG OF ELEMENTAL CALCIUM) 1250 MG tablet Take 1 tablet by mouth every 14 (fourteen) days.   Cholecalciferol (VITAMIN D3) 2400 UNIT/ML LIQD Take 2,400 Units by mouth daily.   docusate sodium (COLACE) 100 MG capsule Take 1 capsule (100 mg total) by mouth 2 (two) times daily as needed for mild constipation.   EPINEPHrine 0.3 mg/0.3 mL IJ SOAJ injection Inject 0.3 mg into the muscle as needed for anaphylaxis.   hydrocortisone 2.5 % cream Apply topically 2 (two) times daily as needed.   olmesartan (BENICAR) 40 MG tablet TAKE 1 TABLET BY MOUTH EVERY DAY   rosuvastatin (CRESTOR) 5 MG tablet TAKE '5MG'$  BY MOUTH EVERY OTHER DAY   SYMBICORT 80-4.5 MCG/ACT inhaler Inhale 2 puffs into the lungs daily as needed (allergies).    Review of Systems: Cardiovascular: negative for chest pain, palpitations, leg swelling, orthopnea Respiratory: negative for SOB, wheezing or persistent cough Gastrointestinal: negative for abdominal pain Genitourinary: negative for dysuria or gross hematuria  Objective  Vitals: BP 120/79   Pulse 80   Temp 98.7 F (37.1 C)   Ht 5' (1.524 m)   Wt 141 lb (64 kg)   LMP  (LMP Unknown)   SpO2 97%   BMI 27.54 kg/m  General: no acute distress  Psych:  Alert and oriented, normal mood and affect HEENT:  Normocephalic, atraumatic, supple neck  Cardiovascular:  RRR without murmur. no edema Respiratory:  Good breath sounds bilaterally, CTAB with normal respiratory effort Skin:   Warm, no rashes Neurologic:   Mental status is normal Commons side effects, risks, benefits, and alternatives for medications and treatment plan prescribed today were discussed, and the patient expressed understanding of the given instructions. Patient is instructed to call or message via MyChart if he/she has any questions or concerns regarding our treatment plan. No barriers to understanding were identified. We discussed Red Flag symptoms and signs in detail. Patient expressed understanding regarding what to do in case of urgent or emergency type symptoms.  Medication list was reconciled, printed and provided to the patient in AVS. Patient instructions and summary information was reviewed with the patient as documented in the  AVS. This note was prepared with assistance of Dragon voice recognition software. Occasional wrong-word or sound-a-like substitutions may have occurred due to the inherent limitation

## 2022-09-20 LAB — URINE CULTURE
MICRO NUMBER:: 14180527
SPECIMEN QUALITY:: ADEQUATE

## 2022-09-21 ENCOUNTER — Telehealth: Payer: Self-pay | Admitting: Family Medicine

## 2022-09-21 MED ORDER — AMOXICILLIN-POT CLAVULANATE 875-125 MG PO TABS: 1.0000 | ORAL_TABLET | Freq: Two times a day (BID) | ORAL | 0 refills | Status: DC

## 2022-09-21 NOTE — Telephone Encounter (Signed)
Pt received notification of lab results is asking for a call after provider goes over results.

## 2022-09-21 NOTE — Addendum Note (Signed)
Addended by: Billey Chang on: 09/21/2022 02:18 PM   Modules accepted: Orders

## 2022-09-22 ENCOUNTER — Ambulatory Visit
Admission: RE | Admit: 2022-09-22 | Discharge: 2022-09-22 | Disposition: A | Discharge status: Home / Self Care | Payer: Medicare HMO | Source: Ambulatory Visit | Attending: Urology | Admitting: Urology

## 2022-09-22 DIAGNOSIS — R1909 Other intra-abdominal and pelvic swelling, mass and lump: Secondary | ICD-10-CM

## 2022-09-22 DIAGNOSIS — N838 Other noninflammatory disorders of ovary, fallopian tube and broad ligament: Secondary | ICD-10-CM | POA: Diagnosis not present

## 2022-09-22 DIAGNOSIS — N83201 Unspecified ovarian cyst, right side: Secondary | ICD-10-CM | POA: Diagnosis not present

## 2022-09-22 DIAGNOSIS — C642 Malignant neoplasm of left kidney, except renal pelvis: Secondary | ICD-10-CM

## 2022-09-22 MED ORDER — GADOPICLENOL 0.5 MMOL/ML IV SOLN
7.5000 mL | Freq: Once | INTRAVENOUS | Status: AC | PRN
  Administered 2022-09-22: 7.5 mL via INTRAVENOUS

## 2022-09-25 ENCOUNTER — Ambulatory Visit
Admission: RE | Admit: 2022-09-25 | Discharge: 2022-09-25 | Disposition: A | Discharge status: Home / Self Care | Payer: Self-pay | Source: Ambulatory Visit | Attending: Gynecologic Oncology | Admitting: Gynecologic Oncology

## 2022-09-25 ENCOUNTER — Other Ambulatory Visit: Payer: Self-pay

## 2022-09-25 ENCOUNTER — Telehealth: Payer: Self-pay | Admitting: *Deleted

## 2022-09-25 DIAGNOSIS — Z85528 Personal history of other malignant neoplasm of kidney: Secondary | ICD-10-CM

## 2022-09-25 NOTE — Telephone Encounter (Signed)
Spoke with the patient regarding the referral to GYN oncology. Patient scheduled as new patient with Dr Berline Lopes on 12/4 at 9 am. Patient given an arrival time of 8:30 am.  Explained to the patient the the doctor will perform a pelvic exam at this visit. Patient given the policy that no visitors under the 16 yrs are allowed in the [facility]. Patient given the address/phone number for the clinic and that the center offers free valet service.   Patient offered earlier appt, patient declined and requested Dr Berline Lopes

## 2022-10-02 DIAGNOSIS — C44619 Basal cell carcinoma of skin of left upper limb, including shoulder: Secondary | ICD-10-CM | POA: Diagnosis not present

## 2022-10-02 DIAGNOSIS — L988 Other specified disorders of the skin and subcutaneous tissue: Secondary | ICD-10-CM | POA: Diagnosis not present

## 2022-10-03 DIAGNOSIS — M9907 Segmental and somatic dysfunction of upper extremity: Secondary | ICD-10-CM | POA: Diagnosis not present

## 2022-10-03 DIAGNOSIS — M9906 Segmental and somatic dysfunction of lower extremity: Secondary | ICD-10-CM | POA: Diagnosis not present

## 2022-10-03 DIAGNOSIS — M25511 Pain in right shoulder: Secondary | ICD-10-CM | POA: Diagnosis not present

## 2022-10-03 DIAGNOSIS — M25551 Pain in right hip: Secondary | ICD-10-CM | POA: Diagnosis not present

## 2022-10-03 DIAGNOSIS — M25552 Pain in left hip: Secondary | ICD-10-CM | POA: Diagnosis not present

## 2022-10-03 DIAGNOSIS — M25512 Pain in left shoulder: Secondary | ICD-10-CM | POA: Diagnosis not present

## 2022-10-03 DIAGNOSIS — M9901 Segmental and somatic dysfunction of cervical region: Secondary | ICD-10-CM | POA: Diagnosis not present

## 2022-10-03 DIAGNOSIS — M9905 Segmental and somatic dysfunction of pelvic region: Secondary | ICD-10-CM | POA: Diagnosis not present

## 2022-10-03 DIAGNOSIS — M353 Polymyalgia rheumatica: Secondary | ICD-10-CM | POA: Diagnosis not present

## 2022-10-05 ENCOUNTER — Encounter: Payer: Self-pay | Admitting: Gynecologic Oncology

## 2022-10-08 ENCOUNTER — Ambulatory Visit: Payer: Medicare HMO | Admitting: Psychiatry

## 2022-10-09 ENCOUNTER — Inpatient Hospital Stay: Payer: Medicare HMO

## 2022-10-09 ENCOUNTER — Telehealth: Payer: Self-pay

## 2022-10-09 ENCOUNTER — Inpatient Hospital Stay: Payer: Medicare HMO | Attending: Oncology | Admitting: Gynecologic Oncology

## 2022-10-09 ENCOUNTER — Encounter: Payer: Self-pay | Admitting: Gynecologic Oncology

## 2022-10-09 ENCOUNTER — Other Ambulatory Visit: Payer: Self-pay

## 2022-10-09 VITALS — BP 175/77 | HR 83 | Temp 98.9°F | Resp 16 | Ht 60.0 in | Wt 143.0 lb

## 2022-10-09 DIAGNOSIS — C642 Malignant neoplasm of left kidney, except renal pelvis: Secondary | ICD-10-CM | POA: Insufficient documentation

## 2022-10-09 DIAGNOSIS — Z1211 Encounter for screening for malignant neoplasm of colon: Secondary | ICD-10-CM | POA: Insufficient documentation

## 2022-10-09 DIAGNOSIS — E785 Hyperlipidemia, unspecified: Secondary | ICD-10-CM | POA: Insufficient documentation

## 2022-10-09 DIAGNOSIS — Z9221 Personal history of antineoplastic chemotherapy: Secondary | ICD-10-CM | POA: Diagnosis not present

## 2022-10-09 DIAGNOSIS — E039 Hypothyroidism, unspecified: Secondary | ICD-10-CM | POA: Diagnosis not present

## 2022-10-09 DIAGNOSIS — Z8742 Personal history of other diseases of the female genital tract: Secondary | ICD-10-CM

## 2022-10-09 DIAGNOSIS — N9489 Other specified conditions associated with female genital organs and menstrual cycle: Secondary | ICD-10-CM

## 2022-10-09 DIAGNOSIS — Z905 Acquired absence of kidney: Secondary | ICD-10-CM | POA: Diagnosis not present

## 2022-10-09 DIAGNOSIS — K5790 Diverticulosis of intestine, part unspecified, without perforation or abscess without bleeding: Secondary | ICD-10-CM

## 2022-10-09 DIAGNOSIS — D398 Neoplasm of uncertain behavior of other specified female genital organs: Secondary | ICD-10-CM | POA: Insufficient documentation

## 2022-10-09 DIAGNOSIS — Z85828 Personal history of other malignant neoplasm of skin: Secondary | ICD-10-CM | POA: Diagnosis not present

## 2022-10-09 DIAGNOSIS — I10 Essential (primary) hypertension: Secondary | ICD-10-CM | POA: Insufficient documentation

## 2022-10-09 DIAGNOSIS — Z90710 Acquired absence of both cervix and uterus: Secondary | ICD-10-CM | POA: Diagnosis not present

## 2022-10-09 DIAGNOSIS — N6019 Diffuse cystic mastopathy of unspecified breast: Secondary | ICD-10-CM | POA: Diagnosis not present

## 2022-10-09 DIAGNOSIS — Z7951 Long term (current) use of inhaled steroids: Secondary | ICD-10-CM | POA: Diagnosis not present

## 2022-10-09 DIAGNOSIS — Z79899 Other long term (current) drug therapy: Secondary | ICD-10-CM | POA: Diagnosis not present

## 2022-10-09 LAB — CBC WITH DIFFERENTIAL (CANCER CENTER ONLY)
Abs Immature Granulocytes: 0.05 10*3/uL (ref 0.00–0.07)
Basophils Absolute: 0.1 10*3/uL (ref 0.0–0.1)
Basophils Relative: 1 %
Eosinophils Absolute: 0.2 10*3/uL (ref 0.0–0.5)
Eosinophils Relative: 2 %
HCT: 38.7 % (ref 36.0–46.0)
Hemoglobin: 12.7 g/dL (ref 12.0–15.0)
Immature Granulocytes: 1 %
Lymphocytes Relative: 10 %
Lymphs Abs: 1 10*3/uL (ref 0.7–4.0)
MCH: 29.7 pg (ref 26.0–34.0)
MCHC: 32.8 g/dL (ref 30.0–36.0)
MCV: 90.6 fL (ref 80.0–100.0)
Monocytes Absolute: 0.5 10*3/uL (ref 0.1–1.0)
Monocytes Relative: 6 %
Neutro Abs: 7.5 10*3/uL (ref 1.7–7.7)
Neutrophils Relative %: 80 %
Platelet Count: 307 10*3/uL (ref 150–400)
RBC: 4.27 MIL/uL (ref 3.87–5.11)
RDW: 13.8 % (ref 11.5–15.5)
WBC Count: 9.3 10*3/uL (ref 4.0–10.5)
nRBC: 0 % (ref 0.0–0.2)

## 2022-10-09 LAB — TSH: TSH: 1.452 u[IU]/mL (ref 0.350–4.500)

## 2022-10-09 LAB — CMP (CANCER CENTER ONLY)
ALT: 15 U/L (ref 0–44)
AST: 16 U/L (ref 15–41)
Albumin: 4.3 g/dL (ref 3.5–5.0)
Alkaline Phosphatase: 78 U/L (ref 38–126)
Anion gap: 9 (ref 5–15)
BUN: 15 mg/dL (ref 8–23)
CO2: 24 mmol/L (ref 22–32)
Calcium: 9.5 mg/dL (ref 8.9–10.3)
Chloride: 106 mmol/L (ref 98–111)
Creatinine: 1.06 mg/dL — ABNORMAL HIGH (ref 0.44–1.00)
GFR, Estimated: 53 mL/min — ABNORMAL LOW (ref >60)
Glucose, Bld: 100 mg/dL — ABNORMAL HIGH (ref 70–99)
Potassium: 4.3 mmol/L (ref 3.5–5.1)
Sodium: 139 mmol/L (ref 135–145)
Total Bilirubin: 0.4 mg/dL (ref 0.3–1.2)
Total Protein: 7.1 g/dL (ref 6.5–8.1)

## 2022-10-09 LAB — CEA (ACCESS): CEA (CHCC): 1.3 ng/mL (ref 0.00–5.00)

## 2022-10-09 NOTE — Telephone Encounter (Signed)
Pt was seen in office today by Dr.Tucker. Initial BP was 175/77. On manual recheck it was 168/88. Pt states she took claritin for the first time this morning and that may be the reason. She has no S&S of lightheadedness/dizziness and feels fine. She states she keeps an eye on it at home every 3 days and will call her PCP if BP goes over 140/90/  Dr. Berline Lopes notified

## 2022-10-09 NOTE — Patient Instructions (Signed)
It was very nice to meet you today.  I will attach the note to your lab work once it comes back.  1 test will come back today and the other tomorrow.  You and I will have a phone visit in the next couple of weeks to discuss plan moving forward.  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  The one on the left we have seen for quite some time and has grown some since your MRI last year.  There are no features that raise significant concern for malignancy but imaging is not diagnostic.  We discussed the possibility of getting you scheduled for surgery either before or after you leave for the beach in January.  The other option we discussed is tentatively planning a date for surgery in late February or early March with plan to get an MRI before hand.

## 2022-10-09 NOTE — Progress Notes (Signed)
GYNECOLOGIC ONCOLOGY NEW PATIENT CONSULTATION   Patient Name: Donna Andersen  Patient Age: 81 y.o. Date of Service: 10/09/22 Referring Provider: Ellison Hughs, MD  Primary Care Provider: Leamon Arnt, MD Consulting Provider: Jeral Pinch, MD   Assessment/Plan:  Postmenopausal patient with bilateral adnexal masses.  I reviewed recent imaging with the patient, notable for bilateral adnexal masses.  We looked at her most recent MRI together.  The left adnexal mass is seen on CT scans from earlier this year as well as her abdominal MRI from last year.  There has been some growth of this lesion.  The right adnexal mass was not previously imaged.  We discussed overall findings on MRI including more complex appearance of the right lesion and growth over time of the left lesion, which raise the concern for possible malignancy.  At the same time, neither mass has other findings that we can commonly see with ovarian malignancies.  We discussed that the only way to make a definitive diagnosis is to proceed with surgical excision.  The patient would like to avoid surgery if it is possible and safe.  She feels that she has not quite yet recovered from treatment for her renal cancer.  I suggested today that we obtain tumor markers including a CA125 and CEA.  The patient understands that these are not diagnostic and that they can be normal even in the setting of malignancy.  We discussed potentially scheduling surgery in January.  The patient is supposed to be out of town at [employer] for January and February.  It would be reasonable to schedule surgery once she is back towards the end of February or early March.  We could plan to repeat an MRI in mid February.  If MRI findings are overall more reassuring for a benign process, plan for surgery could be canceled.  If there has been continued increase in size or there is more worrisome appearance of one or both masses, we would proceed with surgery as  we have scheduled.  In reviewing her imaging, a portion of the left cystic mass looks like it may be in very close proximity to the sigmoid colon.  There may be some loss of fat plane.  We will plan for a phone visit next week to review tumor markers and decide on surgical plan (decide whether we are's cajoling surgery soon versus planning to get an MRI in mid February with surgery scheduled a couple of weeks later).  A copy of this note was sent to the patient's referring provider.   65 minutes of total time was spent for this patient encounter, including preparation, face-to-face counseling with the patient and coordination of care, and documentation of the encounter.  Jeral Pinch, MD  Division of Gynecologic Oncology  Department of Obstetrics and Gynecology  [facility]  ___________________________________________  Chief Complaint: Chief Complaint  Patient presents with   Adnexal mass    bilateral    History of Present Illness:  Donna Andersen is a 81 y.o. y.o. female who is seen in consultation at the request of Dr. Lovena Neighbours for an evaluation of bilateral pelvic masses.  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 In 09/2021, she underwent robotic left radical nephrectomy. She then received 9 cycles of adjuvant IO with Pembrolizumab, completed in 06/2022.  CT of the chest and abdomen on 08/22/2022 shows stable exam post left nephrectomy without evidence of local recurrence.  No evidence of metastatic disease within the chest or abdomen.  Stable right renal lesions noted.  Partially visualized multiseptated 10.1 x 6.3 cm fluid density in the left anterior abdomen appearing to be in continuity with the sigmoid colon, visualized portions of which are slightly increased in comparison to prior CT scan.  In retrospect visible only on coronal sequences of images from prior MRI dated August 15, 2021.  On this MRI, thin internal enhancing septation without definitive soft tissue nodularity or wall septal thickening.  Stable small bilateral pulmonary nodules.  Left-sided colonic diverticulosis without findings of acute diverticulitis.  MRI of the pelvis on 09/22/2022 shows a 10.7 cm complex cystic lesion in the left adnexal region, suspicious for cystic ovarian neoplasm although no malignant features are visualized.  3.2 cm complex cystic lesion is seen in the right adnexa, again no malignant features identified.  No evidence of pelvic metastatic disease including no peritoneal thickening or adenopathy.  Severe sigmoid diverticulosis noted without diverticulitis.  Today, the patient reports that she feels well.  She struggled significantly with Keytruda but her fatigue has improved much since she completed treatment.  She continues to have some muscle soreness although this is also improved.  She endorses a good appetite without nausea or emesis.  She denies any abdominal or pelvic pain.  She reports normal bowel function.  She denies any episodes or symptoms consistent with diverticulitis in the past.  She was recently treated for urinary tract infection with Augmentin.  Symptoms resolved.  She denies any urinary symptoms.  Her last colonoscopy was in 2017.  She was told at that time she needed follow-up in 7 years.  She was seen by GI last year and told that she could defer colonoscopy if she wanted.  She ultimately decided to defer colonoscopy.  She underwent hysterectomy in the 1970s before moving to [place].  This was done in the setting of endometriosis.  Her ovaries were left in situ, is unsure whether her tubes were left in situ.  The patient denies ever having had genetic testing.  She clarified her mother's history.  Her understanding was that her mother had ovarian cancer but lived for decades after this diagnosis, never needed any treatment besides surgery, and ultimately  died at the age of 72 from other causes.  PAST MEDICAL HISTORY:  Past Medical History:  Diagnosis Date   Acute cystitis 09/26/2007   recurrent UTI's   Allergy    Atypical nevus 11/17/2002   Upper Center Back - Moderate   Atypical nevus 11/17/2002   Right Lower Abdomen - Moderate   Atypical nevus 04/23/2006   Left Upper Arm - Slight to Moderate   Atypical nevus 04/23/2006   Left Outer Lower Leg - Slight to Moderate   Atypical nevus 04/15/2012   Left Upper Buttock - Mild   Atypical nevus 08/25/2014   Right Inner Knee - Moderate   Atypical nevus 08/25/2014   Left Scapula - Severe   Basal cell carcinoma 08/25/2014   left axilla tx -curet and cautery   COUGH DUE TO ACE INHIBITORS 06/20/2009   FIBROCYSTIC BREAST DISEASE 10/10/2009   GOITER, MULTINODULAR 10/11/2009   Benign L thyroid nodule   Headache(784.0) 06/08/2008   HEMATURIA UNSPECIFIED 10/05/2008   HYPERGLYCEMIA 09/26/2007   HYPERLIPIDEMIA 07/21/2007   HYPERTENSION 11/22/2008   HYPOTHYROIDISM 07/21/2007   INSOMNIA 06/08/2008   Melanoma in situ ([place]) 03/07/2011   Base Of Post Neck   Melanoma in situ [facility]) 05/07/2011   Base  Of Post Neck - Clear   MYALGIA 10/23/2010   OSTEOPOROSIS 07/21/2007   Renal cell carcinoma ([place]) 09/07/2021   Right nephrectomy 09/2021. Dr. 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   Right Buccal Cheek (in situ)(CX35FU)   WEIGHT GAIN 06/08/2008     PAST SURGICAL HISTORY:  Past Surgical History:  Procedure Laterality Date   BREAST LUMPECTOMY  11/05/1974   benign   COLONOSCOPY     POLYPECTOMY     ROBOT ASSISTED LAPAROSCOPIC NEPHRECTOMY Left 09/27/2021   Procedure: XI ROBOTIC ASSISTED LAPAROSCOPIC RADICAL NEPHRECTOMY;  Surgeon: Ceasar Mons, MD;  Location: [facility];  Service: Urology;  Laterality: Left;   THYROID SURGERY  11/05/2009   left nodule removed   VAGINAL DELIVERY     x1   VAGINAL HYSTERECTOMY  11/05/1972    OB/GYN HISTORY:  OB History   Gravida Para Term Preterm AB Living  1 1          SAB IAB Ectopic Multiple Live Births               # Outcome Date GA Lbr Len/2nd Weight Sex Delivery Anes PTL Lv  1 Para             No LMP recorded (lmp unknown). Patient has had a hysterectomy.  Age at menarche: 52 Age at menopause: Unsure, patient had hysterectomy in the 66s Hx of HRT: Denies Hx of STDs: Denies Last pap: Unsure History of abnormal pap smears: Denies  SCREENING STUDIES:  Last mammogram: 2023  Last colonoscopy: 2017 Last bone mineral density: 2019  MEDICATIONS: Outpatient Encounter Medications as of 10/09/2022  Medication Sig   amoxicillin-clavulanate (AUGMENTIN) 875-125 MG tablet Take 1 tablet by mouth 2 (two) times daily.   calcium carbonate (OS-CAL - DOSED IN MG OF ELEMENTAL CALCIUM) 1250 MG tablet Take 1 tablet by mouth every 14 (fourteen) days.   Cholecalciferol (VITAMIN D3) 2400 UNIT/ML LIQD Take 2,400 Units by mouth daily.   EPINEPHrine 0.3 mg/0.3 mL IJ SOAJ injection Inject 0.3 mg into the muscle as needed for anaphylaxis.   hydrocortisone 2.5 % cream Apply topically 2 (two) times daily as needed.   olmesartan (BENICAR) 40 MG tablet TAKE 1 TABLET BY MOUTH EVERY DAY   rosuvastatin (CRESTOR) 5 MG tablet TAKE '5MG'$  BY MOUTH EVERY OTHER DAY   SYMBICORT 80-4.5 MCG/ACT inhaler Inhale 2 puffs into the lungs daily as needed (allergies).   No facility-administered encounter medications on file as of 10/09/2022.    ALLERGIES:  Allergies  Allergen Reactions   Bee Venom Hives   Atorvastatin     REACTION: dental pain, myalgias, and headache   Lisinopril     REACTION: cough   Losartan Potassium     REACTION: drowsiness   Shrimp Extract Allergy Skin Test    Sulfonamide Derivatives     REACTION: Rash, Hives, Asthma   Latex Itching, Rash and Other (See Comments)     scars   Other Rash and Hives    shrimp. shrimp - Anaphylaxis (swelling & hives) Tape. Tape - scars, itching from latex tape     FAMILY  HISTORY:  Family History  Problem Relation Age of Onset   Cancer Mother        Ovarian Cancer   Ovarian cancer Mother    Hypertension Mother    Thyroid disease Mother    Lymphoma Sister    Cancer Sister        Breast Cancer, Kidney Cancer  Leukemia Sister    Breast cancer Maternal Aunt    Colon cancer Neg Hx    Rectal cancer Neg Hx    Stomach cancer Neg Hx    Endometrial cancer Neg Hx    Prostate cancer Neg Hx    Pancreatic cancer Neg Hx      SOCIAL HISTORY:  Social Connections: Moderately Integrated (03/22/2022)   Social Connection and Isolation Panel [NHANES]    Frequency of Communication with Friends and Family: More than three times a week    Frequency of Social Gatherings with Friends and Family: More than three times a week    Attends Religious Services: More than 4 times per year    Active Member of [employer] or Organizations: No    Attends Music therapist: Never    Marital Status: Married    REVIEW OF SYSTEMS:  + muscle soreness Denies appetite changes, fevers, chills, fatigue, unexplained weight changes. Denies hearing loss, neck lumps or masses, mouth sores, ringing in ears or voice changes. Denies cough or wheezing.  Denies shortness of breath. Denies chest pain or palpitations. Denies leg swelling. Denies abdominal distention, pain, blood in stools, constipation, diarrhea, nausea, vomiting, or early satiety. Denies pain with intercourse, dysuria, frequency, hematuria or incontinence. Denies hot flashes, pelvic pain, vaginal bleeding or vaginal discharge.   Denies joint pain, back pain. Denies itching, rash, or wounds. Denies dizziness, headaches, numbness or seizures. Denies swollen lymph nodes or glands, denies easy bruising or bleeding. Denies anxiety, depression, confusion, or decreased concentration.  Physical Exam:  Vital Signs for this encounter:  Blood pressure (!) 175/77, pulse 83, temperature 98.9 F (37.2 C), temperature source Oral,  resp. rate 16, height 5' (1.524 m), weight 143 lb (64.9 kg), SpO2 100 %. Body mass index is 27.93 kg/m. General: Alert, oriented, no acute distress.  HEENT: Normocephalic, atraumatic. Sclera anicteric.  Chest: Clear to auscultation bilaterally. No wheezes, rhonchi, or rales. Cardiovascular: Regular rate and rhythm, no murmurs, rubs, or gallops.  Abdomen: Normoactive bowel sounds. Soft, nondistended, nontender to palpation. No masses or hepatosplenomegaly appreciated. No palpable fluid wave.  Extremities: Grossly normal range of motion. Warm, well perfused. No edema bilaterally.  Skin: No rashes or lesions.  Lymphatics: No cervical, supraclavicular, or inguinal adenopathy.  GU:  Normal external female genitalia. No lesions. No discharge or bleeding.             Bladder/urethra:  No lesions or masses, well supported bladder             Vagina: Moderately atrophic, no lesions.             Cervix/uterus: Surgically absent.             Adnexa: On bimanual exam, there is a mass palpated in the left adnexa.  I cannot distinctly feel this mass but with pressure using the abdominal hands, I can push the mass down into what I presume is the sigmoid colon sitting over the vaginal cuff.  No nodularity appreciated.  Rectal: Confirms above findings.  LABORATORY AND RADIOLOGIC DATA:  Outside medical records were reviewed to synthesize the above history, along with the history and physical obtained during the visit.   Lab Results  Component Value Date   WBC 9.3 10/09/2022   HGB 12.7 10/09/2022   HCT 38.7 10/09/2022   PLT 307 10/09/2022   GLUCOSE 100 (H) 10/09/2022   CHOL 213 (H) 03/12/2022   TRIG 185.0 (H) 03/12/2022   HDL 60.50 03/12/2022   LDLDIRECT  109.0 02/16/2015   LDLCALC 116 (H) 03/12/2022   ALT 15 10/09/2022   AST 16 10/09/2022   NA 139 10/09/2022   K 4.3 10/09/2022   CL 106 10/09/2022   CREATININE 1.06 (H) 10/09/2022   BUN 15 10/09/2022   CO2 24 10/09/2022   TSH 1.452 10/09/2022    HGBA1C 5.6 03/07/2018   MICROALBUR <0.7 03/27/2021

## 2022-10-11 LAB — CA 125: Cancer Antigen (CA) 125: 15.8 U/mL (ref 0.0–38.1)

## 2022-10-17 ENCOUNTER — Telehealth: Payer: Self-pay | Admitting: *Deleted

## 2022-10-17 ENCOUNTER — Telehealth: Payer: Self-pay | Admitting: Oncology

## 2022-10-17 ENCOUNTER — Inpatient Hospital Stay (HOSPITAL_BASED_OUTPATIENT_CLINIC_OR_DEPARTMENT_OTHER): Payer: Medicare HMO | Admitting: Gynecologic Oncology

## 2022-10-17 ENCOUNTER — Encounter: Payer: Self-pay | Admitting: Gynecologic Oncology

## 2022-10-17 DIAGNOSIS — D398 Neoplasm of uncertain behavior of other specified female genital organs: Secondary | ICD-10-CM | POA: Diagnosis not present

## 2022-10-17 DIAGNOSIS — N9489 Other specified conditions associated with female genital organs and menstrual cycle: Secondary | ICD-10-CM

## 2022-10-17 NOTE — Telephone Encounter (Signed)
Per Dr Berline Lopes patient scheduled for an MRI on 2/21 at 12 pm at [facility]. Patient also scheduled for a follow up appt with Dr Berline Lopes on 2/22 at 1:30 pm; with a pre op with Melissa APP after at 1:45 pm. Patient also scheduled for her post op appts on 3/20 and 4/5. Patient aware of appt dates and times

## 2022-10-17 NOTE — Progress Notes (Signed)
Gynecologic Oncology Telehealth Note: Gyn-Onc  I connected with MELISS FLEEK on 10/17/22 at  4:20 PM EST by telephone and verified that I am speaking with the correct person using two identifiers.  I discussed the limitations, risks, security and privacy concerns of performing an evaluation and management service by telemedicine and the availability of in-person appointments. I also discussed with the patient that there may be a patient responsible charge related to this service. The patient expressed understanding and agreed to proceed.  Other persons participating in the visit and their role in the encounter: none.  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 In 09/2021, she underwent robotic left radical nephrectomy. She then received 9 cycles of adjuvant IO with Pembrolizumab, completed in 06/2022.   CT of the chest and abdomen on 08/22/2022 shows stable exam post left nephrectomy without evidence of local recurrence.  No evidence of metastatic disease within the chest or abdomen.  Stable right renal lesions noted.  Partially visualized multiseptated 10.1 x 6.3 cm fluid density in the left anterior abdomen appearing to be in continuity with the sigmoid colon, visualized portions of which are slightly increased in comparison to prior CT scan.  In retrospect visible only on coronal sequences of images from prior MRI dated August 15, 2021.  On this MRI,  thin internal enhancing septation without definitive soft tissue nodularity or wall septal thickening.  Stable small bilateral pulmonary nodules.  Left-sided colonic diverticulosis without findings of acute diverticulitis.   MRI of the pelvis on 09/22/2022 shows a 10.7 cm complex cystic lesion in the left adnexal region, suspicious for cystic ovarian neoplasm although no malignant features are visualized.  3.2 cm complex cystic lesion is seen in the right adnexa, again no malignant features identified.  No evidence of pelvic metastatic disease including no peritoneal thickening or adenopathy.  Severe sigmoid diverticulosis noted without diverticulitis.   Today, the patient reports that she feels well.  She struggled significantly with Keytruda but her fatigue has improved much since she completed treatment.  She continues to have some muscle soreness although this is also improved.  She endorses a good appetite without nausea or emesis.  She denies any abdominal or pelvic pain.  She reports normal bowel function.  She denies any episodes or symptoms consistent with diverticulitis in the past.  She was recently treated for urinary tract infection with Augmentin.  Symptoms resolved.  She denies any urinary symptoms.   Her last colonoscopy was in 2017.  She was told at that time she needed follow-up in 7 years.  She was seen by GI last year and told that she could defer colonoscopy if she wanted.  She ultimately decided to defer colonoscopy.  Tumor markers 10/09/22: CEA - 1.30 CA-125 - 15.8  Interval History: Doing well. No changes since last week.  Past Medical/Surgical History: Past Medical History:  Diagnosis Date   Acute cystitis 09/26/2007   recurrent UTI's   Allergy  Atypical nevus 11/17/2002   Upper Center Back - Moderate   Atypical nevus 11/17/2002   Right Lower Abdomen - Moderate   Atypical nevus 04/23/2006   Left Upper Arm - Slight to Moderate   Atypical nevus 04/23/2006   Left  Outer Lower Leg - Slight to Moderate   Atypical nevus 04/15/2012   Left Upper Buttock - Mild   Atypical nevus 08/25/2014   Right Inner Knee - Moderate   Atypical nevus 08/25/2014   Left Scapula - Severe   Basal cell carcinoma 08/25/2014   left axilla tx -curet and cautery   COUGH DUE TO ACE INHIBITORS 06/20/2009   FIBROCYSTIC BREAST DISEASE 10/10/2009   GOITER, MULTINODULAR 10/11/2009   Benign L thyroid nodule   Headache(784.0) 06/08/2008   HEMATURIA UNSPECIFIED 10/05/2008   HYPERGLYCEMIA 09/26/2007   HYPERLIPIDEMIA 07/21/2007   HYPERTENSION 11/22/2008   HYPOTHYROIDISM 07/21/2007   INSOMNIA 06/08/2008   Melanoma in situ ([place]) 03/07/2011   Base Of Post Neck   Melanoma in situ [facility]) 05/07/2011   Base Of Post Neck - Clear   MYALGIA 10/23/2010   OSTEOPOROSIS 07/21/2007   Renal cell carcinoma ([place]) 09/07/2021   Right nephrectomy 09/2021. Dr. 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   Right Buccal Cheek (in situ)(CX35FU)   WEIGHT GAIN 06/08/2008    Past Surgical History:  Procedure Laterality Date   BREAST LUMPECTOMY  11/05/1974   benign   COLONOSCOPY     POLYPECTOMY     ROBOT ASSISTED LAPAROSCOPIC NEPHRECTOMY Left 09/27/2021   Procedure: XI ROBOTIC ASSISTED LAPAROSCOPIC RADICAL NEPHRECTOMY;  Surgeon: Ceasar Mons, MD;  Location: [facility];  Service: Urology;  Laterality: Left;   THYROID SURGERY  11/05/2009   left nodule removed   VAGINAL DELIVERY     x1   VAGINAL HYSTERECTOMY  11/05/1972    Family History  Problem Relation Age of Onset   Cancer Mother        Ovarian Cancer   Ovarian cancer Mother    Hypertension Mother    Thyroid disease Mother    Lymphoma Sister    Cancer Sister        Breast Cancer, Kidney Cancer   Leukemia Sister    Breast cancer Maternal Aunt    Colon cancer Neg Hx    Rectal cancer Neg Hx    Stomach cancer Neg Hx    Endometrial cancer Neg Hx    Prostate cancer Neg Hx    Pancreatic cancer Neg Hx      Social History   Socioeconomic History   Marital status: Married    Spouse name: Not on file   Number of children: Not on file   Years of education: Not on file   Highest education level: Not on file  Occupational History   Occupation: Homemaker    Employer: RETIRED  Tobacco Use   Smoking status: Never   Smokeless tobacco: Never  Vaping Use   Vaping Use: Never used  Substance and Sexual Activity   Alcohol use: Yes    Alcohol/week: 0.0 standard drinks of alcohol    Comment: socially-2 glasses a week   Drug use: Never   Sexual activity: Not on file  Other Topics Concern   Not on file  Social History Narrative   Does not work outside the home   Social Determinants of Health   Financial Resource Strain: Low Risk  (03/22/2022)   Overall Financial Resource Strain (CARDIA)    Difficulty of Paying Living  Expenses: Not hard at all  Food Insecurity: No Food Insecurity (03/22/2022)   Hunger Vital Sign    Worried About Running Out of Food in the Last Year: Never true    Ran Out of Food in the Last Year: Never true  Transportation Needs: No Transportation Needs (03/22/2022)   PRAPARE - Hydrologist (Medical): No    Lack of Transportation (Non-Medical): No  Physical Activity: Sufficiently Active (03/22/2022)   Exercise Vital Sign    Days of Exercise per Week: 5 days    Minutes of Exercise per Session: 120 min  Stress: No Stress Concern Present (03/22/2022)   [place]    Feeling of Stress : Not at all  Social Connections: Moderately Integrated (03/22/2022)   Social Connection and Isolation Panel [NHANES]    Frequency of Communication with Friends and Family: More than three times a week    Frequency of Social Gatherings with Friends and Family: More than three times a week    Attends Religious Services: More than 4 times per year    Active Member of [employer] or Organizations: No     Attends Archivist Meetings: Never    Marital Status: Married    Current Medications:  Current Outpatient Medications:    amoxicillin-clavulanate (AUGMENTIN) 875-125 MG tablet, Take 1 tablet by mouth 2 (two) times daily., Disp: 10 tablet, Rfl: 0   calcium carbonate (OS-CAL - DOSED IN MG OF ELEMENTAL CALCIUM) 1250 MG tablet, Take 1 tablet by mouth every 14 (fourteen) days., Disp: , Rfl:    Cholecalciferol (VITAMIN D3) 2400 UNIT/ML LIQD, Take 2,400 Units by mouth daily., Disp: , Rfl:    EPINEPHrine 0.3 mg/0.3 mL IJ SOAJ injection, Inject 0.3 mg into the muscle as needed for anaphylaxis., Disp: , Rfl:    hydrocortisone 2.5 % cream, Apply topically 2 (two) times daily as needed., Disp: , Rfl:    olmesartan (BENICAR) 40 MG tablet, TAKE 1 TABLET BY MOUTH EVERY DAY, Disp: 90 tablet, Rfl: 3   rosuvastatin (CRESTOR) 5 MG tablet, TAKE '5MG'$  BY MOUTH EVERY OTHER DAY, Disp: 45 tablet, Rfl: 3   SYMBICORT 80-4.5 MCG/ACT inhaler, Inhale 2 puffs into the lungs daily as needed (allergies)., Disp: , Rfl:   Review of Symptoms: Pertinent positives as per HPI.  Physical Exam: Deferred given limitations of phone visit.  Laboratory & Radiologic Studies: None new  Assessment & Plan: Donna Andersen is a 81 y.o. woman with bilateral adnexal masses.   Discussed tumor markers - normal. Patient understands this is not diagnostic. She is really hoping to delay surgery until at least April. I have strongly recommended repeat MRI in 3 months (mid-February) and tentative plan for surgery in March. We will schedule a date in March that can be canceled or moved depending on MRI results in mid-February. Patient amenable. She knows to call me with any change to symptoms.  I discussed the assessment and treatment plan with the patient. The patient was provided with an opportunity to ask questions and all were answered. The patient agreed with the plan and demonstrated an understanding of the instructions.   The  patient was advised to call back or see an in-person evaluation if the symptoms worsen or if the condition fails to improve as anticipated.   12 minutes of total time was spent for this patient encounter, including preparation, phone counseling with the patient and coordination of care, and documentation of the  encounter.   Jeral Pinch, MD  Division of Gynecologic Oncology  Department of Obstetrics and Gynecology  [facility]

## 2022-10-17 NOTE — Telephone Encounter (Signed)
[facility] and she would like to schedule surgery for 01/16/23 and is wondering if she can have her preop appointment on 01/03/22 since she will be in [facility] for January and February.  Advised her that we will check and call her back.

## 2022-10-24 DIAGNOSIS — M9902 Segmental and somatic dysfunction of thoracic region: Secondary | ICD-10-CM | POA: Diagnosis not present

## 2022-10-24 DIAGNOSIS — M9907 Segmental and somatic dysfunction of upper extremity: Secondary | ICD-10-CM | POA: Diagnosis not present

## 2022-10-24 DIAGNOSIS — M9901 Segmental and somatic dysfunction of cervical region: Secondary | ICD-10-CM | POA: Diagnosis not present

## 2022-10-24 DIAGNOSIS — M25512 Pain in left shoulder: Secondary | ICD-10-CM | POA: Diagnosis not present

## 2022-10-24 DIAGNOSIS — M25511 Pain in right shoulder: Secondary | ICD-10-CM | POA: Diagnosis not present

## 2022-11-06 DIAGNOSIS — R69 Illness, unspecified: Secondary | ICD-10-CM | POA: Diagnosis not present

## 2022-12-24 ENCOUNTER — Encounter: Payer: Self-pay | Admitting: Gynecologic Oncology

## 2022-12-26 ENCOUNTER — Ambulatory Visit (HOSPITAL_COMMUNITY)
Admission: RE | Admit: 2022-12-26 | Discharge: 2022-12-26 | Disposition: A | Discharge status: Home / Self Care | Payer: Medicare HMO | Source: Ambulatory Visit | Attending: Gynecologic Oncology | Admitting: Gynecologic Oncology

## 2022-12-26 DIAGNOSIS — N9489 Other specified conditions associated with female genital organs and menstrual cycle: Secondary | ICD-10-CM | POA: Diagnosis not present

## 2022-12-26 DIAGNOSIS — Z90710 Acquired absence of both cervix and uterus: Secondary | ICD-10-CM | POA: Diagnosis not present

## 2022-12-26 DIAGNOSIS — N83202 Unspecified ovarian cyst, left side: Secondary | ICD-10-CM | POA: Diagnosis not present

## 2022-12-26 DIAGNOSIS — K5730 Diverticulosis of large intestine without perforation or abscess without bleeding: Secondary | ICD-10-CM | POA: Diagnosis not present

## 2022-12-26 MED ORDER — GADOBUTROL 1 MMOL/ML IV SOLN
6.0000 mL | Freq: Once | INTRAVENOUS | Status: AC | PRN
  Administered 2022-12-26: 6 mL via INTRAVENOUS

## 2022-12-27 ENCOUNTER — Inpatient Hospital Stay: Payer: Medicare HMO | Admitting: Gynecologic Oncology

## 2022-12-27 ENCOUNTER — Encounter: Payer: Self-pay | Admitting: Gynecologic Oncology

## 2022-12-27 ENCOUNTER — Inpatient Hospital Stay: Payer: Medicare HMO | Attending: Gynecologic Oncology | Admitting: Gynecologic Oncology

## 2022-12-27 ENCOUNTER — Other Ambulatory Visit: Payer: Self-pay

## 2022-12-27 VITALS — BP 162/66 | HR 83 | Temp 98.6°F | Resp 14 | Wt 145.8 lb

## 2022-12-27 DIAGNOSIS — N9489 Other specified conditions associated with female genital organs and menstrual cycle: Secondary | ICD-10-CM

## 2022-12-27 DIAGNOSIS — Z8742 Personal history of other diseases of the female genital tract: Secondary | ICD-10-CM

## 2022-12-27 DIAGNOSIS — B3731 Acute candidiasis of vulva and vagina: Secondary | ICD-10-CM | POA: Diagnosis not present

## 2022-12-27 DIAGNOSIS — Z85528 Personal history of other malignant neoplasm of kidney: Secondary | ICD-10-CM

## 2022-12-27 DIAGNOSIS — K5790 Diverticulosis of intestine, part unspecified, without perforation or abscess without bleeding: Secondary | ICD-10-CM

## 2022-12-27 MED ORDER — FLUCONAZOLE 150 MG PO TABS: 150.0000 mg | ORAL_TABLET | Freq: Every day | ORAL | 0 refills | Status: DC

## 2022-12-27 NOTE — Progress Notes (Signed)
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 In 09/2021, she underwent robotic left radical nephrectomy. She then received 9 cycles of adjuvant IO with Pembrolizumab, completed in 06/2022.   CT of the chest and abdomen on 08/22/2022 shows stable exam post left nephrectomy without evidence of local recurrence.  No evidence of metastatic disease within the chest or abdomen.  Stable right renal lesions noted.  Partially visualized multiseptated 10.1 x 6.3 cm fluid density in the left anterior abdomen appearing to be in continuity with the sigmoid colon, visualized portions of which are slightly increased in comparison to prior CT scan.  In retrospect visible only on coronal sequences of images from prior MRI dated August 15, 2021.  On this MRI, thin internal enhancing septation without definitive soft tissue nodularity or wall septal thickening.  Stable small bilateral pulmonary nodules.  Left-sided colonic diverticulosis without findings of acute diverticulitis.   MRI of the pelvis on 09/22/2022 shows a 10.7 cm complex cystic lesion in the left adnexal region, suspicious for cystic ovarian neoplasm although no malignant features are visualized.  3.2 cm complex cystic lesion is seen in the right adnexa, again no malignant features identified.  No evidence of pelvic metastatic disease including no peritoneal thickening or adenopathy.  Severe sigmoid diverticulosis noted without  diverticulitis.   Her last colonoscopy was in 2017.  She was told at that time she needed follow-up in 7 years.  She was seen by GI last year and told that she could defer colonoscopy if she wanted.  She ultimately decided to defer colonoscopy.   Tumor markers 10/09/22: CEA - 1.30 CA-125 - 15.8   Interval History: Patient is overall doing very well.  She denies any abdominal or pelvic pain.  Reports baseline bowel bladder function.  Has been enjoying being at the beach, gained a couple of pounds.  After taking antibiotics for urinary tract infection at the end of last year, has had intermittent vaginitis that she think is related to a yeast infection.  Symptoms will improve with Monistat but then returned.  Past Medical/Surgical History: Past Medical History:  Diagnosis Date   Acute cystitis 09/26/2007   recurrent UTI's   Allergy    Atypical nevus 11/17/2002   Upper Center Back - Moderate   Atypical nevus 11/17/2002   Right Lower Abdomen - Moderate   Atypical nevus 04/23/2006   Left Upper Arm - Slight to Moderate   Atypical nevus 04/23/2006   Left Outer Lower Leg - Slight to Moderate   Atypical nevus 04/15/2012   Left Upper Buttock - Mild   Atypical nevus 08/25/2014   Right Inner Knee - Moderate   Atypical nevus 08/25/2014   Left Scapula - Severe   Basal cell carcinoma 08/25/2014   left axilla tx -curet and cautery   COUGH DUE TO ACE INHIBITORS 06/20/2009   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 Dr. 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   Right Buccal Cheek (in situ)([id number])   WEIGHT GAIN 06/08/2008    Past Surgical History:  Procedure Laterality Date   BREAST LUMPECTOMY  11/05/1974   benign   COLONOSCOPY     POLYPECTOMY     ROBOT ASSISTED LAPAROSCOPIC NEPHRECTOMY Left 09/27/2021   Procedure: XI ROBOTIC ASSISTED LAPAROSCOPIC RADICAL NEPHRECTOMY;  Surgeon: Ceasar Mons, MD;  Location: [facility];  Service: Urology;  Laterality: Left;   THYROID SURGERY  11/05/2009   left nodule removed   VAGINAL DELIVERY     x1   VAGINAL HYSTERECTOMY  11/05/1972    Family History  Problem Relation Age of Onset   Cancer Mother        Ovarian Cancer   Ovarian cancer Mother    Hypertension Mother    Thyroid disease Mother    Lymphoma Sister    Cancer Sister        Breast Cancer, Kidney Cancer   Leukemia Sister    Breast cancer Maternal Aunt    Colon cancer Neg Hx    Rectal cancer Neg Hx    Stomach cancer Neg Hx    Endometrial cancer Neg Hx    Prostate cancer Neg Hx    Pancreatic cancer Neg Hx     Social History   Socioeconomic History   Marital status: Married    Spouse name: Not on file   Number of children: Not on file   Years of education: Not on file   Highest education level: Not on file  Occupational History   Occupation: Homemaker    Employer: RETIRED  Tobacco Use   Smoking status: Never   Smokeless tobacco: Never  Vaping Use   Vaping Use: Never used  Substance and Sexual Activity   Alcohol use: Yes    Alcohol/week: 0.0 standard drinks of alcohol    Comment: socially-2 glasses a week   Drug use: Never   Sexual activity: Not Currently  Other Topics Concern   Not on file  Social History Narrative   Does not work outside the home   Social Determinants of Health   Financial Resource Strain: Low Risk  (03/22/2022)   Overall Financial Resource Strain (CARDIA)    Difficulty of Paying Living Expenses: Not hard  at all  Food Insecurity: No Food Insecurity (03/22/2022)   Hunger Vital Sign    Worried About Running Out of Food in the Last Year: Never true    Natural Steps in the Last Year: Never true  Transportation Needs: No Transportation Needs (03/22/2022)   PRAPARE - Hydrologist (Medical): No    Lack of Transportation (Non-Medical): No  Physical Activity: Sufficiently Active (03/22/2022)   Exercise Vital Sign    Days of Exercise per Week: 5 days    Minutes of Exercise per Session: 120 min  Stress: No Stress Concern Present (03/22/2022)   Black    Feeling of Stress : Not at all  Social Connections: Moderately Integrated (  03/22/2022)   Social Connection and Isolation Panel [NHANES]    Frequency of Communication with Friends and Family: More than three times a week    Frequency of Social Gatherings with Friends and Family: More than three times a week    Attends Religious Services: More than 4 times per year    Active Member of [employer] or Organizations: No    Attends Archivist Meetings: Never    Marital Status: Married    Current Medications:  Current Outpatient Medications:    calcium carbonate (OS-CAL - DOSED IN MG OF ELEMENTAL CALCIUM) 1250 MG tablet, Take 1 tablet by mouth every 14 (fourteen) days., Disp: , Rfl:    Cholecalciferol (VITAMIN D3) 2400 UNIT/ML LIQD, Take 2,400 Units by mouth daily., Disp: , Rfl:    EPINEPHrine 0.3 mg/0.3 mL IJ SOAJ injection, Inject 0.3 mg into the muscle as needed for anaphylaxis., Disp: , Rfl:    fluconazole (DIFLUCAN) 150 MG tablet, Take 1 tablet (150 mg total) by mouth daily. Take one tablet. If no improvement in symptoms, take second tablet 2-3 days later., Disp: 2 tablet, Rfl: 0   hydrocortisone 2.5 % cream, Apply topically 2 (two) times daily as needed., Disp: , Rfl:    olmesartan (BENICAR) 40 MG tablet, TAKE 1 TABLET BY MOUTH EVERY DAY, Disp: 90  tablet, Rfl: 3   rosuvastatin (CRESTOR) 5 MG tablet, TAKE 5MG BY MOUTH EVERY OTHER DAY, Disp: 45 tablet, Rfl: 3   SYMBICORT 80-4.5 MCG/ACT inhaler, Inhale 2 puffs into the lungs daily as needed (allergies)., Disp: , Rfl:    amoxicillin-clavulanate (AUGMENTIN) 875-125 MG tablet, Take 1 tablet by mouth 2 (two) times daily. (Patient not taking: Reported on 12/24/2022), Disp: 10 tablet, Rfl: 0  Review of Systems: Denies appetite changes, fevers, chills, fatigue, unexplained weight changes. Denies hearing loss, neck lumps or masses, mouth sores, ringing in ears or voice changes. Denies cough or wheezing.  Denies shortness of breath. Denies chest pain or palpitations. Denies leg swelling. Denies abdominal distention, pain, blood in stools, constipation, diarrhea, nausea, vomiting, or early satiety. Denies pain with intercourse, dysuria, frequency, hematuria or incontinence. Denies hot flashes, pelvic pain, vaginal bleeding or vaginal discharge.   Denies joint pain, back pain or muscle pain/cramps. Denies itching, rash, or wounds. Denies dizziness, headaches, numbness or seizures. Denies swollen lymph nodes or glands, denies easy bruising or bleeding. Denies anxiety, depression, confusion, or decreased concentration.  Physical Exam: BP (!) 162/66 (BP Location: Left Arm, Patient Position: Sitting) Comment: informed Dr Berline Lopes.  Patient states she is watching it at home; refused recheck on Right arm  Pulse 83   Temp 98.6 F (37 C) (Oral)   Resp 14   Wt 145 lb 12.8 oz (66.1 kg)   LMP  (LMP Unknown)   SpO2 100%   BMI 28.47 kg/m  General: Alert, oriented, no acute distress. HEENT: Normocephalic, atraumatic, sclera anicteric. Chest: Unlabored breathing on room air  Laboratory & Radiologic Studies: MRI pelvis 12/26/22: 1. 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 This remains worrisome for cystic ovarian neoplasm.  No solid component or suspicious contrast enhancement. 2. Unchanged multiseptated lesion of the right ovary, this with a thicker periphery of tissue and thin internal septations, measuring 2.9 x 2.2 cm. This lesion however is not significantly changed over a long period of time dating back to 11/12/2007 and is presumed benign given long-term stability. 3. No lymphadenopathy or metastatic  disease in the pelvis. 4. Severe descending and sigmoid diverticulosis without evidence of diverticulitis.  Assessment & Plan: Donna Andersen is a 82 y.o. woman with bilateral adnexal masses.   Patient overall continues to do very well and is asymptomatic.  We discussed MRI findings which show no significant change in either the right or left cystic adnexal masses.  They remained stable in size without significant findings that increase suspicion for malignancy.  Given stable findings on MRI, without imaging features that significantly raise my concern for malignancy, we discussed her upcoming cruise.  I think it would be reasonable to delay surgery from planned date in mid March until late April or early May when she is back from her cruise.  Cruise is to celebrate her 82th birthday in her 30th anniversary.  The patient voiced understanding of the fact that imaging is not diagnostic and that while overall reassuring, there is a risk that pelvic findings represent a malignancy.   She will return once back in late April for preoperative visit.  Prescription for Diflucan was sent into her pharmacy given intermittent vaginitis.  Second dose was sent if symptoms do not improve within 48-72 hours.  20 minutes of total time was spent for this patient encounter, including preparation, face-to-face counseling with the patient and coordination of care, and documentation of the encounter.  Jeral Pinch, MD  Division of Gynecologic Oncology  Department of Obstetrics and Gynecology  [facility]

## 2022-12-27 NOTE — Patient Instructions (Incomplete)
Preparing for your Surgery  Plan for surgery on January 16, 2023 with Dr. Jeral Pinch at [place] will be scheduled for robotic assisted laparoscopic bilateral salpingo-oophorectomy (removal of both ovaries and fallopian tubes), possible staging if a cancer or precancer is identified, possible laparotomy (larger incision on your abdomen if needed).   Pre-operative Testing -You will receive a phone call from presurgical testing at [facility] to arrange for a pre-operative appointment and lab work.  -Bring your insurance card, copy of an advanced directive if applicable, medication list  -At that visit, you will be asked to sign a consent for a possible blood transfusion in case a transfusion becomes necessary during surgery.  The need for a blood transfusion is rare but having consent is a necessary part of your care.     -You should not be taking blood thinners or aspirin at least ten days prior to surgery unless instructed by your surgeon.  -Do not take supplements such as fish oil (omega 3), red yeast rice, turmeric before your surgery. You want to avoid medications with aspirin in them including headache powders such as BC or Goody's), Excedrin migraine.  Day Before Surgery at [place] will be asked to take in a light diet the day before surgery. You will be advised you can have clear liquids up until 3 hours before your surgery.    Eat a light diet the day before surgery.  Examples including soups, broths, toast, yogurt, mashed potatoes.  AVOID GAS PRODUCING FOODS AND BEVERAGES. Things to avoid include carbonated beverages (fizzy beverages, sodas), raw fruits and raw vegetables (uncooked), or beans.   If your bowels are filled with gas, your surgeon will have difficulty visualizing your pelvic organs which increases your surgical risks.  Your role in recovery Your role is to become active as soon as directed by your doctor, while still giving yourself time to  heal.  Rest when you feel tired. You will be asked to do the following in order to speed your recovery:  - Cough and breathe deeply. This helps to clear and expand your lungs and can prevent pneumonia after surgery.  - [place]. Do mild physical activity. Walking or moving your legs help your circulation and body functions return to normal. Do not try to get up or walk alone the first time after surgery.   -If you develop swelling on one leg or the other, pain in the back of your leg, redness/warmth in one of your legs, please call the office or go to the Emergency Room to have a doppler to rule out a blood clot. For shortness of breath, chest pain-seek care in the Emergency Room as soon as possible. - Actively manage your pain. Managing your pain lets you move in comfort. We will ask you to rate your pain on a scale of zero to 10. It is your responsibility to tell your doctor or nurse where and how much you hurt so your pain can be treated.  Special Considerations -If you are diabetic, you may be placed on insulin after surgery to have closer control over your blood sugars to promote healing and recovery.  This does not mean that you will be discharged on insulin.  If applicable, your oral antidiabetics will be resumed when you are tolerating a solid diet.  -Your final pathology results from surgery should be available around one week after surgery and the results will be relayed to you when  available.  -FMLA forms can be faxed to [phone] and please allow 5-7 business days for completion.  Pain Management After Surgery -You have been prescribed your pain medication and bowel regimen medications before surgery so that you can have these available when you are discharged from the hospital. The pain medication is for use ONLY AFTER surgery and a new prescription will not be given.   -Make sure that you have Tylenol and Ibuprofen IF YOU ARE ABLE TO TAKE THESE MEDICATIONS at  home to use on a regular basis after surgery for pain control. We recommend alternating the medications every hour to six hours since they work differently and are processed in the body differently for pain relief.  -Review the attached handout on narcotic use and their risks and side effects.   Bowel Regimen -You have been prescribed Sennakot-S to take nightly to prevent constipation especially if you are taking the narcotic pain medication intermittently.  It is important to prevent constipation and drink adequate amounts of liquids. You can stop taking this medication when you are not taking pain medication and you are back on your normal bowel routine.  Risks of Surgery Risks of surgery are low but include bleeding, infection, damage to surrounding structures, re-operation, blood clots, and very rarely death.   Blood Transfusion Information (For the consent to be signed before surgery)  We will be checking your blood type before surgery so in case of emergencies, we will know what type of blood you would need.                                            WHAT IS A BLOOD TRANSFUSION?  A transfusion is the replacement of blood or some of its parts. Blood is made up of multiple cells which provide different functions. Red blood cells carry oxygen and are used for blood loss replacement. White blood cells fight against infection. Platelets control bleeding. Plasma helps clot blood. Other blood products are available for specialized needs, such as hemophilia or other clotting disorders. BEFORE THE TRANSFUSION  Who gives blood for transfusions?  You may be able to donate blood to be used at a later date on yourself (autologous donation). Relatives can be asked to donate blood. This is generally not any safer than if you have received blood from a stranger. The same precautions are taken to ensure safety when a relative's blood is donated. Healthy volunteers who are fully evaluated to make sure  their blood is safe. This is blood bank blood. Transfusion therapy is the safest it has ever been in the practice of medicine. Before blood is taken from a donor, a complete history is taken to make sure that person has no history of diseases nor engages in risky social behavior (examples are intravenous drug use or sexual activity with multiple partners). The donor's travel history is screened to minimize risk of transmitting infections, such as malaria. The donated blood is tested for signs of infectious diseases, such as HIV and hepatitis. The blood is then tested to be sure it is compatible with you in order to minimize the chance of a transfusion reaction. If you or a relative donates blood, this is often done in anticipation of surgery and is not appropriate for emergency situations. It takes many days to process the donated blood. RISKS AND COMPLICATIONS Although transfusion therapy is very safe and  saves many lives, the main dangers of transfusion include:  Getting an infectious disease. Developing a transfusion reaction. This is an allergic reaction to something in the blood you were given. Every precaution is taken to prevent this. The decision to have a blood transfusion has been considered carefully by your caregiver before blood is given. Blood is not given unless the benefits outweigh the risks.  AFTER SURGERY INSTRUCTIONS  Return to work: 4-6 weeks if applicable  Activity: 1. Be up and out of the bed during the day.  Take a nap if needed.  You may walk up steps but be careful and use the hand rail.  Stair climbing will tire you more than you think, you may need to stop part way and rest.   2. No lifting or straining for 6 weeks over 10 pounds. No pushing, pulling, straining for 6 weeks.  3. No driving for around 1 week(s).  Do not drive if you are taking narcotic pain medicine and make sure that your reaction time has returned.   4. You can shower as soon as the next day after  surgery. Shower daily.  Use your regular soap and water (not directly on the incision) and pat your incision(s) dry afterwards; don't rub.  No tub baths or submerging your body in water until cleared by your surgeon. If you have the soap that was given to you by pre-surgical testing that was used before surgery, you do not need to use it afterwards because this can irritate your incisions.   5. No sexual activity and nothing in the vagina for 4 weeks.  6. You may experience a small amount of clear drainage from your incisions, which is normal.  If the drainage persists, increases, or changes color please call the office.  7. Do not use creams, lotions, or ointments such as neosporin on your incisions after surgery until advised by your surgeon because they can cause removal of the dermabond glue on your incisions.    8. Take Tylenol or ibuprofen first for pain if you are able to take these medications and only use narcotic pain medication for severe pain not relieved by the Tylenol or Ibuprofen.  Monitor your Tylenol intake to a max of 4,000 mg in a 24 hour period. You can alternate these medications after surgery.  Diet: 1. Low sodium Heart Healthy Diet is recommended but you are cleared to resume your normal (before surgery) diet after your procedure.  2. It is safe to use a laxative, such as Miralax or Colace, if you have difficulty moving your bowels. You have been prescribed Sennakot-S to take at bedtime every evening after surgery to keep bowel movements regular and to prevent constipation.    Wound Care: 1. Keep clean and dry.  Shower daily.  Reasons to call the Doctor: Fever - Oral temperature greater than 100.4 degrees Fahrenheit Foul-smelling vaginal discharge Difficulty urinating Nausea and vomiting Increased pain at the site of the incision that is unrelieved with pain medicine. Difficulty breathing with or without chest pain New calf pain especially if only on one side Sudden,  continuing increased vaginal bleeding with or without clots.   Contacts: For questions or concerns you should contact:  Dr. Jeral Pinch at [phone]  Joylene John, NP at [phone]  After Hours: call [phone] and have the GYN Oncologist paged/contacted (after 5 pm or on the weekends). You will speak with an after hours RN and let he or she know you have had surgery.  Messages sent via mychart are for non-urgent matters and are not responded to after hours so for urgent needs, please call the after hours number.

## 2022-12-27 NOTE — Patient Instructions (Addendum)
We will tentatively plan for surgery at [facility] with Dr. Jeral Pinch on Mar 07, 2023. We will see you back in the office closer to the date for a preop appointment with Joylene John NP to discuss the instructions for before and after surgery.  You may also receive a phone call from the hospital to arrange for a pre-op appointment there as well. Usually both appointments can be combined on the same day.

## 2022-12-30 DIAGNOSIS — R69 Illness, unspecified: Secondary | ICD-10-CM | POA: Diagnosis not present

## 2023-01-08 DIAGNOSIS — R92323 Mammographic fibroglandular density, bilateral breasts: Secondary | ICD-10-CM | POA: Diagnosis not present

## 2023-01-08 DIAGNOSIS — Z1231 Encounter for screening mammogram for malignant neoplasm of breast: Secondary | ICD-10-CM | POA: Diagnosis not present

## 2023-01-08 LAB — HM MAMMOGRAPHY

## 2023-01-09 DIAGNOSIS — Z961 Presence of intraocular lens: Secondary | ICD-10-CM | POA: Diagnosis not present

## 2023-01-09 DIAGNOSIS — H5201 Hypermetropia, right eye: Secondary | ICD-10-CM | POA: Diagnosis not present

## 2023-01-09 DIAGNOSIS — D3131 Benign neoplasm of right choroid: Secondary | ICD-10-CM | POA: Diagnosis not present

## 2023-01-10 DIAGNOSIS — M7662 Achilles tendinitis, left leg: Secondary | ICD-10-CM | POA: Diagnosis not present

## 2023-01-10 DIAGNOSIS — M9906 Segmental and somatic dysfunction of lower extremity: Secondary | ICD-10-CM | POA: Diagnosis not present

## 2023-01-10 DIAGNOSIS — M9904 Segmental and somatic dysfunction of sacral region: Secondary | ICD-10-CM | POA: Diagnosis not present

## 2023-01-10 DIAGNOSIS — M25551 Pain in right hip: Secondary | ICD-10-CM | POA: Diagnosis not present

## 2023-01-10 DIAGNOSIS — M9905 Segmental and somatic dysfunction of pelvic region: Secondary | ICD-10-CM | POA: Diagnosis not present

## 2023-01-10 DIAGNOSIS — M25511 Pain in right shoulder: Secondary | ICD-10-CM | POA: Diagnosis not present

## 2023-01-10 DIAGNOSIS — M25512 Pain in left shoulder: Secondary | ICD-10-CM | POA: Diagnosis not present

## 2023-01-10 DIAGNOSIS — M9907 Segmental and somatic dysfunction of upper extremity: Secondary | ICD-10-CM | POA: Diagnosis not present

## 2023-01-10 DIAGNOSIS — M9902 Segmental and somatic dysfunction of thoracic region: Secondary | ICD-10-CM | POA: Diagnosis not present

## 2023-01-10 DIAGNOSIS — M25552 Pain in left hip: Secondary | ICD-10-CM | POA: Diagnosis not present

## 2023-01-11 ENCOUNTER — Encounter (HOSPITAL_COMMUNITY): Payer: Medicare HMO

## 2023-01-16 DIAGNOSIS — N9489 Other specified conditions associated with female genital organs and menstrual cycle: Secondary | ICD-10-CM

## 2023-01-23 ENCOUNTER — Telehealth: Payer: Medicare HMO | Admitting: Gynecologic Oncology

## 2023-01-23 DIAGNOSIS — Z0100 Encounter for examination of eyes and vision without abnormal findings: Secondary | ICD-10-CM | POA: Diagnosis not present

## 2023-01-28 ENCOUNTER — Other Ambulatory Visit: Payer: Self-pay | Admitting: Family Medicine

## 2023-01-30 DIAGNOSIS — M353 Polymyalgia rheumatica: Secondary | ICD-10-CM | POA: Diagnosis not present

## 2023-01-30 DIAGNOSIS — M9906 Segmental and somatic dysfunction of lower extremity: Secondary | ICD-10-CM | POA: Diagnosis not present

## 2023-01-30 DIAGNOSIS — M899 Disorder of bone, unspecified: Secondary | ICD-10-CM | POA: Diagnosis not present

## 2023-01-30 DIAGNOSIS — Z905 Acquired absence of kidney: Secondary | ICD-10-CM | POA: Diagnosis not present

## 2023-01-30 DIAGNOSIS — M9902 Segmental and somatic dysfunction of thoracic region: Secondary | ICD-10-CM | POA: Diagnosis not present

## 2023-01-30 DIAGNOSIS — M9908 Segmental and somatic dysfunction of rib cage: Secondary | ICD-10-CM | POA: Diagnosis not present

## 2023-01-30 DIAGNOSIS — M7662 Achilles tendinitis, left leg: Secondary | ICD-10-CM | POA: Diagnosis not present

## 2023-02-08 ENCOUNTER — Encounter: Payer: Medicare HMO | Admitting: Gynecologic Oncology

## 2023-02-11 DIAGNOSIS — J3081 Allergic rhinitis due to animal (cat) (dog) hair and dander: Secondary | ICD-10-CM | POA: Diagnosis not present

## 2023-02-11 DIAGNOSIS — T63451D Toxic effect of venom of hornets, accidental (unintentional), subsequent encounter: Secondary | ICD-10-CM | POA: Diagnosis not present

## 2023-02-11 DIAGNOSIS — J3089 Other allergic rhinitis: Secondary | ICD-10-CM | POA: Diagnosis not present

## 2023-02-11 DIAGNOSIS — J301 Allergic rhinitis due to pollen: Secondary | ICD-10-CM | POA: Diagnosis not present

## 2023-02-20 ENCOUNTER — Telehealth: Payer: Self-pay | Admitting: Family Medicine

## 2023-02-20 NOTE — Telephone Encounter (Signed)
Copied from CRM [id number]. Topic: Medicare AWV >> Feb 20, 2023 12:03 PM Gwenith Spitz wrote: Reason for CRM: Called patient to schedule Medicare Annual Wellness Visit (AWV). Left message for patient to call back and schedule Medicare Annual Wellness Visit (AWV).  Last date of AWV: 03/22/2022  Please schedule an appointment at any time with Inetta Fermo, [facility]. Please schedule AWVS with Inetta Fermo, [facility].  If any questions, please contact me at [phone].  Thank you ,  Gabriel Cirri [facility] AWV TEAM Direct Dial [phone]

## 2023-02-26 ENCOUNTER — Encounter: Payer: Self-pay | Admitting: Gynecologic Oncology

## 2023-02-26 NOTE — Patient Instructions (Addendum)
SURGICAL WAITING ROOM VISITATION  Patients having surgery or a procedure may have no more than 2 support people in the waiting area - these visitors may rotate.    Children under the age of 51 must have an adult with them who is not the patient.  Due to an increase in RSV and influenza rates and associated hospitalizations, children ages 41 and under may not visit patients in [facility] hospitals.  If the patient needs to stay at the hospital during part of their recovery, the visitor guidelines for inpatient rooms apply. Pre-op nurse will coordinate an appropriate time for 1 support person to accompany patient in pre-op.  This support person may not rotate.    Please refer to the [facility] website for the visitor guidelines for Inpatients (after your surgery is over and you are in a regular room).    Your procedure is scheduled on: 03/07/23   Report to [facility] Main Entrance    Report to admitting at 7:45 AM   Call this number if you have problems the morning of surgery [phone]   Follow a full liquid diet the day before surgery (avoid gas producing foods)   Do not eat food :After Midnight.   After Midnight you may have the following liquids until 7:00 AM DAY OF SURGERY  Water Non-Citrus Juices (without pulp, NO RED-Apple, White grape, White cranberry) Black Coffee (NO MILK/CREAM OR CREAMERS, sugar ok)  Clear Tea (NO MILK/CREAM OR CREAMERS, sugar ok) regular and decaf                             Plain Jell-O (NO RED)                                           Fruit ices (not with fruit pulp, NO RED)                                     Popsicles (NO RED)                                                               Sports drinks like Gatorade (NO RED)                      If you have questions, please contact your surgeon's office.   FOLLOW  BOWEL PREP AND ANY ADDITIONAL PRE OP INSTRUCTIONS YOU RECEIVED FROM YOUR SURGEON'S OFFICE!!!  -Follow Flagyl instructions  from surgeon's office   Switch to a full liquid diet the day before surgery Drink plenty of liquids all day to avoid getting dehydrated     2:00pm Take 2 Flagyl (total of 1000 mg) tablets   3:00pm Take 2 Flagyl (total of 1000 mg) tablets   10:00pm Take 2 Flagyl (total of 1000 mg) tablets   Oral Hygiene is also important to reduce your risk of infection.  Remember - BRUSH YOUR TEETH THE MORNING OF SURGERY WITH YOUR REGULAR TOOTHPASTE  DENTURES WILL BE REMOVED PRIOR TO SURGERY PLEASE DO NOT APPLY "Poly grip" OR ADHESIVES!!!   Do NOT smoke after Midnight   Take these medicines the morning of surgery with A SIP OF WATER:   Rosuvastatin  Okay to use inhalers  Bring CPAP mask and tubing day of surgery.                              You may not have any metal on your body including hair pins, jewelry, and body piercing             Do not wear make-up, lotions, powders, perfumes, or deodorant  Do not wear nail polish including gel and S&S, artificial/acrylic nails, or any other type of covering on natural nails including finger and toenails. If you have artificial nails, gel coating, etc. that needs to be removed by a nail salon please have this removed prior to surgery or surgery may need to be canceled/ delayed if the surgeon/ anesthesia feels like they are unable to be safely monitored.   Do not shave  48 hours prior to surgery.    Do not bring valuables to the hospital. [facility] IS NOT  RESPONSIBLE   FOR VALUABLES.   Contacts, glasses, dentures or bridgework may not be worn into surgery.  DO NOT BRING YOUR HOME MEDICATIONS TO THE HOSPITAL. PHARMACY WILL DISPENSE MEDICATIONS LISTED ON YOUR MEDICATION LIST TO YOU DURING YOUR ADMISSION IN THE HOSPITAL!    Patients discharged on the day of surgery will not be allowed to drive home.  Someone NEEDS to stay with you for the first 24 hours after anesthesia.   Special Instructions: Bring a copy of  your healthcare power of attorney and living will documents the day of surgery if you haven't scanned them before.              Please read over the following fact sheets you were given: IF YOU HAVE QUESTIONS ABOUT YOUR PRE-OP INSTRUCTIONS PLEASE CALL [phone] [facility]   If you received a COVID test during your pre-op visit  it is requested that you wear a mask when out in public, stay away from anyone that may not be feeling well and notify your surgeon if you develop symptoms. If you test positive for Covid or have been in contact with anyone that has tested positive in the last 10 days please notify you surgeon.    [place] - Preparing for Surgery Before surgery, you can play an important role.  Because skin is not sterile, your skin needs to be as free of germs as possible.  You can reduce the number of germs on your skin by washing with CHG (chlorahexidine gluconate) soap before surgery.  CHG is an antiseptic cleaner which kills germs and bonds with the skin to continue killing germs even after washing. Please DO NOT use if you have an allergy to CHG or antibacterial soaps.  If your skin becomes reddened/irritated stop using the CHG and inform your nurse when you arrive at Short Stay. Do not shave (including legs and underarms) for at least 48 hours prior to the first CHG shower.  You may shave your face/neck.  Please follow these instructions carefully:  1.  Shower with CHG Soap the night before surgery and the  morning of surgery.  2.  If you choose to  wash your hair, wash your hair first as usual with your normal  shampoo.  3.  After you shampoo, rinse your hair and body thoroughly to remove the shampoo.                             4.  Use CHG as you would any other liquid soap.  You can apply chg directly to the skin and wash.  Gently with a scrungie or clean washcloth.  5.  Apply the CHG Soap to your body ONLY FROM THE NECK DOWN.   Do   not use on face/ open                            Wound or open sores. Avoid contact with eyes, ears mouth and   genitals (private parts).                       Wash face,  Genitals (private parts) with your normal soap.             6.  Wash thoroughly, paying special attention to the area where your    surgery  will be performed.  7.  Thoroughly rinse your body with warm water from the neck down.  8.  DO NOT shower/wash with your normal soap after using and rinsing off the CHG Soap.                9.  Pat yourself dry with a clean towel.            10.  Wear clean pajamas.            11.  Place clean sheets on your bed the night of your first shower and do not  sleep with pets. Day of Surgery : Do not apply any lotions/deodorants the morning of surgery.  Please wear clean clothes to the hospital/surgery center.  FAILURE TO FOLLOW THESE INSTRUCTIONS MAY RESULT IN THE CANCELLATION OF YOUR SURGERY  PATIENT SIGNATURE_________________________________  NURSE SIGNATURE__________________________________   WHAT IS A BLOOD TRANSFUSION? Blood Transfusion Information  A transfusion is the replacement of blood or some of its parts. Blood is made up of multiple cells which provide different functions. Red blood cells carry oxygen and are used for blood loss replacement. White blood cells fight against infection. Platelets control bleeding. Plasma helps clot blood. Other blood products are available for specialized needs, such as hemophilia or other clotting disorders. BEFORE THE TRANSFUSION  Who gives blood for transfusions?  Healthy volunteers who are fully evaluated to make sure their blood is safe. This is blood bank blood. Transfusion therapy is the safest it has ever been in the practice of medicine. Before blood is taken from a donor, a complete history is taken to make sure that person has no history of diseases nor engages in risky social behavior (examples are intravenous drug use or sexual activity with multiple partners). The donor's  travel history is screened to minimize risk of transmitting infections, such as malaria. The donated blood is tested for signs of infectious diseases, such as HIV and hepatitis. The blood is then tested to be sure it is compatible with you in order to minimize the chance of a transfusion reaction. If you or a relative donates blood, this is often done in anticipation of surgery and is not appropriate for emergency situations. It takes many  days to process the donated blood. RISKS AND COMPLICATIONS Although transfusion therapy is very safe and saves many lives, the main dangers of transfusion include:  Getting an infectious disease. Developing a transfusion reaction. This is an allergic reaction to something in the blood you were given. Every precaution is taken to prevent this. The decision to have a blood transfusion has been considered carefully by your caregiver before blood is given. Blood is not given unless the benefits outweigh the risks. AFTER THE TRANSFUSION Right after receiving a blood transfusion, you will usually feel much better and more energetic. This is especially true if your red blood cells have gotten low (anemic). The transfusion raises the level of the red blood cells which carry oxygen, and this usually causes an energy increase. The nurse administering the transfusion will monitor you carefully for complications. HOME CARE INSTRUCTIONS  No special instructions are needed after a transfusion. You may find your energy is better. Speak with your caregiver about any limitations on activity for underlying diseases you may have. SEEK MEDICAL CARE IF:  Your condition is not improving after your transfusion. You develop redness or irritation at the intravenous (IV) site. SEEK IMMEDIATE MEDICAL CARE IF:  Any of the following symptoms occur over the next 12 hours: Shaking chills. You have a temperature by mouth above 102 F (38.9 C), not controlled by medicine. Chest, back, or muscle  pain. People around you feel you are not acting correctly or are confused. Shortness of breath or difficulty breathing. Dizziness and fainting. You get a rash or develop hives. You have a decrease in urine output. Your urine turns a dark color or changes to pink, red, or brown. Any of the following symptoms occur over the next 10 days: You have a temperature by mouth above 102 F (38.9 C), not controlled by medicine. Shortness of breath. Weakness after normal activity. The white part of the eye turns yellow (jaundice). You have a decrease in the amount of urine or are urinating less often. Your urine turns a dark color or changes to pink, red, or brown. Document Released: 10/19/2000 Document Revised: 01/14/2012 Document Reviewed: 06/07/2008 [facility] Patient Information 2014 [place], [place].

## 2023-02-27 NOTE — Progress Notes (Addendum)
COVID Vaccine Completed:  Yes  Date of COVID positive in last 90 days:  PCP - Asencion Partridge, MD Cardiologist -   Chest x-ray -  EKG -  Stress Test -  ECHO -  Cardiac Cath -  Pacemaker/ICD device last checked: Spinal Cord Stimulator:  Bowel Prep -  Follow instructions for Flagyl   Sleep Study -  CPAP -   Fasting Blood Sugar -  Checks Blood Sugar _____ times a day  Last dose of GLP1 agonist-  N/A GLP1 instructions:  N/A   Last dose of SGLT-2 inhibitors-  N/A SGLT-2 instructions: N/A   Blood Thinner Instructions:  Time Aspirin Instructions: Last Dose:  Activity level:  Can go up a flight of stairs and perform activities of daily living without stopping and without symptoms of chest pain or shortness of breath.  Able to exercise without symptoms  Unable to go up a flight of stairs without symptoms of     Anesthesia review:   Patient denies shortness of breath, fever, cough and chest pain at PAT appointment  Patient verbalized understanding of instructions that were given to them at the PAT appointment. Patient was also instructed that they will need to review over the PAT instructions again at home before surgery.

## 2023-03-01 ENCOUNTER — Encounter: Payer: Self-pay | Admitting: Gynecologic Oncology

## 2023-03-01 ENCOUNTER — Inpatient Hospital Stay: Payer: Medicare HMO | Attending: Gynecologic Oncology | Admitting: Gynecologic Oncology

## 2023-03-01 ENCOUNTER — Inpatient Hospital Stay (HOSPITAL_BASED_OUTPATIENT_CLINIC_OR_DEPARTMENT_OTHER): Payer: Medicare HMO | Admitting: Gynecologic Oncology

## 2023-03-01 VITALS — BP 166/70 | HR 72 | Temp 98.5°F | Resp 16 | Ht 60.0 in | Wt 143.3 lb

## 2023-03-01 DIAGNOSIS — D398 Neoplasm of uncertain behavior of other specified female genital organs: Secondary | ICD-10-CM | POA: Insufficient documentation

## 2023-03-01 DIAGNOSIS — Z90710 Acquired absence of both cervix and uterus: Secondary | ICD-10-CM

## 2023-03-01 DIAGNOSIS — N9489 Other specified conditions associated with female genital organs and menstrual cycle: Secondary | ICD-10-CM

## 2023-03-01 DIAGNOSIS — Z85528 Personal history of other malignant neoplasm of kidney: Secondary | ICD-10-CM | POA: Insufficient documentation

## 2023-03-01 DIAGNOSIS — Z905 Acquired absence of kidney: Secondary | ICD-10-CM | POA: Insufficient documentation

## 2023-03-01 DIAGNOSIS — Z8742 Personal history of other diseases of the female genital tract: Secondary | ICD-10-CM

## 2023-03-01 MED ORDER — METRONIDAZOLE 500 MG PO TABS
ORAL_TABLET | ORAL | 0 refills | Status: DC
Start: 2023-03-06 — End: 2023-04-10

## 2023-03-01 MED ORDER — SENNOSIDES-DOCUSATE SODIUM 8.6-50 MG PO TABS
2.0000 | ORAL_TABLET | Freq: Every day | ORAL | 0 refills | Status: DC
Start: 2023-03-01 — End: 2023-09-27

## 2023-03-01 MED ORDER — TRAMADOL HCL 50 MG PO TABS
50.0000 mg | ORAL_TABLET | Freq: Four times a day (QID) | ORAL | 0 refills | Status: DC | PRN
Start: 2023-03-01 — End: 2023-05-10

## 2023-03-01 NOTE — Progress Notes (Signed)
Patient here for follow up with Dr. Pricilla Holm and for a pre-operative appointment prior to her scheduled surgery on Mar 07, 2023. She is scheduled for robo BSO, poss stag, poss lap, poss bowel surgery.  She has her pre-admission testing appointment on Monday at [facility].  The surgery was discussed in detail.  See after visit summary for additional details. Visual aids used to discuss items related to surgery including the incentive spirometer, sequential compression stockings, foley catheter, IV pump, multi-modal pain regimen including tylenol, photo of the surgical robot, female reproductive system to discuss surgery in detail.      Discussed post-op pain management in detail including the aspects of the enhanced recovery pathway.  Advised her that a new prescription would be sent in for tramadol and it is only to be used for after her upcoming surgery.  We discussed the use of tylenol post-op and to monitor for a maximum of 4,000 mg in a 24 hour period.  Also prescribed sennakot to be used after surgery and to hold if having loose stools.  Discussed bowel regimen in detail.     Discussed the use of SCDs and measures to take at home to prevent DVT including frequent mobility.  Reportable signs and symptoms of DVT discussed. Post-operative instructions discussed and expectations for after surgery. Incisional care discussed as well including reportable signs and symptoms including erythema, drainage, wound separation.     10 minutes spent with the patient and preparing information.  Verbalizing understanding of material discussed. No needs or concerns voiced at the end of the visit.   Advised patient to call for any needs.  Advised that her pre-op flagyl and post-operative medications had been prescribed and could be picked up at any time.    This appointment is included in the global surgical bundle as pre-operative teaching and has no charge.

## 2023-03-01 NOTE — Patient Instructions (Signed)
Preparing for your Surgery  Plan for surgery on Mar 07, 2023 with Dr. Eugene Garnet at [facility]. You will be scheduled for robotic assisted laparoscopic bilateral salpingo-oophorectomy (removal of both ovaries and fallopian tubes), possible staging, possible laparotomy, possible bowel surgery.   Pre-operative Testing -(Done on 03/04/23) You will receive a phone call from presurgical testing at [facility] to arrange for a pre-operative appointment and lab work.  -Bring your insurance card, copy of an advanced directive if applicable, medication list  -At that visit, you will be asked to sign a consent for a possible blood transfusion in case a transfusion becomes necessary during surgery.  The need for a blood transfusion is rare but having consent is a necessary part of your care.     -You should not be taking blood thinners or aspirin at least ten days prior to surgery unless instructed by your surgeon.  -Do not take supplements such as fish oil (omega 3), red yeast rice, turmeric before your surgery. You want to avoid medications with aspirin in them including headache powders such as BC or Goody's), Excedrin migraine.  Day Before Surgery at Home -FOLLOW BOWEL PREP INSTRUCTIONS AT END OF HANDOUT. You will be advised you can have clear liquids up until 3 hours before your surgery.    AVOID GAS PRODUCING FOODS AND BEVERAGES. Things to avoid include carbonated beverages (fizzy beverages, sodas).  If your bowels are filled with gas, your surgeon will have difficulty visualizing your pelvic organs which increases your surgical risks.  Your role in recovery Your role is to become active as soon as directed by your doctor, while still giving yourself time to heal.  Rest when you feel tired. You will be asked to do the following in order to speed your recovery:  - Cough and breathe deeply. This helps to clear and expand your lungs and can prevent pneumonia after surgery.  -  STAY ACTIVE WHEN YOU GET HOME. Do mild physical activity. Walking or moving your legs help your circulation and body functions return to normal. Do not try to get up or walk alone the first time after surgery.   -If you develop swelling on one leg or the other, pain in the back of your leg, redness/warmth in one of your legs, please call the office or go to the Emergency Room to have a doppler to rule out a blood clot. For shortness of breath, chest pain-seek care in the Emergency Room as soon as possible. - Actively manage your pain. Managing your pain lets you move in comfort. We will ask you to rate your pain on a scale of zero to 10. It is your responsibility to tell your doctor or nurse where and how much you hurt so your pain can be treated.  Special Considerations -If you are diabetic, you may be placed on insulin after surgery to have closer control over your blood sugars to promote healing and recovery.  This does not mean that you will be discharged on insulin.  If applicable, your oral antidiabetics will be resumed when you are tolerating a solid diet.  -Your final pathology results from surgery should be available around one week after surgery and the results will be relayed to you when available.  -FMLA forms can be faxed to [phone] and please allow 5-7 business days for completion.  Pain Management After Surgery -You have been prescribed your pain medication and bowel regimen medications before surgery so that you can have these  available when you are discharged from the hospital. The pain medication is for use ONLY AFTER surgery and a new prescription will not be given.   -Make sure that you have Tylenol IF YOU ARE ABLE TO TAKE THESE MEDICATION at home to use on a regular basis after surgery for pain control.   -Review the attached handout on narcotic use and their risks and side effects.   Bowel Regimen -You have been prescribed Sennakot-S to take nightly to prevent  constipation especially if you are taking the narcotic pain medication intermittently.  It is important to prevent constipation and drink adequate amounts of liquids. You can stop taking this medication when you are not taking pain medication and you are back on your normal bowel routine.  Risks of Surgery Risks of surgery are low but include bleeding, infection, damage to surrounding structures, re-operation, blood clots, and very rarely death.   Blood Transfusion Information (For the consent to be signed before surgery)  We will be checking your blood type before surgery so in case of emergencies, we will know what type of blood you would need.                                            WHAT IS A BLOOD TRANSFUSION?  A transfusion is the replacement of blood or some of its parts. Blood is made up of multiple cells which provide different functions. Red blood cells carry oxygen and are used for blood loss replacement. White blood cells fight against infection. Platelets control bleeding. Plasma helps clot blood. Other blood products are available for specialized needs, such as hemophilia or other clotting disorders. BEFORE THE TRANSFUSION  Who gives blood for transfusions?  You may be able to donate blood to be used at a later date on yourself (autologous donation). Relatives can be asked to donate blood. This is generally not any safer than if you have received blood from a stranger. The same precautions are taken to ensure safety when a relative's blood is donated. Healthy volunteers who are fully evaluated to make sure their blood is safe. This is blood bank blood. Transfusion therapy is the safest it has ever been in the practice of medicine. Before blood is taken from a donor, a complete history is taken to make sure that person has no history of diseases nor engages in risky social behavior (examples are intravenous drug use or sexual activity with multiple partners). The donor's travel  history is screened to minimize risk of transmitting infections, such as malaria. The donated blood is tested for signs of infectious diseases, such as HIV and hepatitis. The blood is then tested to be sure it is compatible with you in order to minimize the chance of a transfusion reaction. If you or a relative donates blood, this is often done in anticipation of surgery and is not appropriate for emergency situations. It takes many days to process the donated blood. RISKS AND COMPLICATIONS Although transfusion therapy is very safe and saves many lives, the main dangers of transfusion include:  Getting an infectious disease. Developing a transfusion reaction. This is an allergic reaction to something in the blood you were given. Every precaution is taken to prevent this. The decision to have a blood transfusion has been considered carefully by your caregiver before blood is given. Blood is not given unless the benefits outweigh the risks.  AFTER SURGERY INSTRUCTIONS  Return to work: 4-6 weeks if applicable  Activity: 1. Be up and out of the bed during the day.  Take a nap if needed.  You may walk up steps but be careful and use the hand rail.  Stair climbing will tire you more than you think, you may need to stop part way and rest.   2. No lifting or straining for 6 weeks over 10 pounds. No pushing, pulling, straining for 6 weeks.  3. No driving for 4-09 days.  Do not drive if you are taking narcotic pain medicine and make sure that your reaction time has returned.   4. You can shower as soon as the next day after surgery. Shower daily.  Use your regular soap and water (not directly on the incision) and pat your incision(s) dry afterwards; don't rub.  No tub baths or submerging your body in water until cleared by your surgeon. If you have the soap that was given to you by pre-surgical testing that was used before surgery, you do not need to use it afterwards because this can irritate your  incisions.   5. No sexual activity and nothing in the vagina for 4-6 weeks.  6. You may experience a small amount of clear drainage from your incisions, which is normal.  If the drainage persists, increases, or changes color please call the office.  7. Do not use creams, lotions, or ointments such as neosporin on your incisions after surgery until advised by your surgeon because they can cause removal of the dermabond glue on your incisions.    8. Take Tylenol first for pain if you are able to take these medication and only use narcotic pain medication for severe pain not relieved by the Tylenol.  Monitor your Tylenol intake to a max of 4,000 mg in a 24 hour period.   Diet: 1. Low sodium Heart Healthy Diet is recommended but you are cleared to resume your normal (before surgery) diet after your procedure.  2. It is safe to use a laxative, such as Miralax or Colace, if you have difficulty moving your bowels. You have been prescribed Sennakot-S to take at bedtime every evening after surgery to keep bowel movements regular and to prevent constipation.    Wound Care: 1. Keep clean and dry.  Shower daily.  Reasons to call the Doctor: Fever - Oral temperature greater than 100.4 degrees Fahrenheit Foul-smelling vaginal discharge Difficulty urinating Nausea and vomiting Increased pain at the site of the incision that is unrelieved with pain medicine. Difficulty breathing with or without chest pain New calf pain especially if only on one side Sudden, continuing increased vaginal bleeding with or without clots.   Contacts: For questions or concerns you should contact:  Dr. Eugene Garnet at [phone]  Warner Mccreedy, NP at [phone]  After Hours: call [phone] and have the GYN Oncologist paged/contacted (after 5 pm or on the weekends). You will speak with an after hours RN and let he or she know you have had surgery.  Messages sent via mychart are for non-urgent matters and are  not responded to after hours so for urgent needs, please call the after hours number.  COLON BOWEL PREP     FIVE DAYS PRIOR TO YOUR SURGERY   Obtain supplies for the bowel prep at a pharmacy of your choice: Office sent prescription for your antibiotic pills (Flagyl)    Change your diet to make the bowel prep go more easily: Switch to a  bland, low fiber diet Stop eating any nuts, popcorn, or fruit with seeds.  Stop all fiber supplements such as Metamucil, Miralax, etc.     Improve nutrition: Consider drinking 2-3 nutritional shakes (Ex: Ensure Surgery) every day, starting 5 days prior to surgery     DAY PRIOR TO SURGERY    Switch to a full liquid diet the day before surgery Drink plenty of liquids all day to avoid getting dehydrated     2:00pm Take 2 Flagyl (total of 1000 mg) tablets   3:00pm Take 2 Flagyl (total of 1000 mg) tablets   10:00pm Take 2 Flagyl (total of 1000 mg) tablets  Do not eat anything solid after bedtime (midnight) the night before your surgery.   BUT DO drink plenty of clear liquids (Water, Gatorade, juice, soda, coffee, tea, broths, etc.) up to 3 hours prior to surgery to avoid getting dehydrated.      MORNING OF SURGERY   Remember to not to eat anything solid that morning  You can have clear liquids up until 3 hours before Hold or take medications as recommended by the hospital staff at your Preoperative visit Stop drinking liquids before you leave the house (>3 hours prior to surgery)

## 2023-03-01 NOTE — Patient Instructions (Signed)
Preparing for your Surgery  Plan for surgery on Mar 07, 2023 with Dr. Katherine Tucker at [facility]. You will be scheduled for robotic assisted laparoscopic bilateral salpingo-oophorectomy (removal of both ovaries and fallopian tubes), possible staging, possible laparotomy, possible bowel surgery.   Pre-operative Testing -(Done on 03/04/23) You will receive a phone call from presurgical testing at [facility] to arrange for a pre-operative appointment and lab work.  -Bring your insurance card, copy of an advanced directive if applicable, medication list  -At that visit, you will be asked to sign a consent for a possible blood transfusion in case a transfusion becomes necessary during surgery.  The need for a blood transfusion is rare but having consent is a necessary part of your care.     -You should not be taking blood thinners or aspirin at least ten days prior to surgery unless instructed by your surgeon.  -Do not take supplements such as fish oil (omega 3), red yeast rice, turmeric before your surgery. You want to avoid medications with aspirin in them including headache powders such as BC or Goody's), Excedrin migraine.  Day Before Surgery at Home -FOLLOW BOWEL PREP INSTRUCTIONS AT END OF HANDOUT. You will be advised you can have clear liquids up until 3 hours before your surgery.    AVOID GAS PRODUCING FOODS AND BEVERAGES. Things to avoid include carbonated beverages (fizzy beverages, sodas).  If your bowels are filled with gas, your surgeon will have difficulty visualizing your pelvic organs which increases your surgical risks.  Your role in recovery Your role is to become active as soon as directed by your doctor, while still giving yourself time to heal.  Rest when you feel tired. You will be asked to do the following in order to speed your recovery:  - Cough and breathe deeply. This helps to clear and expand your lungs and can prevent pneumonia after surgery.  -  STAY ACTIVE WHEN YOU GET HOME. Do mild physical activity. Walking or moving your legs help your circulation and body functions return to normal. Do not try to get up or walk alone the first time after surgery.   -If you develop swelling on one leg or the other, pain in the back of your leg, redness/warmth in one of your legs, please call the office or go to the Emergency Room to have a doppler to rule out a blood clot. For shortness of breath, chest pain-seek care in the Emergency Room as soon as possible. - Actively manage your pain. Managing your pain lets you move in comfort. We will ask you to rate your pain on a scale of zero to 10. It is your responsibility to tell your doctor or nurse where and how much you hurt so your pain can be treated.  Special Considerations -If you are diabetic, you may be placed on insulin after surgery to have closer control over your blood sugars to promote healing and recovery.  This does not mean that you will be discharged on insulin.  If applicable, your oral antidiabetics will be resumed when you are tolerating a solid diet.  -Your final pathology results from surgery should be available around one week after surgery and the results will be relayed to you when available.  -FMLA forms can be faxed to [phone] and please allow 5-7 business days for completion.  Pain Management After Surgery -You have been prescribed your pain medication and bowel regimen medications before surgery so that you can have these   available when you are discharged from the hospital. The pain medication is for use ONLY AFTER surgery and a new prescription will not be given.   -Make sure that you have Tylenol IF YOU ARE ABLE TO TAKE THESE MEDICATION at home to use on a regular basis after surgery for pain control.   -Review the attached handout on narcotic use and their risks and side effects.   Bowel Regimen -You have been prescribed Sennakot-S to take nightly to prevent  constipation especially if you are taking the narcotic pain medication intermittently.  It is important to prevent constipation and drink adequate amounts of liquids. You can stop taking this medication when you are not taking pain medication and you are back on your normal bowel routine.  Risks of Surgery Risks of surgery are low but include bleeding, infection, damage to surrounding structures, re-operation, blood clots, and very rarely death.   Blood Transfusion Information (For the consent to be signed before surgery)  We will be checking your blood type before surgery so in case of emergencies, we will know what type of blood you would need.                                            WHAT IS A BLOOD TRANSFUSION?  A transfusion is the replacement of blood or some of its parts. Blood is made up of multiple cells which provide different functions. Red blood cells carry oxygen and are used for blood loss replacement. White blood cells fight against infection. Platelets control bleeding. Plasma helps clot blood. Other blood products are available for specialized needs, such as hemophilia or other clotting disorders. BEFORE THE TRANSFUSION  Who gives blood for transfusions?  You may be able to donate blood to be used at a later date on yourself (autologous donation). Relatives can be asked to donate blood. This is generally not any safer than if you have received blood from a stranger. The same precautions are taken to ensure safety when a relative's blood is donated. Healthy volunteers who are fully evaluated to make sure their blood is safe. This is blood bank blood. Transfusion therapy is the safest it has ever been in the practice of medicine. Before blood is taken from a donor, a complete history is taken to make sure that person has no history of diseases nor engages in risky social behavior (examples are intravenous drug use or sexual activity with multiple partners). The donor's travel  history is screened to minimize risk of transmitting infections, such as malaria. The donated blood is tested for signs of infectious diseases, such as HIV and hepatitis. The blood is then tested to be sure it is compatible with you in order to minimize the chance of a transfusion reaction. If you or a relative donates blood, this is often done in anticipation of surgery and is not appropriate for emergency situations. It takes many days to process the donated blood. RISKS AND COMPLICATIONS Although transfusion therapy is very safe and saves many lives, the main dangers of transfusion include:  Getting an infectious disease. Developing a transfusion reaction. This is an allergic reaction to something in the blood you were given. Every precaution is taken to prevent this. The decision to have a blood transfusion has been considered carefully by your caregiver before blood is given. Blood is not given unless the benefits outweigh the risks.    AFTER SURGERY INSTRUCTIONS  Return to work: 4-6 weeks if applicable  Activity: 1. Be up and out of the bed during the day.  Take a nap if needed.  You may walk up steps but be careful and use the hand rail.  Stair climbing will tire you more than you think, you may need to stop part way and rest.   2. No lifting or straining for 6 weeks over 10 pounds. No pushing, pulling, straining for 6 weeks.  3. No driving for 5-10 days.  Do not drive if you are taking narcotic pain medicine and make sure that your reaction time has returned.   4. You can shower as soon as the next day after surgery. Shower daily.  Use your regular soap and water (not directly on the incision) and pat your incision(s) dry afterwards; don't rub.  No tub baths or submerging your body in water until cleared by your surgeon. If you have the soap that was given to you by pre-surgical testing that was used before surgery, you do not need to use it afterwards because this can irritate your  incisions.   5. No sexual activity and nothing in the vagina for 4-6 weeks.  6. You may experience a small amount of clear drainage from your incisions, which is normal.  If the drainage persists, increases, or changes color please call the office.  7. Do not use creams, lotions, or ointments such as neosporin on your incisions after surgery until advised by your surgeon because they can cause removal of the dermabond glue on your incisions.    8. Take Tylenol first for pain if you are able to take these medication and only use narcotic pain medication for severe pain not relieved by the Tylenol.  Monitor your Tylenol intake to a max of 4,000 mg in a 24 hour period.   Diet: 1. Low sodium Heart Healthy Diet is recommended but you are cleared to resume your normal (before surgery) diet after your procedure.  2. It is safe to use a laxative, such as Miralax or Colace, if you have difficulty moving your bowels. You have been prescribed Sennakot-S to take at bedtime every evening after surgery to keep bowel movements regular and to prevent constipation.    Wound Care: 1. Keep clean and dry.  Shower daily.  Reasons to call the Doctor: Fever - Oral temperature greater than 100.4 degrees Fahrenheit Foul-smelling vaginal discharge Difficulty urinating Nausea and vomiting Increased pain at the site of the incision that is unrelieved with pain medicine. Difficulty breathing with or without chest pain New calf pain especially if only on one side Sudden, continuing increased vaginal bleeding with or without clots.   Contacts: For questions or concerns you should contact:  Dr. Katherine Tucker at [phone]  Millard Bautch, NP at [phone]  After Hours: call [phone] and have the GYN Oncologist paged/contacted (after 5 pm or on the weekends). You will speak with an after hours RN and let he or she know you have had surgery.  Messages sent via mychart are for non-urgent matters and are  not responded to after hours so for urgent needs, please call the after hours number.  COLON BOWEL PREP     FIVE DAYS PRIOR TO YOUR SURGERY   Obtain supplies for the bowel prep at a pharmacy of your choice: Office sent prescription for your antibiotic pills (Flagyl)    Change your diet to make the bowel prep go more easily: Switch to a   bland, low fiber diet Stop eating any nuts, popcorn, or fruit with seeds.  Stop all fiber supplements such as Metamucil, Miralax, etc.     Improve nutrition: Consider drinking 2-3 nutritional shakes (Ex: Ensure Surgery) every day, starting 5 days prior to surgery     DAY PRIOR TO SURGERY    Switch to a full liquid diet the day before surgery Drink plenty of liquids all day to avoid getting dehydrated     2:00pm Take 2 Flagyl (total of 1000 mg) tablets   3:00pm Take 2 Flagyl (total of 1000 mg) tablets   10:00pm Take 2 Flagyl (total of 1000 mg) tablets  Do not eat anything solid after bedtime (midnight) the night before your surgery.   BUT DO drink plenty of clear liquids (Water, Gatorade, juice, soda, coffee, tea, broths, etc.) up to 3 hours prior to surgery to avoid getting dehydrated.      MORNING OF SURGERY   Remember to not to eat anything solid that morning  You can have clear liquids up until 3 hours before Hold or take medications as recommended by the hospital staff at your Preoperative visit Stop drinking liquids before you leave the house (>3 hours prior to surgery)      

## 2023-03-01 NOTE — Progress Notes (Signed)
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 In 09/2021, she underwent robotic left radical nephrectomy. She then received 9 cycles of adjuvant IO with Pembrolizumab, completed in 06/2022.   CT of the chest and abdomen on 08/22/2022 shows stable exam post left nephrectomy without evidence of local recurrence.  No evidence of metastatic disease within the chest or abdomen.  Stable right renal lesions noted.  Partially visualized multiseptated 10.1 x 6.3 cm fluid density in the left anterior abdomen appearing to be in continuity with the sigmoid colon, visualized portions of which are slightly increased in comparison to prior CT scan.  In retrospect visible only on coronal sequences of images from prior MRI dated August 15, 2021.  On this MRI, thin internal enhancing septation without definitive soft tissue nodularity or wall septal thickening.  Stable small bilateral pulmonary nodules.  Left-sided colonic diverticulosis without findings of acute diverticulitis.   MRI of the pelvis on 09/22/2022 shows a 10.7 cm complex cystic lesion in the left adnexal region, suspicious for cystic ovarian neoplasm although no malignant features are visualized.  3.2 cm complex cystic lesion is seen in the right adnexa, again no malignant features identified.  No evidence of pelvic metastatic disease including no peritoneal thickening or adenopathy.  Severe sigmoid diverticulosis noted  without diverticulitis.   Her last colonoscopy was in 2017.  She was told at that time she needed follow-up in 7 years.  She was seen by GI last year and told that she could defer colonoscopy if she wanted.  She ultimately decided to defer colonoscopy.   Tumor markers 10/09/22: CEA - 1.30 CA-125 - 15.8  Interval History: Patient reports overall doing well.  She and her husband had a wonderful time on their cruise visiting [place], [place], and [place].  She denies any change to abdominal symptoms.  Reports normal bowel and bladder function.  Past Medical/Surgical History: Past Medical History:  Diagnosis Date   Acute cystitis 09/26/2007   recurrent UTI's   Allergy    Atypical nevus 11/17/2002   Upper Center Back - Moderate   Atypical nevus 11/17/2002   Right Lower Abdomen - Moderate   Atypical nevus 04/23/2006   Left Upper Arm - Slight to Moderate   Atypical nevus 04/23/2006   Left Outer Lower Leg - Slight to Moderate   Atypical nevus 04/15/2012   Left Upper Buttock - Mild   Atypical nevus 08/25/2014   Right Inner Knee - Moderate   Atypical nevus 08/25/2014   Left Scapula - Severe   Basal cell carcinoma 08/25/2014   left axilla tx -curet and cautery   COUGH DUE TO ACE INHIBITORS 06/20/2009   FIBROCYSTIC BREAST DISEASE 10/10/2009   GOITER, MULTINODULAR 10/11/2009   Benign L thyroid nodule   Headache(784.0) 06/08/2008   HEMATURIA UNSPECIFIED 10/05/2008   HYPERGLYCEMIA 09/26/2007   HYPERLIPIDEMIA 07/21/2007   HYPERTENSION 11/22/2008   HYPOTHYROIDISM 07/21/2007   INSOMNIA 06/08/2008   Melanoma in situ (HCC) 03/07/2011   Base Of Post  Neck   Melanoma in situ (HCC) 05/07/2011   Base Of Post Neck - Clear   MYALGIA 10/23/2010   OSTEOPOROSIS 07/21/2007   Renal cell carcinoma (HCC) 09/07/2021   Right nephrectomy 09/2021. Dr. 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   Right Buccal Cheek (in situ)(CX35FU)   WEIGHT GAIN 06/08/2008    Past  Surgical History:  Procedure Laterality Date   BREAST LUMPECTOMY  11/05/1974   benign   COLONOSCOPY     POLYPECTOMY     ROBOT ASSISTED LAPAROSCOPIC NEPHRECTOMY Left 09/27/2021   Procedure: XI ROBOTIC ASSISTED LAPAROSCOPIC RADICAL NEPHRECTOMY;  Surgeon: Rene Paci, MD;  Location: [facility];  Service: Urology;  Laterality: Left;   THYROID SURGERY  11/05/2009   left nodule removed   VAGINAL DELIVERY     x1   VAGINAL HYSTERECTOMY  11/05/1972    Family History  Problem Relation Age of Onset   Cancer Mother        Ovarian Cancer   Ovarian cancer Mother    Hypertension Mother    Thyroid disease Mother    Lymphoma Sister    Cancer Sister        Breast Cancer, Kidney Cancer   Leukemia Sister    Breast cancer Maternal Aunt    Colon cancer Neg Hx    Rectal cancer Neg Hx    Stomach cancer Neg Hx    Endometrial cancer Neg Hx    Prostate cancer Neg Hx    Pancreatic cancer Neg Hx     Social History   Socioeconomic History   Marital status: Married    Spouse name: Not on file   Number of children: Not on file   Years of education: Not on file   Highest education level: Not on file  Occupational History   Occupation: Homemaker    Employer: RETIRED  Tobacco Use   Smoking status: Never   Smokeless tobacco: Never  Vaping Use   Vaping Use: Never used  Substance and Sexual Activity   Alcohol use: Yes    Alcohol/week: 0.0 standard drinks of alcohol    Comment: socially-2 glasses a week   Drug use: Never   Sexual activity: Not Currently  Other Topics Concern   Not on file  Social History Narrative   Does not work outside the home   Social Determinants of Health   Financial Resource Strain: Low Risk  (03/22/2022)   Overall Financial Resource Strain (CARDIA)    Difficulty of Paying Living Expenses: Not hard at all  Food Insecurity: No Food Insecurity (03/22/2022)   Hunger Vital Sign    Worried About Running Out of Food in the Last Year: Never true    Ran Out of  Food in the Last Year: Never true  Transportation Needs: No Transportation Needs (03/22/2022)   PRAPARE - Administrator, Civil Service (Medical): No    Lack of Transportation (Non-Medical): No  Physical Activity: Sufficiently Active (03/22/2022)   Exercise Vital Sign    Days of Exercise per Week: 5 days    Minutes of Exercise per Session: 120 min  Stress: No Stress Concern Present (03/22/2022)   [employer] of Occupational Health - Occupational Stress Questionnaire    Feeling of Stress : Not at all  Social Connections: Moderately Integrated (03/22/2022)   Social Connection and Isolation Panel [NHANES]    Frequency of Communication with Friends and Family: More than three times a week    Frequency of  Social Gatherings with Friends and Family: More than three times a week    Attends Religious Services: More than 4 times per year    Active Member of [employer] or Organizations: No    Attends Banker Meetings: Never    Marital Status: Married    Current Medications:  Current Outpatient Medications:    docusate sodium (COLACE) 100 MG capsule, Take 100 mg by mouth daily as needed for mild constipation., Disp: , Rfl:    EPINEPHrine 0.3 mg/0.3 mL IJ SOAJ injection, Inject 0.3 mg into the muscle as needed for anaphylaxis., Disp: , Rfl:    olmesartan (BENICAR) 40 MG tablet, TAKE 1 TABLET BY MOUTH EVERY DAY, Disp: 90 tablet, Rfl: 3   rosuvastatin (CRESTOR) 5 MG tablet, TAKE 5MG  BY MOUTH EVERY OTHER DAY, Disp: 45 tablet, Rfl: 3   SYMBICORT 80-4.5 MCG/ACT inhaler, Inhale 2 puffs into the lungs daily as needed (allergies)., Disp: , Rfl:    amoxicillin-clavulanate (AUGMENTIN) 875-125 MG tablet, Take 1 tablet by mouth 2 (two) times daily. (Patient not taking: Reported on 12/24/2022), Disp: 10 tablet, Rfl: 0   calcium carbonate (OS-CAL - DOSED IN MG OF ELEMENTAL CALCIUM) 1250 MG tablet, Take 1 tablet by mouth daily. (Patient not taking: Reported on 02/26/2023), Disp: , Rfl:     Cholecalciferol (VITAMIN D3) 2400 UNIT/ML LIQD, Take 2,400 Units by mouth daily. (Patient not taking: Reported on 02/26/2023), Disp: , Rfl:    fluconazole (DIFLUCAN) 150 MG tablet, Take 1 tablet (150 mg total) by mouth daily. Take one tablet. If no improvement in symptoms, take second tablet 2-3 days later. (Patient not taking: Reported on 02/26/2023), Disp: 2 tablet, Rfl: 0  Review of Systems: Denies appetite changes, fevers, chills, fatigue, unexplained weight changes. Denies hearing loss, neck lumps or masses, mouth sores, ringing in ears or voice changes. Denies cough or wheezing.  Denies shortness of breath. Denies chest pain or palpitations. Denies leg swelling. Denies abdominal distention, pain, blood in stools, constipation, diarrhea, nausea, vomiting, or early satiety. Denies pain with intercourse, dysuria, frequency, hematuria or incontinence. Denies hot flashes, pelvic pain, vaginal bleeding or vaginal discharge.   Denies joint pain, back pain or muscle pain/cramps. Denies itching, rash, or wounds. Denies dizziness, headaches, numbness or seizures. Denies swollen lymph nodes or glands, denies easy bruising or bleeding. Denies anxiety, depression, confusion, or decreased concentration.  Physical Exam: BP (!) 166/70 (BP Location: Left Arm, Patient Position: Sitting) Comment: takes med at night, Dr. Pricilla Holm notified  Pulse 72   Temp 98.5 F (36.9 C) (Oral)   Resp 16   Ht 5' (1.524 m)   Wt 143 lb 4.8 oz (65 kg)   LMP  (LMP Unknown)   SpO2 100%   BMI 27.99 kg/m  General: Alert, oriented, no acute distress. HEENT: Normocephalic, atraumatic, sclera anicteric. Chest: Clear to auscultation bilaterally.  No wheezes or rhonchi. Cardiovascular: Regular rate and rhythm, no murmurs.  Laboratory & Radiologic Studies: MRI pelvis 12/26/22: 1. 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 This remains worrisome  for cystic ovarian neoplasm. No solid component or suspicious contrast enhancement. 2. Unchanged multiseptated lesion of the right ovary, this with a thicker periphery of tissue and thin internal septations, measuring 2.9 x 2.2 cm. This lesion however is not significantly changed over a long period of time dating back to 11/12/2007 and is presumed benign given long-term stability. 3. No lymphadenopathy or metastatic disease in the  pelvis. 4. Severe descending and sigmoid diverticulosis without evidence of diverticulitis.  Assessment & Plan: Donna Andersen is a 82 y.o. woman with bilateral adnexal masses.    Patient overall continues to do very well and is asymptomatic.  We discussed MRI findings which show no significant change in either the right or left cystic adnexal masses.  They remained stable in size without significant findings that increase suspicion for malignancy.  We discussed the plan for robotic assisted bilateral salpingo-oophorectomy, possible staging, possible laparotomy. The risks of surgery were discussed in detail and she understands these to include infection; wound separation; hernia; injury to adjacent organs such as bowel, bladder, blood vessels, ureters and nerves; bleeding which may require blood transfusion; anesthesia risk; thromboembolic events; possible death; unforeseen complications; possible need for re-exploration; medical complications such as heart attack, stroke, pleural effusion and pneumonia; and, if full lymphadenectomy is performed the risk of lymphedema and lymphocyst. The patient will receive DVT and antibiotic prophylaxis as indicated. She voiced a clear understanding. She had the opportunity to ask questions. Perioperative instructions were reviewed with her. Prescriptions for post-op medications were sent to her pharmacy of choice.  The patient understands the plan will be to remove whatever adnexal structures are left.  These will then be sent to pathology  for frozen section.  If benign, no additional procedures would be performed.  In the event of a borderline tumor, we discussed staging including peritoneal biopsies and omentectomy.  In the event of a malignancy, we discussed the addition of lymphadenectomy.  Given her surgical history as well as history of endometriosis, discussed having urology inject ICG into the ureters at the start of the procedure to help retroperitoneal identification, especially on the left.  On some of her imaging, there is concern for possible loss of fat plane with the colon.  I think overall in the MRI, it appears that there is a plane.  Given her age, we discussed doing an antibiotic bowel prep without mechanical bowel prep.  She understands that if bowel surgery is performed, goal would be for reanastomosis.  If findings at the time of surgery strongly favor benign pathology, will attempt to avoid colon injury requiring colon surgery.  28 minutes of total time was spent for this patient encounter, including preparation, face-to-face counseling with the patient and coordination of care, and documentation of the encounter.  Eugene Garnet, MD  Division of Gynecologic Oncology  Department of Obstetrics and Gynecology  [facility]

## 2023-03-04 ENCOUNTER — Other Ambulatory Visit: Payer: Self-pay

## 2023-03-04 ENCOUNTER — Encounter (HOSPITAL_COMMUNITY)
Admission: RE | Admit: 2023-03-04 | Discharge: 2023-03-04 | Disposition: A | Discharge status: Home / Self Care | Payer: Medicare HMO | Source: Ambulatory Visit | Attending: Gynecologic Oncology | Admitting: Gynecologic Oncology

## 2023-03-04 ENCOUNTER — Encounter (HOSPITAL_COMMUNITY): Payer: Self-pay

## 2023-03-04 VITALS — BP 152/78 | HR 82 | Temp 99.0°F | Resp 20 | Ht 60.0 in | Wt 141.0 lb

## 2023-03-04 DIAGNOSIS — N9489 Other specified conditions associated with female genital organs and menstrual cycle: Secondary | ICD-10-CM | POA: Diagnosis not present

## 2023-03-04 DIAGNOSIS — M9902 Segmental and somatic dysfunction of thoracic region: Secondary | ICD-10-CM | POA: Diagnosis not present

## 2023-03-04 DIAGNOSIS — M25562 Pain in left knee: Secondary | ICD-10-CM | POA: Diagnosis not present

## 2023-03-04 DIAGNOSIS — M9906 Segmental and somatic dysfunction of lower extremity: Secondary | ICD-10-CM | POA: Diagnosis not present

## 2023-03-04 DIAGNOSIS — I10 Essential (primary) hypertension: Secondary | ICD-10-CM | POA: Diagnosis not present

## 2023-03-04 DIAGNOSIS — M25552 Pain in left hip: Secondary | ICD-10-CM | POA: Diagnosis not present

## 2023-03-04 DIAGNOSIS — Z01818 Encounter for other preprocedural examination: Secondary | ICD-10-CM | POA: Insufficient documentation

## 2023-03-04 DIAGNOSIS — M9904 Segmental and somatic dysfunction of sacral region: Secondary | ICD-10-CM | POA: Diagnosis not present

## 2023-03-04 DIAGNOSIS — M9905 Segmental and somatic dysfunction of pelvic region: Secondary | ICD-10-CM | POA: Diagnosis not present

## 2023-03-04 DIAGNOSIS — M9908 Segmental and somatic dysfunction of rib cage: Secondary | ICD-10-CM | POA: Diagnosis not present

## 2023-03-04 DIAGNOSIS — M353 Polymyalgia rheumatica: Secondary | ICD-10-CM | POA: Diagnosis not present

## 2023-03-04 DIAGNOSIS — M9901 Segmental and somatic dysfunction of cervical region: Secondary | ICD-10-CM | POA: Diagnosis not present

## 2023-03-04 HISTORY — DX: Other complications of anesthesia, initial encounter: T88.59XA

## 2023-03-04 LAB — COMPREHENSIVE METABOLIC PANEL
ALT: 18 U/L (ref 0–44)
AST: 18 U/L (ref 15–41)
Albumin: 4.1 g/dL (ref 3.5–5.0)
Alkaline Phosphatase: 84 U/L (ref 38–126)
Anion gap: 9 (ref 5–15)
BUN: 15 mg/dL (ref 8–23)
CO2: 25 mmol/L (ref 22–32)
Calcium: 8.7 mg/dL — ABNORMAL LOW (ref 8.9–10.3)
Chloride: 101 mmol/L (ref 98–111)
Creatinine, Ser: 1.15 mg/dL — ABNORMAL HIGH (ref 0.44–1.00)
GFR, Estimated: 48 mL/min — ABNORMAL LOW (ref >60)
Glucose, Bld: 101 mg/dL — ABNORMAL HIGH (ref 70–99)
Potassium: 4 mmol/L (ref 3.5–5.1)
Sodium: 135 mmol/L (ref 135–145)
Total Bilirubin: 0.6 mg/dL (ref 0.3–1.2)
Total Protein: 7.3 g/dL (ref 6.5–8.1)

## 2023-03-04 LAB — CBC
HCT: 40.3 % (ref 36.0–46.0)
Hemoglobin: 12.9 g/dL (ref 12.0–15.0)
MCH: 29.9 pg (ref 26.0–34.0)
MCHC: 32 g/dL (ref 30.0–36.0)
MCV: 93.3 fL (ref 80.0–100.0)
Platelets: 238 10*3/uL (ref 150–400)
RBC: 4.32 MIL/uL (ref 3.87–5.11)
RDW: 13.3 % (ref 11.5–15.5)
WBC: 6.9 10*3/uL (ref 4.0–10.5)
nRBC: 0 % (ref 0.0–0.2)

## 2023-03-04 LAB — TYPE AND SCREEN
ABO/RH(D): O POS
Antibody Screen: NEGATIVE

## 2023-03-05 ENCOUNTER — Telehealth: Payer: Self-pay | Admitting: *Deleted

## 2023-03-05 ENCOUNTER — Telehealth: Payer: Self-pay | Admitting: Oncology

## 2023-03-05 ENCOUNTER — Telehealth: Payer: Self-pay

## 2023-03-05 NOTE — Telephone Encounter (Signed)
-----   Message from Doylene Bode, NP sent at 03/05/2023  9:18 AM EDT ----- Selena Batten: Please let the patient know anesthesia recommends 4 weeks from having a URI. I have her surgery rescheduled for April 10, 2023.   Clydie Braun: Could you please update [facility] about her case and continued need for ICG in the ureters.   THank you ladies!

## 2023-03-05 NOTE — Telephone Encounter (Signed)
Donna Andersen called back and Dr. Sande Brothers has been added to the case for 04/10/23.

## 2023-03-05 NOTE — Telephone Encounter (Signed)
Patient called and stated "I had my pre op appt with the hospital and they stated that the anesthesiologist would probably cancel my surgery due to being sick. I did go ahead and saw my PCP . I was diagnosis with a sinus infection and given a Z-pak."  Per [facility] APP explained "the office received the message to cancel her appt, and move it out 4 weeks per policy. The will call you with new dates soon. The office is looking at the first of June."

## 2023-03-05 NOTE — Telephone Encounter (Signed)
Left a message for Shanda Bumps at [facility] regarding ICG dye for 04/10/23 surgery.  Requested a return call.

## 2023-03-05 NOTE — Telephone Encounter (Signed)
Pt is aware of new surgery date being moved to June 5th. She agrees to this date and is aware pre-admit will call her with surgery time.

## 2023-03-07 DIAGNOSIS — N9489 Other specified conditions associated with female genital organs and menstrual cycle: Secondary | ICD-10-CM

## 2023-03-07 LAB — TYPE AND SCREEN

## 2023-03-13 ENCOUNTER — Telehealth: Payer: Medicare HMO | Admitting: Gynecologic Oncology

## 2023-03-18 ENCOUNTER — Telehealth: Payer: Self-pay | Admitting: Family Medicine

## 2023-03-18 DIAGNOSIS — R69 Illness, unspecified: Secondary | ICD-10-CM | POA: Diagnosis not present

## 2023-03-18 NOTE — Telephone Encounter (Signed)
Contacted Donna Andersen to schedule their annual wellness visit. Call back at later date: 03/25/2023.  Patient will call back to schedule at a later date.  Gabriel Cirri [facility] TEAM Direct Dial [phone]

## 2023-03-19 ENCOUNTER — Encounter: Payer: Medicare HMO | Admitting: Family Medicine

## 2023-03-20 ENCOUNTER — Telehealth: Payer: Self-pay | Admitting: Family Medicine

## 2023-03-20 NOTE — Telephone Encounter (Signed)
Contacted Donna Andersen to schedule their annual wellness visit. Appointment made for 04/03/2023.  Gabriel Cirri [facility] TEAM Direct Dial [phone]

## 2023-03-25 ENCOUNTER — Encounter: Payer: Self-pay | Admitting: Gynecologic Oncology

## 2023-03-26 DIAGNOSIS — M9904 Segmental and somatic dysfunction of sacral region: Secondary | ICD-10-CM | POA: Diagnosis not present

## 2023-03-26 DIAGNOSIS — M170 Bilateral primary osteoarthritis of knee: Secondary | ICD-10-CM | POA: Diagnosis not present

## 2023-03-26 DIAGNOSIS — M9905 Segmental and somatic dysfunction of pelvic region: Secondary | ICD-10-CM | POA: Diagnosis not present

## 2023-03-26 DIAGNOSIS — M25562 Pain in left knee: Secondary | ICD-10-CM | POA: Diagnosis not present

## 2023-03-26 DIAGNOSIS — M9906 Segmental and somatic dysfunction of lower extremity: Secondary | ICD-10-CM | POA: Diagnosis not present

## 2023-03-27 ENCOUNTER — Encounter: Payer: Self-pay | Admitting: Family Medicine

## 2023-03-27 ENCOUNTER — Ambulatory Visit (INDEPENDENT_AMBULATORY_CARE_PROVIDER_SITE_OTHER): Payer: Medicare HMO | Admitting: Family Medicine

## 2023-03-27 VITALS — BP 110/62 | HR 72 | Temp 97.8°F | Ht 60.0 in | Wt 145.8 lb

## 2023-03-27 DIAGNOSIS — E782 Mixed hyperlipidemia: Secondary | ICD-10-CM

## 2023-03-27 DIAGNOSIS — Z0000 Encounter for general adult medical examination without abnormal findings: Secondary | ICD-10-CM | POA: Diagnosis not present

## 2023-03-27 DIAGNOSIS — D126 Benign neoplasm of colon, unspecified: Secondary | ICD-10-CM

## 2023-03-27 DIAGNOSIS — C642 Malignant neoplasm of left kidney, except renal pelvis: Secondary | ICD-10-CM | POA: Diagnosis not present

## 2023-03-27 DIAGNOSIS — I10 Essential (primary) hypertension: Secondary | ICD-10-CM | POA: Diagnosis not present

## 2023-03-27 DIAGNOSIS — N1831 Chronic kidney disease, stage 3a: Secondary | ICD-10-CM | POA: Diagnosis not present

## 2023-03-27 DIAGNOSIS — M8580 Other specified disorders of bone density and structure, unspecified site: Secondary | ICD-10-CM | POA: Diagnosis not present

## 2023-03-27 DIAGNOSIS — N9489 Other specified conditions associated with female genital organs and menstrual cycle: Secondary | ICD-10-CM

## 2023-03-27 NOTE — Progress Notes (Signed)
Subjective  Chief Complaint  Patient presents with   Annual Exam    Pt here for Annual exam and is currently fasting     HPI: Donna Andersen is a 82 y.o. female who presents to [facility] today for a Female Wellness Visit. She also has the concerns and/or needs as listed above in the chief complaint. These will be addressed in addition to the Health Maintenance Visit.   Wellness Visit: annual visit with health maintenance review and exam without Pap  HM: mammo done and normal, for bilateral salpingoopherectomy to evaluate bilateral adnexal masses. Reviewed gyn onc notes and MRI reports.  No further colonoscopies recommended per GI Abd/pelvic MRI 12/26/2022: IMPRESSION: 1. 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 This remains worrisome for cystic ovarian neoplasm. No solid component or suspicious contrast enhancement. 2. Unchanged multiseptated lesion of the right ovary, this with a thicker periphery of tissue and thin internal septations, measuring 2.9 x 2.2 cm. This lesion however is not significantly changed over a long period of time dating back to 11/12/2007 and is presumed benign given long-term stability. 3. No lymphadenopathy or metastatic disease in the pelvis. 4. Severe descending and sigmoid diverticulosis without evidence of diverticulitis.  Chronic disease f/u and/or acute problem visit: (deemed necessary to be done in addition to the wellness visit): White coat htn: home readings Home readings remain normal 120s over 70s occasionally up to 140 over 80s.  This is rare.  On benicar 40. Reviewed recent cmc, cbc and tsh. See below.  Stable renal function HLD on crestor 5 qod; last checked last year.  Fortunately is tolerating every other day dosing well without any myalgias at this point.. Lfts normal last month. Osteopenia: due for dexa Renal cell carcinoma:  completed treatment and on active surveillance. Reviewed notes.   Assessment  1. Annual physical exam   2. Clear cell renal cell carcinoma, left (HCC)   3. Mixed hyperlipidemia   4. White coat syndrome with diagnosis of hypertension   5. Chronic kidney disease, stage 3a (HCC)   6. Tubular adenoma of colon   7. Osteopenia, unspecified location   8. Adnexal mass      Plan  Female Wellness Visit: Age appropriate Health Maintenance and Prevention measures were discussed with patient. Included topics are cancer screening recommendations, ways to keep healthy (see AVS) including dietary and exercise recommendations, regular eye and dental care, use of seat belts, and avoidance of moderate alcohol use and tobacco use.  BMI: discussed patient's BMI and encouraged positive lifestyle modifications to help get to or maintain a target BMI. HM needs and immunizations were addressed and ordered. See below for orders. See HM and immunization section for updates. Routine labs and screening tests ordered including cmp, cbc and lipids where appropriate. Discussed recommendations regarding Vit D and calcium supplementation (see AVS)  Chronic disease management visit and/or acute problem visit: Renal cell carcinoma on active surveillance.  Has CT scan tomorrow.  Follows up with Dr. Sande Brothers every 6 months Scheduled for bilateral salpingo-oophorectomy.  Should do well.  Likely benign.  Scheduled in early June. HLD and tolerating Crestor every other day well.  5 mg.  Continue.  Can try increasing to 4 days/week to see if she can tolerate.  Defer lipid screening until next year. Chronic kidney disease remained stable, stage IIIa.  Avoid nephrotoxins.  Control blood pressure. Hypertension is well-controlled  on Benicar 40 mg daily.  No medication changes today. Osteopenia: Patient to schedule bone density.  Follow up: 6 months for blood pressure check continue home monitoring No orders of the defined types were  placed in this encounter.  No orders of the defined types were placed in this encounter.     Body mass index is 28.47 kg/m. Wt Readings from Last 3 Encounters:  03/27/23 145 lb 12.8 oz (66.1 kg)  03/04/23 141 lb (64 kg)  03/01/23 143 lb 4.8 oz (65 kg)     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    Priority: High    Left nephrectomy 09/2021. Dr. 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      Pembrolizumab 200 mg adjuvantly started on January 09, 2022.  She completed 9 cycles of therapy concluded in September 2023.   Current therapy: Active surveillance.    Tubular adenoma of colon 09/07/2021    Priority: High   White coat syndrome with diagnosis of hypertension 11/22/2008    Priority: High   Mixed hyperlipidemia 07/21/2007    Priority: High    therapy is limited by multiple perceived drug intolerances      Chronic kidney disease, stage 3a (HCC) 03/12/2022    Priority: Medium    Osteopenia 07/21/2007    Priority: Medium     Fosamax in 2013. Did not tolerate. BMD improved from increased weight bearing exercise on last dexa 04/2018. Does not want other medications. FrAX score was 4.3% for hip. Risks discussed. Okay with continuing calcium and vitamin D     Environmental allergies 06/27/2018    Priority: Low    Followed at [facility] Dr. Madie Reno     Fibrocystic breast changes, bilateral 10/10/2009    Priority: Low   ACE inhibitor intolerance 06/20/2009    Priority: Low   Health Maintenance  Topic Date Due    DTaP/Tdap/Td (1 - Tdap) Never done   DEXA SCAN  04/14/2021   Medicare Annual Wellness (AWV)  03/23/2023   COVID-19 Vaccine (6 - 2023-24 season) 04/12/2023 (Originally 07/06/2022)   MAMMOGRAM  01/08/2024   Pneumonia Vaccine 33+ Years old  Completed   HPV VACCINES  Aged Out   INFLUENZA VACCINE  Discontinued   COLONOSCOPY (Pts 45-58yrs Insurance coverage will need to be confirmed)  Discontinued   Zoster Vaccines- Shingrix  Discontinued   Immunization History  Administered Date(s) Administered   PFIZER(Purple Top)SARS-COV-2 Vaccination 12/10/2019, 01/04/2020, 03/17/2020, 09/28/2020, 02/05/2022   Pneumococcal Conjugate-13 02/16/2015   Pneumococcal Polysaccharide-23 10/11/2008, 09/20/2016, 09/13/2017, 09/12/2018, 09/01/2019, 03/17/2020   Zoster, Live 02/22/2015   We updated and reviewed the patient's past history in detail and it is documented below. Allergies: Patient is allergic to bee venom, atorvastatin, lisinopril, losartan potassium, shrimp extract, latex, other, and sulfonamide derivatives. Past Medical History Patient  has a past medical history of Acute cystitis (09/26/2007), Allergy, Atypical nevus (11/17/2002), Atypical nevus (11/17/2002), Atypical nevus (04/23/2006), Atypical nevus (04/23/2006), Atypical nevus (04/15/2012), Atypical nevus (08/25/2014), Atypical nevus (08/25/2014), Basal cell carcinoma (08/25/2014), Complication of anesthesia, COUGH DUE TO ACE INHIBITORS (06/20/2009), FIBROCYSTIC BREAST DISEASE (10/10/2009), GOITER, MULTINODULAR (10/11/2009), HEMATURIA  UNSPECIFIED (10/05/2008), HYPERGLYCEMIA (09/26/2007), HYPERLIPIDEMIA (07/21/2007), HYPERTENSION (11/22/2008), HYPOTHYROIDISM (07/21/2007), INSOMNIA (06/08/2008), Melanoma in situ (HCC) (03/07/2011), Melanoma in situ (HCC) (05/07/2011), MYALGIA (10/23/2010), OSTEOPOROSIS (07/21/2007), Renal cell carcinoma (HCC) (09/07/2021), 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 Past Surgical  History Patient  has a past surgical history that includes Vaginal hysterectomy (11/05/1972); Thyroid surgery (11/05/2009); Breast lumpectomy (11/05/1974); Vaginal delivery; Colonoscopy; Polypectomy; Robot assisted laparoscopic nephrectomy (Left, 09/27/2021); and Skin cancer excision. Family History: Patient family history includes Breast cancer in her maternal aunt; Cancer in her mother and sister; Hypertension in her mother; Leukemia in her sister; Lymphoma in her sister; Ovarian cancer in her mother; Thyroid disease in her mother. Social History:  Patient  reports that she has never smoked. She has never used smokeless tobacco. She reports current alcohol use. She reports that she does not use drugs.  Review of Systems: Constitutional: negative for fever or malaise Ophthalmic: negative for photophobia, double vision or loss of vision Cardiovascular: negative for chest pain, dyspnea on exertion, or new LE swelling Respiratory: negative for SOB or persistent cough Gastrointestinal: negative for abdominal pain, change in bowel habits or melena Genitourinary: negative for dysuria or gross hematuria, no abnormal uterine bleeding or disharge Musculoskeletal: negative for new gait disturbance or muscular weakness Integumentary: negative for new or persistent rashes, no breast lumps Neurological: negative for TIA or stroke symptoms Psychiatric: negative for SI or delusions Allergic/Immunologic: negative for hives  Patient Care Team    Relationship Specialty Notifications Start End  Willow Ora, MD PCP - General Family Medicine  07/04/21   Irena Cords, Enzo Montgomery, MD Consulting Physician Allergy and Immunology  09/17/18   Janalyn Harder, MD (Inactive) Consulting Physician Dermatology  04/02/19   Maris Berger, MD Consulting Physician Ophthalmology  04/02/19     Objective  Vitals: BP 110/62   Pulse 72   Temp 97.8 F (36.6 C)   Ht 5' (1.524 m)   Wt 145 lb 12.8 oz (66.1 kg)   LMP  (LMP  Unknown)   SpO2 93%   BMI 28.47 kg/m  General:  Well developed, well nourished, no acute distress  Psych:  Alert and orientedx3,normal mood and affect HEENT:  Normocephalic, atraumatic, non-icteric sclera,  supple neck without adenopathy, mass or thyromegaly Cardiovascular:  Normal S1, S2, RRR without gallop, rub or murmur Respiratory:  Good breath sounds bilaterally, CTAB with normal respiratory effort Gastrointestinal: normal bowel sounds, soft, non-tender, no noted masses. No HSM MSK: extremities without edema, joints without erythema or swelling Neurologic:    Mental status is normal.  Gross motor and sensory exams are normal.  No tremor  Commons side effects, risks, benefits, and alternatives for medications and treatment plan prescribed today were discussed, and the patient expressed understanding of the given instructions. Patient is instructed to call or message via MyChart if he/she has any questions or concerns regarding our treatment plan. No barriers to understanding were identified. We discussed Red Flag symptoms and signs in detail. Patient expressed understanding regarding what to do in case of urgent or emergency type symptoms.  Medication list was reconciled, printed and provided to the patient in AVS. Patient instructions and summary information was reviewed with the patient as documented in the AVS. This note was prepared with assistance of Dragon voice recognition software. Occasional wrong-word or sound-a-like substitutions may have occurred due to the inherent limitations of voice recognition software

## 2023-03-27 NOTE — Patient Instructions (Addendum)
Please return in 6 months for hypertension follow up.   If you have any questions or concerns, please don't hesitate to send me a message via MyChart or call the office at [phone]. Thank you for visiting with [place] today! It's our pleasure caring for you.   Please call the office checked below to schedule your appointment for your mammogram and/or bone density screen (the checked studies were ordered): []   Mammogram  [x]   Bone Density  [x]   [facility]     [address] [place], [place]        [phone]         []   [facility]  [address] [place], [place]  [phone]

## 2023-03-28 ENCOUNTER — Encounter: Payer: Medicare HMO | Admitting: Gynecologic Oncology

## 2023-04-02 NOTE — Progress Notes (Addendum)
Anesthesia Review:  PCP: Cipriano Bunker Physical 03/27/23- epic  Cardiologist : none  Chest x-ray : EKG : 03/04/23  Echo : Stress test: Cardiac Cath :  Activity level: can do a flight of stairs without difficutly  Sleep Study/ CPAP : none  Fasting Blood Sugar :      / Checks Blood Sugar -- times a day:   Blood Thinner/ Instructions /Last Dose: ASA / Instructions/ Last Dose :    Surgery was cancelled and rescheduled from a month ago due to URI sinus infection.   Pt voices no complaints at preop visit of 04/05/23.  PT denies having covid or flu a month ago .

## 2023-04-03 DIAGNOSIS — D49512 Neoplasm of unspecified behavior of left kidney: Secondary | ICD-10-CM | POA: Diagnosis not present

## 2023-04-03 DIAGNOSIS — C649 Malignant neoplasm of unspecified kidney, except renal pelvis: Secondary | ICD-10-CM | POA: Diagnosis not present

## 2023-04-03 NOTE — Patient Instructions (Addendum)
SURGICAL WAITING ROOM VISITATION  Patients having surgery or a procedure may have no more than 2 support people in the waiting area - these visitors may rotate.    Children under the age of 79 must have an adult with them who is not the patient.  Due to an increase in RSV and influenza rates and associated hospitalizations, children ages 33 and under may not visit patients in [facility] hospitals.  If the patient needs to stay at the hospital during part of their recovery, the visitor guidelines for inpatient rooms apply. Pre-op nurse will coordinate an appropriate time for 1 support person to accompany patient in pre-op.  This support person may not rotate.    Please refer to the [facility] website for the visitor guidelines for Inpatients (after your surgery is over and you are in a regular room).       Your procedure is scheduled on:    Report to [facility] Main Entrance    Report to admitting at AM   Call this number if you have problems the morning of surgery [phone]              Full liquid diet the day before surgery.  Follow bowel prep instructions.    Do not eat food  or drink liquids :After Midnight.                After Midnight you may have the following liquids until ___ 1130___ AM DAY OF SURGERY  Water Non-Citrus Juices (without pulp, NO RED-Apple, White grape, White cranberry) Black Coffee (NO MILK/CREAM OR CREAMERS, sugar ok)  Clear Tea (NO MILK/CREAM OR CREAMERS, sugar ok) regular and decaf                             Plain Jell-O (NO RED)                                           Fruit ices (not with fruit pulp, NO RED)                                     Popsicles (NO RED)                                                               Sports drinks like Gatorade (NO RED)         Oral Hygiene is also important to reduce your risk of infection.                                    Remember - BRUSH YOUR TEETH THE MORNING OF SURGERY WITH YOUR  REGULAR TOOTHPASTE  DENTURES WILL BE REMOVED PRIOR TO SURGERY PLEASE DO NOT APPLY "Poly grip" OR ADHESIVES!!!   Do NOT smoke after Midnight   Take these medicines the morning of surgery with A SIP OF WATER: Inhalers as usual and bring   DO NOT TAKE ANY ORAL DIABETIC MEDICATIONS DAY OF YOUR  SURGERY  Bring CPAP mask and tubing day of surgery.                              You may not have any metal on your body including hair pins, jewelry, and body piercing             Do not wear make-up, lotions, powders, perfumes/cologne, or deodorant  Do not wear nail polish including gel and S&S, artificial/acrylic nails, or any other type of covering on natural nails including finger and toenails. If you have artificial nails, gel coating, etc. that needs to be removed by a nail salon please have this removed prior to surgery or surgery may need to be canceled/ delayed if the surgeon/ anesthesia feels like they are unable to be safely monitored.   Do not shave  48 hours prior to surgery.               Men may shave face and neck.   Do not bring valuables to the hospital. [facility] IS NOT             RESPONSIBLE   FOR VALUABLES.   Contacts, glasses, dentures or bridgework may not be worn into surgery.   Bring small overnight bag day of surgery.   DO NOT BRING YOUR HOME MEDICATIONS TO THE HOSPITAL. PHARMACY WILL DISPENSE MEDICATIONS LISTED ON YOUR MEDICATION LIST TO YOU DURING YOUR ADMISSION IN THE HOSPITAL!    Patients discharged on the day of surgery will not be allowed to drive home.  Someone NEEDS to stay with you for the first 24 hours after anesthesia.   Special Instructions: Bring a copy of your healthcare power of attorney and living will documents the day of surgery if you haven't scanned them before.              Please read over the following fact sheets you were given: IF YOU HAVE QUESTIONS ABOUT YOUR PRE-OP INSTRUCTIONS PLEASE CALL [phone]   If you received a COVID test  during your pre-op visit  it is requested that you wear a mask when out in public, stay away from anyone that may not be feeling well and notify your surgeon if you develop symptoms. If you test positive for Covid or have been in contact with anyone that has tested positive in the last 10 days please notify you surgeon.    [facility] - Preparing for Surgery Before surgery, you can play an important role.  Because skin is not sterile, your skin needs to be as free of germs as possible.  You can reduce the number of germs on your skin by washing with CHG (chlorahexidine gluconate) soap before surgery.  CHG is an antiseptic cleaner which kills germs and bonds with the skin to continue killing germs even after washing. Please DO NOT use if you have an allergy to CHG or antibacterial soaps.  If your skin becomes reddened/irritated stop using the CHG and inform your nurse when you arrive at Short Stay. Do not shave (including legs and underarms) for at least 48 hours prior to the first CHG shower.  You may shave your face/neck. Please follow these instructions carefully:  1.  Shower with CHG Soap the night before surgery and the  morning of Surgery.  2.  If you choose to wash your hair, wash your hair first as usual with your  normal  shampoo.  3.  After  you shampoo, rinse your hair and body thoroughly to remove the  shampoo.                           4.  Use CHG as you would any other liquid soap.  You can apply chg directly  to the skin and wash                       Gently with a scrungie or clean washcloth.  5.  Apply the CHG Soap to your body ONLY FROM THE NECK DOWN.   Do not use on face/ open                           Wound or open sores. Avoid contact with eyes, ears mouth and genitals (private parts).                       Wash face,  Genitals (private parts) with your normal soap.             6.  Wash thoroughly, paying special attention to the area where your surgery  will be performed.  7.   Thoroughly rinse your body with warm water from the neck down.  8.  DO NOT shower/wash with your normal soap after using and rinsing off  the CHG Soap.                9.  Pat yourself dry with a clean towel.            10.  Wear clean pajamas.            11.  Place clean sheets on your bed the night of your first shower and do not  sleep with pets. Day of Surgery : Do not apply any lotions/deodorants the morning of surgery.  Please wear clean clothes to the hospital/surgery center.  FAILURE TO FOLLOW THESE INSTRUCTIONS MAY RESULT IN THE CANCELLATION OF YOUR SURGERY PATIENT SIGNATURE_________________________________  NURSE SIGNATURE__________________________________ Full Liquid Diet   Strained creamy soups Tea, Coffee- with cream or mild and sugar or honey  Juices- cranberry , grape and apple  Jello  Milkshakes  Pudding , custards  Popsicles  Water Plain ice cream f, frozen yogurt, sherbet, plain yogurt  Fruit ices and popsicles with no fruit pulp  Sugar, honey and syrups Clear broths  Boost, Ensure, Resource and other liquid supplements NO CARBONATED BEVERAGES    ________________________________________________________________________

## 2023-04-05 ENCOUNTER — Other Ambulatory Visit: Payer: Self-pay

## 2023-04-05 ENCOUNTER — Encounter (HOSPITAL_COMMUNITY)
Admission: RE | Admit: 2023-04-05 | Discharge: 2023-04-05 | Disposition: A | Discharge status: Home / Self Care | Payer: Medicare HMO | Source: Ambulatory Visit | Attending: Gynecologic Oncology | Admitting: Gynecologic Oncology

## 2023-04-05 ENCOUNTER — Encounter (HOSPITAL_COMMUNITY): Payer: Self-pay

## 2023-04-05 VITALS — BP 176/75 | HR 63 | Temp 98.5°F | Resp 16 | Ht 60.0 in | Wt 142.0 lb

## 2023-04-05 DIAGNOSIS — Z01818 Encounter for other preprocedural examination: Secondary | ICD-10-CM | POA: Insufficient documentation

## 2023-04-05 DIAGNOSIS — C642 Malignant neoplasm of left kidney, except renal pelvis: Secondary | ICD-10-CM | POA: Diagnosis not present

## 2023-04-05 HISTORY — DX: Unspecified asthma, uncomplicated: J45.909

## 2023-04-05 LAB — CBC
HCT: 40.1 % (ref 36.0–46.0)
Hemoglobin: 13 g/dL (ref 12.0–15.0)
MCH: 30.4 pg (ref 26.0–34.0)
MCHC: 32.4 g/dL (ref 30.0–36.0)
MCV: 93.7 fL (ref 80.0–100.0)
Platelets: 262 10*3/uL (ref 150–400)
RBC: 4.28 MIL/uL (ref 3.87–5.11)
RDW: 13.1 % (ref 11.5–15.5)
WBC: 7.9 10*3/uL (ref 4.0–10.5)
nRBC: 0 % (ref 0.0–0.2)

## 2023-04-05 LAB — COMPREHENSIVE METABOLIC PANEL
ALT: 16 U/L (ref 0–44)
AST: 18 U/L (ref 15–41)
Albumin: 4 g/dL (ref 3.5–5.0)
Alkaline Phosphatase: 74 U/L (ref 38–126)
Anion gap: 8 (ref 5–15)
BUN: 21 mg/dL (ref 8–23)
CO2: 23 mmol/L (ref 22–32)
Calcium: 8.9 mg/dL (ref 8.9–10.3)
Chloride: 108 mmol/L (ref 98–111)
Creatinine, Ser: 1.16 mg/dL — ABNORMAL HIGH (ref 0.44–1.00)
GFR, Estimated: 48 mL/min — ABNORMAL LOW (ref >60)
Glucose, Bld: 98 mg/dL (ref 70–99)
Potassium: 4.5 mmol/L (ref 3.5–5.1)
Sodium: 139 mmol/L (ref 135–145)
Total Bilirubin: 0.7 mg/dL (ref 0.3–1.2)
Total Protein: 6.9 g/dL (ref 6.5–8.1)

## 2023-04-09 ENCOUNTER — Telehealth: Payer: Self-pay | Admitting: *Deleted

## 2023-04-09 NOTE — Progress Notes (Signed)
Missing Pre-op Orders for Dr Alphonsa Overall part of patients procedure tomrorow,  Contacted via chat for orders.     Evern Bio BSN, Radio producer - Perioperative Services [place] [phone]

## 2023-04-09 NOTE — Telephone Encounter (Signed)
Telephone call to check on pre-operative status.  Patient compliant with pre-operative instructions.  Reinforced nothing to eat after midnight. Clear liquids until 0915. Patient to arrive at 1015.  No questions or concerns voiced.  Instructed to call for any needs.  

## 2023-04-10 ENCOUNTER — Encounter: Payer: Medicare HMO | Admitting: Family Medicine

## 2023-04-10 ENCOUNTER — Ambulatory Visit (HOSPITAL_COMMUNITY)
Admission: RE | Admit: 2023-04-10 | Discharge: 2023-04-10 | Disposition: A | Discharge status: Home / Self Care | Payer: Medicare HMO | Attending: Gynecologic Oncology | Admitting: Gynecologic Oncology

## 2023-04-10 ENCOUNTER — Ambulatory Visit (HOSPITAL_BASED_OUTPATIENT_CLINIC_OR_DEPARTMENT_OTHER): Payer: Medicare HMO | Admitting: Anesthesiology

## 2023-04-10 ENCOUNTER — Encounter (HOSPITAL_COMMUNITY): Payer: Self-pay | Admitting: Gynecologic Oncology

## 2023-04-10 ENCOUNTER — Other Ambulatory Visit: Payer: Self-pay

## 2023-04-10 ENCOUNTER — Ambulatory Visit (HOSPITAL_COMMUNITY): Payer: Medicare HMO | Admitting: Physician Assistant

## 2023-04-10 ENCOUNTER — Encounter (HOSPITAL_COMMUNITY)
Admission: RE | Disposition: A | Discharge status: Home / Self Care | Payer: Self-pay | Source: Home / Self Care | Attending: Gynecologic Oncology

## 2023-04-10 DIAGNOSIS — M810 Age-related osteoporosis without current pathological fracture: Secondary | ICD-10-CM | POA: Insufficient documentation

## 2023-04-10 DIAGNOSIS — D271 Benign neoplasm of left ovary: Secondary | ICD-10-CM | POA: Diagnosis not present

## 2023-04-10 DIAGNOSIS — N838 Other noninflammatory disorders of ovary, fallopian tube and broad ligament: Secondary | ICD-10-CM

## 2023-04-10 DIAGNOSIS — Z905 Acquired absence of kidney: Secondary | ICD-10-CM | POA: Diagnosis not present

## 2023-04-10 DIAGNOSIS — D270 Benign neoplasm of right ovary: Secondary | ICD-10-CM | POA: Diagnosis not present

## 2023-04-10 DIAGNOSIS — Z79899 Other long term (current) drug therapy: Secondary | ICD-10-CM | POA: Insufficient documentation

## 2023-04-10 DIAGNOSIS — K660 Peritoneal adhesions (postprocedural) (postinfection): Secondary | ICD-10-CM | POA: Insufficient documentation

## 2023-04-10 DIAGNOSIS — N9489 Other specified conditions associated with female genital organs and menstrual cycle: Secondary | ICD-10-CM

## 2023-04-10 DIAGNOSIS — J45909 Unspecified asthma, uncomplicated: Secondary | ICD-10-CM | POA: Insufficient documentation

## 2023-04-10 DIAGNOSIS — D49512 Neoplasm of unspecified behavior of left kidney: Secondary | ICD-10-CM | POA: Diagnosis not present

## 2023-04-10 DIAGNOSIS — I10 Essential (primary) hypertension: Secondary | ICD-10-CM | POA: Diagnosis not present

## 2023-04-10 DIAGNOSIS — Z01818 Encounter for other preprocedural examination: Secondary | ICD-10-CM

## 2023-04-10 DIAGNOSIS — R1900 Intra-abdominal and pelvic swelling, mass and lump, unspecified site: Secondary | ICD-10-CM | POA: Diagnosis not present

## 2023-04-10 DIAGNOSIS — E785 Hyperlipidemia, unspecified: Secondary | ICD-10-CM | POA: Diagnosis not present

## 2023-04-10 DIAGNOSIS — N939 Abnormal uterine and vaginal bleeding, unspecified: Secondary | ICD-10-CM | POA: Diagnosis not present

## 2023-04-10 DIAGNOSIS — K5730 Diverticulosis of large intestine without perforation or abscess without bleeding: Secondary | ICD-10-CM | POA: Insufficient documentation

## 2023-04-10 HISTORY — PX: ROBOTIC ASSISTED SALPINGO OOPHERECTOMY: SHX6082

## 2023-04-10 LAB — TYPE AND SCREEN
ABO/RH(D): O POS
Antibody Screen: NEGATIVE

## 2023-04-10 SURGERY — SALPINGO-OOPHORECTOMY, ROBOT-ASSISTED
Anesthesia: General | Laterality: Bilateral

## 2023-04-10 MED ORDER — FENTANYL CITRATE (PF) 100 MCG/2ML IJ SOLN
INTRAMUSCULAR | Status: AC
  Filled 2023-04-10: qty 2

## 2023-04-10 MED ORDER — EPHEDRINE SULFATE (PRESSORS) 50 MG/ML IJ SOLN
INTRAMUSCULAR | Status: DC | PRN
  Administered 2023-04-10 (×3): 10 mg via INTRAVENOUS

## 2023-04-10 MED ORDER — ROCURONIUM BROMIDE 10 MG/ML (PF) SYRINGE
PREFILLED_SYRINGE | INTRAVENOUS | Status: AC
  Filled 2023-04-10: qty 30

## 2023-04-10 MED ORDER — STERILE WATER FOR INJECTION IJ SOLN
INTRAMUSCULAR | Status: DC | PRN
  Administered 2023-04-10: 20 mL via INTRAMUSCULAR

## 2023-04-10 MED ORDER — LACTATED RINGERS IV SOLN: INTRAVENOUS | Status: DC

## 2023-04-10 MED ORDER — CHLORHEXIDINE GLUCONATE 0.12 % MT SOLN
15.0000 mL | Freq: Once | OROMUCOSAL | Status: AC
  Administered 2023-04-10: 15 mL via OROMUCOSAL

## 2023-04-10 MED ORDER — BUPIVACAINE HCL 0.25 % IJ SOLN
INTRAMUSCULAR | Status: DC | PRN
  Administered 2023-04-10: 30 mL

## 2023-04-10 MED ORDER — LACTATED RINGERS IR SOLN
Status: DC | PRN
  Administered 2023-04-10: 1000 mL

## 2023-04-10 MED ORDER — ORAL CARE MOUTH RINSE: 15.0000 mL | Freq: Once | OROMUCOSAL | Status: AC

## 2023-04-10 MED ORDER — PROPOFOL 10 MG/ML IV BOLUS
INTRAVENOUS | Status: DC | PRN
  Administered 2023-04-10: 100 mg via INTRAVENOUS

## 2023-04-10 MED ORDER — LIDOCAINE HCL (CARDIAC) PF 100 MG/5ML IV SOSY
PREFILLED_SYRINGE | INTRAVENOUS | Status: DC | PRN
  Administered 2023-04-10: 80 mg via INTRAVENOUS

## 2023-04-10 MED ORDER — CHLORHEXIDINE GLUCONATE 0.12 % MT SOLN: 15.0000 mL | Freq: Once | OROMUCOSAL | Status: AC

## 2023-04-10 MED ORDER — LIDOCAINE HCL (PF) 2 % IJ SOLN
INTRAMUSCULAR | Status: AC
  Filled 2023-04-10: qty 30

## 2023-04-10 MED ORDER — ACETAMINOPHEN 500 MG PO TABS
1000.0000 mg | ORAL_TABLET | ORAL | Status: AC
  Administered 2023-04-10: 1000 mg via ORAL
  Filled 2023-04-10: qty 2

## 2023-04-10 MED ORDER — FENTANYL CITRATE (PF) 100 MCG/2ML IJ SOLN
INTRAMUSCULAR | Status: DC | PRN
  Administered 2023-04-10: 50 ug via INTRAVENOUS
  Administered 2023-04-10: 100 ug via INTRAVENOUS

## 2023-04-10 MED ORDER — PHENYLEPHRINE HCL (PRESSORS) 10 MG/ML IV SOLN
INTRAVENOUS | Status: AC
  Filled 2023-04-10: qty 1

## 2023-04-10 MED ORDER — STERILE WATER FOR IRRIGATION IR SOLN
Status: DC | PRN
  Administered 2023-04-10: 3000 mL

## 2023-04-10 MED ORDER — STERILE WATER FOR INJECTION IJ SOLN
INTRAMUSCULAR | Status: DC | PRN
  Administered 2023-04-10: 10 mL

## 2023-04-10 MED ORDER — PHENYLEPHRINE HCL-NACL 20-0.9 MG/250ML-% IV SOLN
INTRAVENOUS | Status: DC | PRN
  Administered 2023-04-10: 20 ug/min via INTRAVENOUS

## 2023-04-10 MED ORDER — PROPOFOL 10 MG/ML IV BOLUS
INTRAVENOUS | Status: AC
  Filled 2023-04-10: qty 20

## 2023-04-10 MED ORDER — LIDOCAINE 2% (20 MG/ML) 5 ML SYRINGE
INTRAMUSCULAR | Status: DC | PRN
  Administered 2023-04-10: 1.5 mg/kg/h via INTRAVENOUS

## 2023-04-10 MED ORDER — OXYCODONE HCL 5 MG PO TABS
ORAL_TABLET | ORAL | Status: AC
  Filled 2023-04-10: qty 1

## 2023-04-10 MED ORDER — STERILE WATER FOR INJECTION IJ SOLN
INTRAMUSCULAR | Status: AC
  Filled 2023-04-10: qty 10

## 2023-04-10 MED ORDER — SUGAMMADEX SODIUM 200 MG/2ML IV SOLN
INTRAVENOUS | Status: DC | PRN
  Administered 2023-04-10: 150 mg via INTRAVENOUS

## 2023-04-10 MED ORDER — OXYCODONE HCL 5 MG/5ML PO SOLN: 5.0000 mg | Freq: Once | ORAL | Status: DC | PRN

## 2023-04-10 MED ORDER — OXYCODONE HCL 5 MG PO TABS
5.0000 mg | ORAL_TABLET | Freq: Once | ORAL | Status: AC | PRN
  Administered 2023-04-10: 5 mg via ORAL

## 2023-04-10 MED ORDER — LACTATED RINGERS IV SOLN: INTRAVENOUS | Status: DC | PRN

## 2023-04-10 MED ORDER — HYDROMORPHONE HCL 1 MG/ML IJ SOLN
0.2500 mg | INTRAMUSCULAR | Status: DC | PRN
  Administered 2023-04-10: 0.5 mg via INTRAVENOUS

## 2023-04-10 MED ORDER — OXYCODONE HCL 5 MG PO TABS: 5.0000 mg | ORAL_TABLET | Freq: Once | ORAL | Status: DC | PRN

## 2023-04-10 MED ORDER — HEPARIN SODIUM (PORCINE) 5000 UNIT/ML IJ SOLN
5000.0000 [IU] | INTRAMUSCULAR | Status: AC
  Administered 2023-04-10: 5000 [IU] via SUBCUTANEOUS
  Filled 2023-04-10: qty 1

## 2023-04-10 MED ORDER — ONDANSETRON HCL 4 MG/2ML IJ SOLN
INTRAMUSCULAR | Status: DC | PRN
  Administered 2023-04-10: 4 mg via INTRAVENOUS

## 2023-04-10 MED ORDER — DEXAMETHASONE SODIUM PHOSPHATE 4 MG/ML IJ SOLN
4.0000 mg | INTRAMUSCULAR | Status: AC
  Administered 2023-04-10: 4 mg via INTRAVENOUS

## 2023-04-10 MED ORDER — BUPIVACAINE HCL 0.25 % IJ SOLN
INTRAMUSCULAR | Status: AC
  Filled 2023-04-10: qty 1

## 2023-04-10 MED ORDER — STERILE WATER FOR IRRIGATION IR SOLN
Status: DC | PRN
  Administered 2023-04-10: 1000 mL

## 2023-04-10 MED ORDER — HYDROMORPHONE HCL 1 MG/ML IJ SOLN
INTRAMUSCULAR | Status: AC
  Filled 2023-04-10: qty 1

## 2023-04-10 MED ORDER — ROCURONIUM BROMIDE 100 MG/10ML IV SOLN
INTRAVENOUS | Status: DC | PRN
  Administered 2023-04-10: 70 mg via INTRAVENOUS

## 2023-04-10 MED ORDER — ONDANSETRON HCL 4 MG/2ML IJ SOLN: 4.0000 mg | Freq: Once | INTRAMUSCULAR | Status: DC | PRN

## 2023-04-10 SURGICAL SUPPLY — 97 items
ADAPTER GOLDBERG URETERAL (ADAPTER) IMPLANT
ADH SKN CLS APL DERMABOND .7 (GAUZE/BANDAGES/DRESSINGS) ×1
ADPR CATH 15X14FR FL DRN BG (ADAPTER)
AGENT HMST KT MTR STRL THRMB (HEMOSTASIS)
APL ESCP 34 STRL LF DISP (HEMOSTASIS)
APPLICATOR SURGIFLO ENDO (HEMOSTASIS) IMPLANT
BAG COUNTER SPONGE SURGICOUNT (BAG) IMPLANT
BAG LAPAROSCOPIC 12 15 PORT 16 (BASKET) IMPLANT
BAG RETRIEVAL 12/15 (BASKET) ×1
BAG SPNG CNTER NS LX DISP (BAG)
BAG URO CATCHER STRL LF (MISCELLANEOUS) ×2 IMPLANT
BLADE SURG SZ10 CARB STEEL (BLADE) IMPLANT
CATH URETL OPEN 5X70 (CATHETERS) IMPLANT
CLOTH BEACON ORANGE TIMEOUT ST (SAFETY) ×2 IMPLANT
COVER BACK TABLE 60X90IN (DRAPES) ×2 IMPLANT
COVER TIP SHEARS 8 DVNC (MISCELLANEOUS) ×2 IMPLANT
DERMABOND ADVANCED .7 DNX12 (GAUZE/BANDAGES/DRESSINGS) ×2 IMPLANT
DRAPE ARM DVNC X/XI (DISPOSABLE) ×8 IMPLANT
DRAPE COLUMN DVNC XI (DISPOSABLE) ×2 IMPLANT
DRAPE SHEET LG 3/4 BI-LAMINATE (DRAPES) ×2 IMPLANT
DRAPE SURG IRRIG POUCH 19X23 (DRAPES) ×2 IMPLANT
DRIVER NDL MEGA SUTCUT DVNCXI (INSTRUMENTS) ×2 IMPLANT
DRIVER NDLE MEGA SUTCUT DVNCXI (INSTRUMENTS) IMPLANT
DRSG OPSITE POSTOP 4X6 (GAUZE/BANDAGES/DRESSINGS) IMPLANT
DRSG OPSITE POSTOP 4X8 (GAUZE/BANDAGES/DRESSINGS) IMPLANT
ELECT PENCIL ROCKER SW 15FT (MISCELLANEOUS) IMPLANT
ELECT REM PT RETURN 15FT ADLT (MISCELLANEOUS) ×2 IMPLANT
FORCEPS BPLR FENES DVNC XI (FORCEP) ×2 IMPLANT
FORCEPS PROGRASP DVNC XI (FORCEP) ×2 IMPLANT
GAUZE 4X4 16PLY ~~LOC~~+RFID DBL (SPONGE) ×4 IMPLANT
GLOVE BIO SURGEON STRL SZ 6 (GLOVE) ×8 IMPLANT
GLOVE BIO SURGEON STRL SZ 6.5 (GLOVE) ×2 IMPLANT
GLOVE SURG LX STRL 7.5 STRW (GLOVE) ×2 IMPLANT
GLOVE SURG SS PI 6.0 STRL IVOR (GLOVE) IMPLANT
GLOVE SURG SS PI 6.5 STRL IVOR (GLOVE) IMPLANT
GLOVE SURG SS PI 7.5 STRL IVOR (GLOVE) IMPLANT
GOWN STRL REUS W/ TWL LRG LVL3 (GOWN DISPOSABLE) ×10 IMPLANT
GOWN STRL REUS W/TWL LRG LVL3 (GOWN DISPOSABLE) ×5
GRASPER SUT TROCAR 14GX15 (MISCELLANEOUS) IMPLANT
GUIDEWIRE ANG ZIPWIRE 038X150 (WIRE) IMPLANT
GUIDEWIRE STR DUAL SENSOR (WIRE) IMPLANT
HOLDER FOLEY CATH W/STRAP (MISCELLANEOUS) IMPLANT
IRRIG SUCT STRYKERFLOW 2 WTIP (MISCELLANEOUS) ×1
IRRIGATION SUCT STRKRFLW 2 WTP (MISCELLANEOUS) ×2 IMPLANT
KIT PROCEDURE DVNC SI (MISCELLANEOUS) ×2 IMPLANT
KIT TURNOVER KIT A (KITS) IMPLANT
LIGASURE IMPACT 36 18CM CVD LR (INSTRUMENTS) IMPLANT
MANIFOLD NEPTUNE II (INSTRUMENTS) ×2 IMPLANT
MANIPULATOR ADVINCU DEL 3.0 PL (MISCELLANEOUS) IMPLANT
MANIPULATOR ADVINCU DEL 3.5 PL (MISCELLANEOUS) IMPLANT
MANIPULATOR UTERINE 4.5 ZUMI (MISCELLANEOUS) IMPLANT
NDL HYPO 21X1.5 SAFETY (NEEDLE) ×2 IMPLANT
NDL SPNL 18GX3.5 QUINCKE PK (NEEDLE) IMPLANT
NEEDLE HYPO 21X1.5 SAFETY (NEEDLE) ×1 IMPLANT
NEEDLE SPNL 18GX3.5 QUINCKE PK (NEEDLE) IMPLANT
OBTURATOR OPTICAL STND 8 DVNC (TROCAR) ×1
OBTURATOR OPTICALSTD 8 DVNC (TROCAR) ×2 IMPLANT
PACK CYSTO (CUSTOM PROCEDURE TRAY) ×2 IMPLANT
PACK ROBOT GYN CUSTOM WL (TRAY / TRAY PROCEDURE) ×2 IMPLANT
PAD POSITIONING PINK XL (MISCELLANEOUS) ×2 IMPLANT
PORT ACCESS TROCAR AIRSEAL 12 (TROCAR) IMPLANT
SCISSORS MNPLR CVD DVNC XI (INSTRUMENTS) ×2 IMPLANT
SCRUB CHG 4% DYNA-HEX 4OZ (MISCELLANEOUS) IMPLANT
SEAL UNIV 5-12 XI (MISCELLANEOUS) ×8 IMPLANT
SET TRI-LUMEN FLTR TB AIRSEAL (TUBING) ×2 IMPLANT
SPIKE FLUID TRANSFER (MISCELLANEOUS) ×2 IMPLANT
SPONGE T-LAP 18X18 ~~LOC~~+RFID (SPONGE) IMPLANT
SURGIFLO W/THROMBIN 8M KIT (HEMOSTASIS) IMPLANT
SUT MNCRL AB 4-0 PS2 18 (SUTURE) IMPLANT
SUT PDS AB 1 TP1 96 (SUTURE) IMPLANT
SUT V-LOC 180 0-0 GS22 (SUTURE) IMPLANT
SUT VIC AB 0 CT1 27 (SUTURE)
SUT VIC AB 0 CT1 27XBRD ANTBC (SUTURE) IMPLANT
SUT VIC AB 2-0 CT1 27 (SUTURE)
SUT VIC AB 2-0 CT1 TAPERPNT 27 (SUTURE) IMPLANT
SUT VIC AB 4-0 PS2 18 (SUTURE) ×4 IMPLANT
SUT VICRYL 0 27 CT2 27 ABS (SUTURE) ×2 IMPLANT
SUT VICRYL 0 UR6 27IN ABS (SUTURE) IMPLANT
SUT VLOC 180 0 9IN GS21 (SUTURE) IMPLANT
SYR 10ML LL (SYRINGE) IMPLANT
SYS BAG RETRIEVAL 10MM (BASKET)
SYS RETRIEVAL 5MM INZII UNIV (BASKET) ×1
SYS WOUND ALEXIS 18CM MED (MISCELLANEOUS)
SYSTEM BAG RETRIEVAL 10MM (BASKET) IMPLANT
SYSTEM RETRIEVL 5MM INZII UNIV (BASKET) IMPLANT
SYSTEM WOUND ALEXIS 18CM MED (MISCELLANEOUS) IMPLANT
TOWEL OR NON WOVEN STRL DISP B (DISPOSABLE) IMPLANT
TRAP SPECIMEN MUCUS 40CC (MISCELLANEOUS) IMPLANT
TRAY FOL W/BAG SLVR 16FR STRL (SET/KITS/TRAYS/PACK) IMPLANT
TRAY FOLEY MTR SLVR 16FR STAT (SET/KITS/TRAYS/PACK) ×2 IMPLANT
TRAY FOLEY W/BAG SLVR 16FR LF (SET/KITS/TRAYS/PACK) ×1
TROCAR PORT AIRSEAL 5X120 (TROCAR) IMPLANT
TUBING CONNECTING 10 (TUBING) ×2 IMPLANT
TUBING UROLOGY SET (TUBING) IMPLANT
UNDERPAD 30X36 HEAVY ABSORB (UNDERPADS AND DIAPERS) ×4 IMPLANT
WATER STERILE IRR 1000ML POUR (IV SOLUTION) ×2 IMPLANT
YANKAUER SUCT BULB TIP 10FT TU (MISCELLANEOUS) IMPLANT

## 2023-04-10 NOTE — Discharge Instructions (Addendum)
AFTER SURGERY INSTRUCTIONS   Return to work: 4-6 weeks if applicable  Today Dr. Pricilla Holm removed both ovaries. On frozen section with the pathologist during surgery, they felt these were benign (non-cancerous). We will await final pathology to confirm this.   Activity: 1. Be up and out of the bed during the day.  Take a nap if needed.  You may walk up steps but be careful and use the hand rail.  Stair climbing will tire you more than you think, you may need to stop part way and rest.    2. No lifting or straining for 6 weeks over 10 pounds. No pushing, pulling, straining for 6 weeks.   3. No driving for 1-61 days.  Do not drive if you are taking narcotic pain medicine and make sure that your reaction time has returned.    4. You can shower as soon as the next day after surgery. Shower daily.  Use your regular soap and water (not directly on the incision) and pat your incision(s) dry afterwards; don't rub.  No tub baths or submerging your body in water until cleared by your surgeon. If you have the soap that was given to you by pre-surgical testing that was used before surgery, you do not need to use it afterwards because this can irritate your incisions.    5. No sexual activity and nothing in the vagina for 4-6 weeks.   6. You may experience a small amount of clear drainage from your incisions, which is normal.  If the drainage persists, increases, or changes color please call the office.   7. Do not use creams, lotions, or ointments such as neosporin on your incisions after surgery until advised by your surgeon because they can cause removal of the dermabond glue on your incisions.     8. Take Tylenol first for pain if you are able to take these medication and only use narcotic pain medication for severe pain not relieved by the Tylenol.  Monitor your Tylenol intake to a max of 4,000 mg in a 24 hour period.    Diet: 1. Low sodium Heart Healthy Diet is recommended but you are cleared to resume  your normal (before surgery) diet after your procedure.   2. It is safe to use a laxative, such as Miralax or Colace, if you have difficulty moving your bowels. You have been prescribed Sennakot-S to take at bedtime every evening after surgery to keep bowel movements regular and to prevent constipation.     Wound Care: 1. Keep clean and dry.  Shower daily.   Reasons to call the Doctor: Fever - Oral temperature greater than 100.4 degrees Fahrenheit Foul-smelling vaginal discharge Difficulty urinating Nausea and vomiting Increased pain at the site of the incision that is unrelieved with pain medicine. Difficulty breathing with or without chest pain New calf pain especially if only on one side Sudden, continuing increased vaginal bleeding with or without clots.   Contacts: For questions or concerns you should contact:   Dr. Eugene Garnet at [phone]   Warner Mccreedy, NP at [phone]   After Hours: call [phone] and have the GYN Oncologist paged/contacted (after 5 pm or on the weekends). You will speak with an after hours RN and let he or she know you have had surgery.   Messages sent via mychart are for non-urgent matters and are not responded to after hours so for urgent needs, please call the after hours number.

## 2023-04-10 NOTE — Anesthesia Procedure Notes (Signed)
Procedure Name: Intubation Date/Time: 04/10/2023 1:43 PM  Performed by: Deri Fuelling, CRNAPre-anesthesia Checklist: Patient identified, Emergency Drugs available, Suction available and Patient being monitored Patient Re-evaluated:Patient Re-evaluated prior to induction Oxygen Delivery Method: Circle system utilized Preoxygenation: Pre-oxygenation with 100% oxygen Induction Type: IV induction Ventilation: Mask ventilation without difficulty Laryngoscope Size: Mac and 3 Grade View: Grade I Tube type: Oral Number of attempts: 1 Airway Equipment and Method: Stylet and Oral airway Placement Confirmation: ETT inserted through vocal cords under direct vision, positive ETCO2 and breath sounds checked- equal and bilateral Secured at: 20 cm Tube secured with: Tape Dental Injury: Teeth and Oropharynx as per pre-operative assessment

## 2023-04-10 NOTE — Anesthesia Preprocedure Evaluation (Addendum)
Anesthesia Evaluation  Patient identified by MRN, date of birth, ID band Patient awake    Reviewed: Allergy & Precautions, NPO status , Patient's Chart, lab work & pertinent test results, reviewed documented beta blocker date and time   Airway Mallampati: I  TM Distance: >3 FB Neck ROM: Full    Dental no notable dental hx. (+) Caps, Dental Advisory Given   Pulmonary asthma    breath sounds clear to auscultation + decreased breath sounds      Cardiovascular hypertension, Pt. on medications Normal cardiovascular exam Rhythm:Regular Rate:Normal     Neuro/Psych  Neuromuscular disease  negative psych ROS   GI/Hepatic negative GI ROS, Neg liver ROS,,,  Endo/Other  Hyperlipidemia  Renal/GU Renal InsufficiencyRenal diseaseHx/o Renal Cell Ca S/P right nephrectomy Bladder dysfunction      Musculoskeletal  (+) Arthritis ,  Osteoporosis   Abdominal   Peds  Hematology negative hematology ROS (+)   Anesthesia Other Findings   Reproductive/Obstetrics Bilateral Adnexal masses                             Anesthesia Physical Anesthesia Plan  ASA: 2  Anesthesia Plan: General   Post-op Pain Management: Dilaudid IV, Precedex and Ofirmev IV (intra-op)*   Induction: Intravenous  PONV Risk Score and Plan: 4 or greater and Treatment may vary due to age or medical condition and Ondansetron  Airway Management Planned: Oral ETT  Additional Equipment: None  Intra-op Plan:   Post-operative Plan: Extubation in OR  Informed Consent: I have reviewed the patients History and Physical, chart, labs and discussed the procedure including the risks, benefits and alternatives for the proposed anesthesia with the patient or authorized representative who has indicated his/her understanding and acceptance.     Dental advisory given  Plan Discussed with: CRNA and Anesthesiologist  Anesthesia Plan Comments:          Anesthesia Quick Evaluation

## 2023-04-10 NOTE — Anesthesia Postprocedure Evaluation (Signed)
Anesthesia Post Note  Patient: Zelphia Cairo Leckrone  Procedure(s) Performed: XI ROBOTIC ASSISTED SALPINGO OOPHORECTOMY (Bilateral) CYSTOSCOPY WITH ICG (Bilateral)     Patient location during evaluation: PACU Anesthesia Type: General Level of consciousness: awake and alert and oriented Pain management: pain level controlled Vital Signs Assessment: post-procedure vital signs reviewed and stable Respiratory status: spontaneous breathing, nonlabored ventilation and respiratory function stable Cardiovascular status: blood pressure returned to baseline and stable Postop Assessment: no apparent nausea or vomiting Anesthetic complications: no   No notable events documented.  Last Vitals:  Vitals:   04/10/23 1600 04/10/23 1612  BP: (!) 165/77 (!) 161/74  Pulse: 72 73  Resp: 13 16  Temp: 36.4 C   SpO2: 94% 96%    Last Pain:  Vitals:   04/10/23 1612  TempSrc:   PainSc: 3                  Jaxxson Cavanah A.

## 2023-04-10 NOTE — H&P (Signed)
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 In 09/2021, she underwent robotic left radical nephrectomy. She then received 9 cycles of adjuvant IO with Pembrolizumab, completed in 06/2022.   CT of the chest and abdomen on 08/22/2022 shows stable exam post left nephrectomy without evidence of local recurrence.  No evidence of metastatic disease within the chest or abdomen.  Stable right renal lesions noted.  Partially visualized multiseptated 10.1 x 6.3 cm fluid density in the left anterior abdomen appearing to be in continuity with the sigmoid colon, visualized portions of which are slightly increased in comparison to prior CT scan.  In retrospect visible only on coronal sequences of images from prior MRI dated August 15, 2021.  On this MRI, thin internal enhancing septation without definitive soft tissue nodularity or wall septal thickening.  Stable small bilateral pulmonary nodules.  Left-sided colonic diverticulosis without findings of acute diverticulitis.   MRI of the pelvis on 09/22/2022 shows a 10.7 cm complex cystic lesion in the left adnexal region, suspicious for cystic ovarian neoplasm although no malignant features are visualized.  3.2 cm complex cystic lesion is seen in the right adnexa, again no malignant features identified.  No evidence of pelvic metastatic disease including no peritoneal thickening or adenopathy.  Severe sigmoid diverticulosis noted without diverticulitis.   Her last colonoscopy was in 2017.  She was  told at that time she needed follow-up in 7 years.  She was seen by GI last year and told that she could defer colonoscopy if she wanted.  She ultimately decided to defer colonoscopy.   Tumor markers 10/09/22: CEA - 1.30 CA-125 - 15.8  Interval History: Doing well.  Past Medical/Surgical History: Past Medical History:  Diagnosis Date   Acute cystitis 09/26/2007   recurrent UTI's   Allergy    Asthma    as a child   Atypical nevus 11/17/2002   Upper Center Back - Moderate   Atypical nevus 11/17/2002   Right Lower Abdomen - Moderate   Atypical nevus 04/23/2006   Left Upper Arm - Slight to Moderate   Atypical nevus 04/23/2006   Left Outer Lower Leg - Slight to Moderate   Atypical nevus 04/15/2012   Left Upper Buttock - Mild   Atypical nevus 08/25/2014   Right Inner Knee - Moderate   Atypical nevus 08/25/2014   Left Scapula - Severe   Basal cell carcinoma 08/25/2014   left axilla tx -curet and cautery   COUGH DUE TO ACE INHIBITORS 06/20/2009   FIBROCYSTIC BREAST DISEASE 10/10/2009   GOITER, MULTINODULAR 10/11/2009   Benign L thyroid nodule   HEMATURIA UNSPECIFIED 10/05/2008   HYPERGLYCEMIA 09/26/2007   HYPERLIPIDEMIA 07/21/2007   HYPERTENSION 11/22/2008   INSOMNIA 06/08/2008   Melanoma in situ (HCC) 03/07/2011   Base Of Post Neck   Melanoma in situ [facility]) 05/07/2011   Base Of Post Neck - Clear   MYALGIA 10/23/2010   OSTEOPOROSIS 07/21/2007   Renal cell carcinoma (HCC) 09/07/2021   Right nephrectomy 09/2021. Dr. 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   Right Buccal Cheek (in situ)(CX35FU)   WEIGHT GAIN 06/08/2008    Past Surgical History:  Procedure Laterality Date   BREAST LUMPECTOMY  11/05/1974   benign   COLONOSCOPY     POLYPECTOMY     ROBOT ASSISTED LAPAROSCOPIC NEPHRECTOMY Left 09/27/2021   Procedure: XI ROBOTIC ASSISTED LAPAROSCOPIC RADICAL NEPHRECTOMY;  Surgeon: Rene Paci, MD;  Location: [facility];  Service:  Urology;  Laterality: Left;   SKIN CANCER EXCISION     THYROID SURGERY  11/05/2009   left nodule removed   VAGINAL DELIVERY     x1   VAGINAL HYSTERECTOMY  11/05/1972    Family History  Problem Relation Age of Onset   Cancer Mother        Ovarian Cancer   Ovarian cancer Mother    Hypertension Mother    Thyroid disease Mother    Lymphoma Sister    Cancer Sister        Breast Cancer, Kidney Cancer   Leukemia Sister    Breast cancer Maternal Aunt    Colon cancer Neg Hx    Rectal cancer Neg Hx    Stomach cancer Neg Hx    Endometrial cancer Neg Hx    Prostate cancer Neg Hx    Pancreatic cancer Neg Hx     Social History   Socioeconomic History   Marital status: Married    Spouse name: Not on file   Number of children: Not on file   Years of education: Not on file   Highest education level: Not on file  Occupational History   Occupation: Homemaker    Employer: RETIRED  Tobacco Use   Smoking status: Never   Smokeless tobacco: Never  Vaping Use   Vaping Use: Never used  Substance and Sexual Activity   Alcohol use: Yes    Alcohol/week: 0.0 standard drinks of alcohol    Comment: socially-2 glasses a week   Drug use: Never   Sexual activity: Not Currently    Birth control/protection: Surgical  Other Topics Concern   Not on file  Social History Narrative   Does not work outside the home   Social Determinants of Health   Financial Resource Strain: Low Risk  (03/22/2022)   Overall Financial Resource Strain (CARDIA)    Difficulty of Paying Living Expenses: Not hard at all  Food Insecurity: No Food Insecurity (03/22/2022)   Hunger Vital Sign    Worried About Running Out of Food in the Last Year: Never true    Ran Out of Food in the Last Year: Never true  Transportation Needs: No Transportation Needs (03/22/2022)   PRAPARE - Administrator, Civil Service (Medical): No    Lack of Transportation (Non-Medical): No  Physical Activity: Sufficiently Active  (03/22/2022)   Exercise Vital Sign    Days of Exercise per Week: 5 days    Minutes of Exercise per Session: 120 min  Stress: No Stress Concern Present (03/22/2022)   [employer] of Occupational Health - Occupational Stress Questionnaire    Feeling of Stress : Not at all  Social Connections: Moderately Integrated (03/22/2022)   Social Connection and Isolation Panel [NHANES]    Frequency of Communication with Friends and Family: More than three times a week    Frequency of Social Gatherings with Friends and Family: More than three times a week    Attends Religious Services: More than 4 times per year    Active Member of  Clubs or Organizations: No    Attends Engineer, structural: Never    Marital Status: Married    Current Medications:  Current Facility-Administered Medications:    dexamethasone (DECADRON) injection 4 mg, 4 mg, Intravenous, On Call to OR, Warner Mccreedy D, NP   lactated ringers infusion, , Intravenous, Continuous, Marcene Duos, MD, Last Rate: 10 mL/hr at 04/10/23 1105, New Bag at 04/10/23 1105   lactated ringers infusion, , Intravenous, Continuous, Beryle Lathe, MD, Last Rate: 10 mL/hr at 04/10/23 1101, New Bag at 04/10/23 1101  Review of Systems: Denies appetite changes, fevers, chills, fatigue, unexplained weight changes. Denies hearing loss, neck lumps or masses, mouth sores, ringing in ears or voice changes. Denies cough or wheezing.  Denies shortness of breath. Denies chest pain or palpitations. Denies leg swelling. Denies abdominal distention, pain, blood in stools, constipation, diarrhea, nausea, vomiting, or early satiety. Denies pain with intercourse, dysuria, frequency, hematuria or incontinence. Denies hot flashes, pelvic pain, vaginal bleeding or vaginal discharge.   Denies joint pain, back pain or muscle pain/cramps. Denies itching, rash, or wounds. Denies dizziness, headaches, numbness or seizures. Denies swollen lymph nodes or  glands, denies easy bruising or bleeding. Denies anxiety, depression, confusion, or decreased concentration.  Physical Exam: BP (!) 169/70   Pulse 74   Temp 98.8 F (37.1 C) (Oral)   Resp 16   Ht 5' (1.524 m)   Wt 142 lb (64.4 kg)   LMP  (LMP Unknown)   SpO2 (!) 70%   BMI 27.73 kg/m  General: Alert, oriented, no acute distress. HEENT: Normocephalic, atraumatic, sclera anicteric. Chest: Clear to auscultation bilaterally.  No wheezes or rhonchi. Cardiovascular: Regular rate and rhythm, no murmurs.  Laboratory & Radiologic Studies: MRI pelvis 12/26/22: 1. 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 This remains worrisome for cystic ovarian neoplasm. No solid component or suspicious contrast enhancement. 2. Unchanged multiseptated lesion of the right ovary, this with a thicker periphery of tissue and thin internal septations, measuring 2.9 x 2.2 cm. This lesion however is not significantly changed over a long period of time dating back to 11/12/2007 and is presumed benign given long-term stability. 3. No lymphadenopathy or metastatic disease in the pelvis. 4. Severe descending and sigmoid diverticulosis without evidence of diverticulitis.     Latest Ref Rng & Units 04/05/2023    8:28 AM 03/04/2023    8:13 AM 10/09/2022    9:52 AM  CBC  WBC 4.0 - 10.5 K/uL 7.9  6.9  9.3   Hemoglobin 12.0 - 15.0 g/dL 16.1  09.6  04.5   Hematocrit 36.0 - 46.0 % 40.1  40.3  38.7   Platelets 150 - 400 K/uL 262  238  307       Latest Ref Rng & Units 04/05/2023    8:28 AM 03/04/2023    8:13 AM 10/09/2022    9:52 AM  BMP  Glucose 70 - 99 mg/dL 98  409  811   BUN 8 - 23 mg/dL 21  15  15    Creatinine 0.44 - 1.00 mg/dL 9.14  7.82  9.56   Sodium 135 - 145 mmol/L 139  135  139   Potassium 3.5 - 5.1 mmol/L 4.5  4.0  4.3   Chloride 98 - 111 mmol/L 108  101  106   CO2 22 - 32 mmol/L 23  25  24    Calcium 8.9 - 10.3 mg/dL  8.9  8.7   9.5      Assessment & Plan: Donna Andersen is a 82 y.o. woman with bilateral adnexal masses.    Plan for definitive surgery today. Ureteral ICG injection by Dr. Liliane Shi. Robotic BSO, any other indicated procedures. See counseling on 03/01/23.  Eugene Garnet, MD  Division of Gynecologic Oncology  Department of Obstetrics and Gynecology  [facility]

## 2023-04-10 NOTE — Transfer of Care (Signed)
Immediate Anesthesia Transfer of Care Note  Patient: Donna Andersen  Procedure(s) Performed: XI ROBOTIC ASSISTED SALPINGO OOPHORECTOMY (Bilateral) CYSTOSCOPY WITH ICG (Bilateral)  Patient Location: PACU  Anesthesia Type:General  Level of Consciousness: drowsy and patient cooperative  Airway & Oxygen Therapy: Patient Spontanous Breathing and Patient connected to face mask oxygen  Post-op Assessment: Report given to RN and Post -op Vital signs reviewed and stable  Post vital signs: Reviewed and stable  Last Vitals:  Vitals Value Taken Time  BP    Temp 36.4 C 04/10/23 1525  Pulse 76 04/10/23 1525  Resp    SpO2      Last Pain:  Vitals:   04/10/23 1113  TempSrc:   PainSc: 0-No pain         Complications: No notable events documented.

## 2023-04-10 NOTE — Op Note (Signed)
Operative Note  Preoperative diagnosis:  1. Left ovarian mass  Postoperative diagnosis: 1.  Same  Procedure(s): 1.  Cystoscopy with bilateral ureteral FireFly injections  Surgeon: Rhoderick Moody, MD  Assistants:  None   Anesthesia:  General  Complications:  None  EBL:  <5 mL  Specimens: 1. None  Drains/Catheters: 1.  16 French Foley  Intraoperative findings:   No intravesical pathology was seen on cystoscopy  Indication:  The patient is a an 82 year old female with a history of a left ovarian mass requiring laparoscopic surgical removal with Dr. Pricilla Holm.  She has a history of RCC involving the left kidney, s/p left robotic nephrectomy in 2022.  Urology has been consulted to performed cystoscopy with bilateral ureteral Fire Fly injection to aide in intraoperative ureteral identification. The patient has been consented for the above procedures, voices understanding and wishes to proceed.  The patient has been consented for the above procedures, voices understanding and wishes to procede  Description of procedure: After informed consent was obtained, the patient was brought to the operating room and general endotracheal anesthesia was administered. The patient was then placed in the dorsolithotomy position and prepped and draped in usual sterile fashion. A timeout was performed. A 21 French rigid cystoscope was then inserted into the urethral meatus and advanced into the bladder under direct vision. A complete bladder survey revealed no intravesical pathology.   A 6 [place] open-ended catheter was then used to intubate the right ureteral orifice and a total of 7.5 mL of firefly solution diluted with 10 mL of saline was injected into the right collecting system. A similar maneuver was then carried out on the left with the same volume and concentration of firefly. The rigid cystoscope was then removed under direct vision. A 16 French Foley catheter was then inserted and placed to  gravity drainage. The patient tolerated the procedure well. Dr. Pricilla Holm then proceeded with her portion of the case  Plan:  Foley removal is at the discretion of the primary team.

## 2023-04-10 NOTE — Consult Note (Signed)
Urology Consult   Physician requesting consult: Eugene Garnet, MD  Reason for consult: Ureteral firefly injections  History of Present Illness: Donna Andersen is a 82 y.o. female with a left-sided adnexal mass undergoing robotic excision with Dr. Pricilla Holm.  Urology has been consulted to instill firefly into the right ureter.  Patient has a history of RCC involving the left kidney and is status post left radical nephrectomy and 09/2021.  She denies interval episodes of flank pain, dysuria, hematuria or UTIs.  Past Medical History:  Diagnosis Date   Acute cystitis 09/26/2007   recurrent UTI's   Allergy    Asthma    as a child   Atypical nevus 11/17/2002   Upper Center Back - Moderate   Atypical nevus 11/17/2002   Right Lower Abdomen - Moderate   Atypical nevus 04/23/2006   Left Upper Arm - Slight to Moderate   Atypical nevus 04/23/2006   Left Outer Lower Leg - Slight to Moderate   Atypical nevus 04/15/2012   Left Upper Buttock - Mild   Atypical nevus 08/25/2014   Right Inner Knee - Moderate   Atypical nevus 08/25/2014   Left Scapula - Severe   Basal cell carcinoma 08/25/2014   left axilla tx -curet and cautery   COUGH DUE TO ACE INHIBITORS 06/20/2009   FIBROCYSTIC BREAST DISEASE 10/10/2009   GOITER, MULTINODULAR 10/11/2009   Benign L thyroid nodule   HEMATURIA UNSPECIFIED 10/05/2008   HYPERGLYCEMIA 09/26/2007   HYPERLIPIDEMIA 07/21/2007   HYPERTENSION 11/22/2008   INSOMNIA 06/08/2008   Melanoma in situ (HCC) 03/07/2011   Base Of Post Neck   Melanoma in situ [facility]) 05/07/2011   Base Of Post Neck - Clear   MYALGIA 10/23/2010   OSTEOPOROSIS 07/21/2007   Renal cell carcinoma (HCC) 09/07/2021   Right nephrectomy 09/2021. Dr. 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   Right Buccal Cheek (in situ)(CX35FU)   WEIGHT GAIN 06/08/2008    Past Surgical History:  Procedure Laterality Date   BREAST LUMPECTOMY  11/05/1974   benign   COLONOSCOPY      POLYPECTOMY     ROBOT ASSISTED LAPAROSCOPIC NEPHRECTOMY Left 09/27/2021   Procedure: XI ROBOTIC ASSISTED LAPAROSCOPIC RADICAL NEPHRECTOMY;  Surgeon: Rene Paci, MD;  Location: [facility];  Service: Urology;  Laterality: Left;   SKIN CANCER EXCISION     THYROID SURGERY  11/05/2009   left nodule removed   VAGINAL DELIVERY     x1   VAGINAL HYSTERECTOMY  11/05/1972    Current Hospital Medications:  Home Meds:  Current Meds  Medication Sig   docusate sodium (COLACE) 100 MG capsule Take 100 mg by mouth daily as needed for mild constipation.   EPINEPHrine 0.3 mg/0.3 mL IJ SOAJ injection Inject 0.3 mg into the muscle as needed for anaphylaxis.   olmesartan (BENICAR) 40 MG tablet TAKE 1 TABLET BY MOUTH EVERY DAY   rosuvastatin (CRESTOR) 5 MG tablet TAKE 5MG  BY MOUTH EVERY OTHER DAY   SYMBICORT 80-4.5 MCG/ACT inhaler Inhale 2 puffs into the lungs daily as needed (allergies).    Scheduled Meds:  dexamethasone (DECADRON) injection  4 mg Intravenous On Call to OR   Continuous Infusions:  lactated ringers 10 mL/hr at 04/10/23 1105   lactated ringers 10 mL/hr at 04/10/23 1101   PRN Meds:.  Allergies:  Allergies  Allergen Reactions   Bee Venom Hives   Atorvastatin     REACTION: dental pain, myalgias, and headache   Lisinopril Cough  Losartan Potassium     REACTION: drowsiness   Shrimp Extract Hives    Breaks out in welts   Latex Itching, Rash and Other (See Comments)     scars   Other Hives and Rash    Tape. Tape - scars, itching from latex tape   Sulfonamide Derivatives Hives and Rash    REACTION: Rash, Hives, Asthma    Family History  Problem Relation Age of Onset   Cancer Mother        Ovarian Cancer   Ovarian cancer Mother    Hypertension Mother    Thyroid disease Mother    Lymphoma Sister    Cancer Sister        Breast Cancer, Kidney Cancer   Leukemia Sister    Breast cancer Maternal Aunt    Colon cancer Neg Hx    Rectal cancer Neg Hx    Stomach  cancer Neg Hx    Endometrial cancer Neg Hx    Prostate cancer Neg Hx    Pancreatic cancer Neg Hx     Social History:  reports that she has never smoked. She has never used smokeless tobacco. She reports current alcohol use. She reports that she does not use drugs.  ROS: A complete review of systems was performed.  All systems are negative except for pertinent findings as noted.  Physical Exam:  Vital signs in last 24 hours: Temp:  [98.8 F (37.1 C)] 98.8 F (37.1 C) (06/05 1027) Pulse Rate:  [74] 74 (06/05 1027) Resp:  [16] 16 (06/05 1027) BP: (169)/(70) 169/70 (06/05 1027) SpO2:  [70 %] 70 % (06/05 1027) Weight:  [64.4 kg] 64.4 kg (06/05 1113) Constitutional:  Alert and oriented, No acute distress Cardiovascular: Regular rate and rhythm, No JVD Respiratory: Normal respiratory effort, Lungs clear bilaterally GI: Abdomen is soft, nontender, nondistended, no abdominal masses GU: No CVA tenderness Lymphatic: No lymphadenopathy Neurologic: Grossly intact, no focal deficits Psychiatric: Normal mood and affect  Laboratory Data:  No results for input(s): "WBC", "HGB", "HCT", "PLT" in the last 72 hours.  No results for input(s): "NA", "K", "CL", "GLUCOSE", "BUN", "CALCIUM", "CREATININE" in the last 72 hours.  Invalid input(s): "CO3"   No results found for this or any previous visit (from the past 24 hour(s)). No results found for this or any previous visit (from the past 240 hour(s)).  Renal Function: Recent Labs    04/05/23 0828  CREATININE 1.16*   Estimated Creatinine Clearance: 32.4 mL/min (A) (by C-G formula based on SCr of 1.16 mg/dL (H)).  Radiologic Imaging: No results found.  I independently reviewed the above imaging studies.  Impression/Recommendation 82 year old female with left-sided adnexal mass undergoing robotic excision with Dr. Pricilla Holm  -The risk, benefits and alternatives of cystoscopy with ureteral firefly injection was discussed with the patient.   She voices understanding and wishes to proceed.  Rhoderick Moody, MD [facility] 04/10/2023, 11:34 AM

## 2023-04-10 NOTE — Op Note (Signed)
OPERATIVE NOTE  Pre-operative Diagnosis: Bilateral adnexal masses  Post-operative Diagnosis: same, benign bilateral ovarian tumors  Operation: Robotic-assisted laparoscopic bilateral salpingo-oophorectomy, lysis of adhesions and enterolysis for approximately 25 minutes Cystoscopy with ureteral ICG injection (Dr. Liliane Shi)  Surgeon: Eugene Garnet MD  Assistant Surgeon: Warner Mccreedy, NP (an NP assistant was necessary for tissue manipulation, management of robotic instrumentation, retraction and positioning due to the complexity of the case and hospital policies).   Anesthesia: GET  Urine Output: 75 cc  Operative Findings: On EUA, mobile mass within the cul de sac.  On intra-abdominal entry, normal upper abdominal survey.  Omentum with adhesions to the anterior abdominal wall and left pelvic sidewall.  Sigmoid colon adherent to the left abdominal sidewall.  Significant diverticulosis noted of the sigmoid colon.  No ascites.  Normal-appearing small bowel.  Right ovary replaced by a 3 cm multicystic lesion that was almost vesicular and grapelike in appearance.  No obvious evidence of endometriosis within the pelvis.  Normal-appearing right fallopian tube.  Left ovary is large measuring approximately 4-6 cm with a 6-8 cm secondary cystic lesion, smooth.  Bilateral ovaries completely free from the colon.  On frozen section, bilateral ovaries consistent with benign cystic lesions, perhaps fibroadenomas.  Estimated Blood Loss:  25 cc      Total IV Fluids: see I&O flowsheet         Specimens: bilateral tubes and ovaries, pelvic washings         Complications:  None apparent; patient tolerated the procedure well.         Disposition: PACU - hemodynamically stable.  Procedure Details  The patient was seen in the Holding Room. The risks, benefits, complications, treatment options, and expected outcomes were discussed with the patient.  The patient concurred with the proposed plan, giving informed  consent.  The site of surgery properly noted/marked. The patient was identified as Sofiana Eichorn and the procedure verified as a Robotic-assisted bilateral salpingo-oophorectomy with any other indicated procedures.   After induction of anesthesia, the patient was draped and prepped in the usual sterile manner. Patient was placed in supine position after anesthesia and draped and prepped in the usual sterile manner as follows: Her arms were tucked to her side with all appropriate precautions.  The shoulders were stabilized with padded shoulder blocks applied to the acromium processes.  The patient was placed in the semi-lithotomy position in [place] stirrups.  The perineum and vagina were prepped with Betadine. The patient's abdomen was prepped with ChloraPrep and then she was draped after the prep had been allowed to dry for 3 minutes.  A Time Out was held and the above information confirmed.  Dr. Liliane Shi first performed cystoscopy with ureteral ICG injection.  OG was placed to suction.  Next, a 10 mm skin incision was made 1 cm below the subcostal margin in the midclavicular line.  The 5 mm Optiview port and scope was used for direct entry.  Opening pressure was under 10 mm CO2.  The abdomen was insufflated and the findings were noted as above.   At this point and all points during the procedure, the patient's intra-abdominal pressure did not exceed 15 mmHg. Next, an 8 mm skin incision was made superior to the umbilicus and a right and left port were placed about 8 cm lateral to the robot port on the right and left side.  A fourth arm was placed on the right.  The 5 mm assist trocar was exchanged for a 10-12 mm port. All  ports were placed under direct visualization.  The patient was placed in steep Trendelenburg. The robot was docked in the normal manner. Pelvic washings were collected.  Attention was first turned to lysis of adhesion.  Combination of blunt dissection, sharp dissection, and short bursts of  monopolar electrocautery were used to free the omentum from its attachments to the anterior abdominal wall and pelvic abdominal wall.  Sharp dissection was used to lyse adhesions of the sigmoid colon to the left sidewall.  The right and left peritoneum were opened parallel to the IP ligament to open the retroperitoneal spaces bilaterally. The round ligaments were preserved. The ureter was noted to be on the medial leaf of the broad ligament.  The peritoneum above the ureter was incised and stretched and the infundibulopelvic ligament was skeletonized, cauterized and cut.  Peritoneal attachments and attachments to the remanent round ligament were cauterized and transected, freeing each adnexa.  Bilateral ovaries were placed in Endo Catch bags.  Irrigation was used and excellent hemostasis was achieved.  At this point in the procedure was completed.  Robotic instruments were removed under direct visulaization.  The robot was undocked.   Each Endo Catch bag was brought out through the assist trocar incision.  Ovarian cysts were drained with clear fluid noted.  Once decompressed, bilateral tubes and ovaries were removed in a contained fashion and sent to pathology for frozen section.  Frozen section returned, the fascia at the 10-12 mm port was closed with 0 Vicryl using PMI fascial closure device under direct visualization.  The subcuticular tissue was closed with 4-0 Vicryl and the skin was closed with 4-0 Monocryl in a subcuticular manner.  Dermabond was applied.    Foley catheter was removed.  All sponge, lap and needle counts were correct x  3.   The patient was transferred to the recovery room in stable. condition.  Eugene Garnet, MD

## 2023-04-11 ENCOUNTER — Telehealth: Payer: Self-pay | Admitting: *Deleted

## 2023-04-11 ENCOUNTER — Encounter (HOSPITAL_COMMUNITY): Payer: Self-pay | Admitting: Gynecologic Oncology

## 2023-04-11 NOTE — Telephone Encounter (Signed)
Spoke with Donna Andersen this morning. She states she is eating, drinking and urinating well. She has not had a BM yet but is passing gas. She is taking senokot as prescribed and encouraged her to drink plenty of water. She denies fever or chills. Incisions are dry and intact. She rates her pain 2/10. Her pain is controlled with tylenol only.     Instructed to call office with any fever, chills, purulent drainage, uncontrolled pain or any other questions or concerns. Patient verbalizes understanding.   Pt aware of post op appointments as well as the office number [phone] and after hours number [phone] to call if she has any questions or concerns

## 2023-04-12 LAB — SURGICAL PATHOLOGY

## 2023-04-15 LAB — CYTOLOGY - NON PAP

## 2023-04-17 ENCOUNTER — Inpatient Hospital Stay: Payer: Medicare HMO | Attending: Gynecologic Oncology | Admitting: Gynecologic Oncology

## 2023-04-17 ENCOUNTER — Encounter: Payer: Self-pay | Admitting: Gynecologic Oncology

## 2023-04-17 DIAGNOSIS — D271 Benign neoplasm of left ovary: Secondary | ICD-10-CM | POA: Insufficient documentation

## 2023-04-17 DIAGNOSIS — Z90722 Acquired absence of ovaries, bilateral: Secondary | ICD-10-CM

## 2023-04-17 DIAGNOSIS — Z7189 Other specified counseling: Secondary | ICD-10-CM

## 2023-04-17 DIAGNOSIS — D270 Benign neoplasm of right ovary: Secondary | ICD-10-CM | POA: Insufficient documentation

## 2023-04-17 NOTE — Progress Notes (Signed)
Gynecologic Oncology Telehealth Note: Gyn-Onc  I connected with [name] on 04/17/23 at  4:00 PM EDT by telephone and verified that I am speaking with the correct person using two identifiers.  I discussed the limitations, risks, security and privacy concerns of performing an evaluation and management service by telemedicine and the availability of in-person appointments. I also discussed with the patient that there may be a patient responsible charge related to this service. The patient expressed understanding and agreed to proceed.  Other persons participating in the visit and their role in the encounter: none.  Patient's location: home Provider's location: [facility]  Reason for Visit: follow-up after surgery  Treatment History: Oncology History  Clear cell renal cell carcinoma, left (HCC)  09/07/2021 Initial Diagnosis   Clear cell renal cell carcinoma, left (HCC)   01/09/2022 - 06/05/2022 Chemotherapy   Patient is on Treatment Plan : HEAD/NECK Pembrolizumab (200) q21d     01/09/2022 - 06/26/2022 Chemotherapy   Patient is on Treatment Plan : HEAD/NECK Pembrolizumab (200) q21d       Interval History: Doing well, little sore. Itching around incisions. Denies rash. Bowels doing well. Denies urinary symptoms.  Past Medical/Surgical History: Past Medical History:  Diagnosis Date   Acute cystitis 09/26/2007   recurrent UTI's   Allergy    Asthma    as a child   Atypical nevus 11/17/2002   Upper Center Back - Moderate   Atypical nevus 11/17/2002   Right Lower Abdomen - Moderate   Atypical nevus 04/23/2006   Left Upper Arm - Slight to Moderate   Atypical nevus 04/23/2006   Left Outer Lower Leg - Slight to Moderate   Atypical nevus 04/15/2012   Left Upper Buttock - Mild   Atypical nevus 08/25/2014   Right Inner Knee - Moderate   Atypical nevus 08/25/2014   Left Scapula - Severe   Basal cell carcinoma 08/25/2014   left axilla tx -curet and cautery   COUGH DUE TO ACE INHIBITORS  06/20/2009   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 Dr. 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   Right Buccal Cheek (in situ)([id number])   WEIGHT GAIN 06/08/2008    Past Surgical History:  Procedure Laterality Date   BREAST LUMPECTOMY  11/05/1974   benign   COLONOSCOPY     POLYPECTOMY     ROBOT ASSISTED LAPAROSCOPIC NEPHRECTOMY Left 09/27/2021   Procedure: XI ROBOTIC ASSISTED LAPAROSCOPIC RADICAL NEPHRECTOMY;  Surgeon: Rene Paci, MD;  Location: [facility];  Service: Urology;  Laterality: Left;   ROBOTIC ASSISTED SALPINGO OOPHERECTOMY Bilateral 04/10/2023   Procedure: XI ROBOTIC ASSISTED SALPINGO OOPHORECTOMY;  Surgeon: Carver Fila, MD;  Location: [facility];  Service: Gynecology;  Laterality: Bilateral;   SKIN CANCER EXCISION     THYROID SURGERY  11/05/2009   left nodule removed   VAGINAL DELIVERY     x1   VAGINAL HYSTERECTOMY  11/05/1972    Family History  Problem Relation Age of Onset   Cancer Mother  Ovarian Cancer   Ovarian cancer Mother    Hypertension Mother    Thyroid disease Mother    Lymphoma Sister    Cancer Sister        Breast Cancer, Kidney Cancer   Leukemia Sister    Breast cancer Maternal Aunt    Colon cancer Neg Hx    Rectal cancer Neg Hx    Stomach cancer Neg Hx    Endometrial cancer Neg Hx    Prostate cancer Neg Hx    Pancreatic cancer Neg Hx     Social History   Socioeconomic History   Marital status: Married    Spouse name: Not on file   Number of children: Not  on file   Years of education: Not on file   Highest education level: Not on file  Occupational History   Occupation: Homemaker    Employer: RETIRED  Tobacco Use   Smoking status: Never   Smokeless tobacco: Never  Vaping Use   Vaping Use: Never used  Substance and Sexual Activity   Alcohol use: Yes    Alcohol/week: 0.0 standard drinks of alcohol    Comment: socially-2 glasses a week   Drug use: Never   Sexual activity: Not Currently    Birth control/protection: Surgical  Other Topics Concern   Not on file  Social History Narrative   Does not work outside the home   Social Determinants of Health   Financial Resource Strain: Low Risk  (03/22/2022)   Overall Financial Resource Strain (CARDIA)    Difficulty of Paying Living Expenses: Not hard at all  Food Insecurity: No Food Insecurity (03/22/2022)   Hunger Vital Sign    Worried About Running Out of Food in the Last Year: Never true    Ran Out of Food in the Last Year: Never true  Transportation Needs: No Transportation Needs (03/22/2022)   PRAPARE - Administrator, Civil Service (Medical): No    Lack of Transportation (Non-Medical): No  Physical Activity: Sufficiently Active (03/22/2022)   Exercise Vital Sign    Days of Exercise per Week: 5 days    Minutes of Exercise per Session: 120 min  Stress: No Stress Concern Present (03/22/2022)   [employer] of Occupational Health - Occupational Stress Questionnaire    Feeling of Stress : Not at all  Social Connections: Moderately Integrated (03/22/2022)   Social Connection and Isolation Panel [NHANES]    Frequency of Communication with Friends and Family: More than three times a week    Frequency of Social Gatherings with Friends and Family: More than three times a week    Attends Religious Services: More than 4 times per year    Active Member of [employer] or Organizations: No    Attends Banker Meetings: Never    Marital Status: Married    Current  Medications:  Current Outpatient Medications:    docusate sodium (COLACE) 100 MG capsule, Take 100 mg by mouth daily as needed for mild constipation., Disp: , Rfl:    EPINEPHrine 0.3 mg/0.3 mL IJ SOAJ injection, Inject 0.3 mg into the muscle as needed for anaphylaxis., Disp: , Rfl:    olmesartan (BENICAR) 40 MG tablet, TAKE 1 TABLET BY MOUTH EVERY DAY, Disp: 90 tablet, Rfl: 3   rosuvastatin (CRESTOR) 5 MG tablet, TAKE 5MG  BY MOUTH EVERY OTHER DAY, Disp: 45 tablet, Rfl: 3   senna-docusate (SENOKOT-S) 8.6-50 MG tablet, Take 2 tablets by mouth at bedtime. For AFTER surgery, do not take if  having diarrhea, Disp: 30 tablet, Rfl: 0   SYMBICORT 80-4.5 MCG/ACT inhaler, Inhale 2 puffs into the lungs daily as needed (allergies)., Disp: , Rfl:    traMADol (ULTRAM) 50 MG tablet, Take 1 tablet (50 mg total) by mouth every 6 (six) hours as needed for severe pain. For AFTER surgery only, do not take and drive, Disp: 10 tablet, Rfl: 0  Review of Symptoms: Pertinent positives as per HPI.  Physical Exam: Deferred given limitations of phone visit.  Laboratory & Radiologic Studies: A. RIGHT OVARY AND FALLOPIAN TUBE, SALPINGO OOPHORECTOMY:  Benign multiloculated serous cystadenofibroma  Benign fallopian tubes  Negative for malignancy   B. LEFT OVARY AND FALLOPIAN TUBE, SALPINGO OOPHORECTOMY:  Benign multiloculated serous cystadenofibroma  Benign simple cyst  Benign fallopian tube  Negative for malignancy   Assessment & Plan: Donna Andersen is a 82 y.o. woman with s/p BSO with benign pathology.   Doing well. Reviewed continued restrictions and expectations. Discussed pathology again. All questions answered.  I discussed the assessment and treatment plan with the patient. The patient was provided with an opportunity to ask questions and all were answered. The patient agreed with the plan and demonstrated an understanding of the instructions.   The patient was advised to call back or see an in-person  evaluation if the symptoms worsen or if the condition fails to improve as anticipated.   6 minutes of total time was spent for this patient encounter, including preparation, phone counseling with the patient and coordination of care, and documentation of the encounter.   Eugene Garnet, MD  Division of Gynecologic Oncology  Department of Obstetrics and Gynecology  [facility]

## 2023-04-22 ENCOUNTER — Other Ambulatory Visit: Payer: Self-pay

## 2023-04-22 ENCOUNTER — Telehealth: Payer: Self-pay | Admitting: *Deleted

## 2023-04-22 ENCOUNTER — Inpatient Hospital Stay (HOSPITAL_BASED_OUTPATIENT_CLINIC_OR_DEPARTMENT_OTHER): Payer: Medicare HMO | Admitting: Gynecologic Oncology

## 2023-04-22 ENCOUNTER — Ambulatory Visit: Payer: Medicare HMO

## 2023-04-22 VITALS — BP 133/77 | HR 80 | Temp 98.3°F | Resp 16

## 2023-04-22 DIAGNOSIS — D270 Benign neoplasm of right ovary: Secondary | ICD-10-CM | POA: Diagnosis not present

## 2023-04-22 DIAGNOSIS — N9489 Other specified conditions associated with female genital organs and menstrual cycle: Secondary | ICD-10-CM

## 2023-04-22 DIAGNOSIS — D271 Benign neoplasm of left ovary: Secondary | ICD-10-CM | POA: Diagnosis not present

## 2023-04-22 DIAGNOSIS — L233 Allergic contact dermatitis due to drugs in contact with skin: Secondary | ICD-10-CM

## 2023-04-22 DIAGNOSIS — T8131XA Disruption of external operation (surgical) wound, not elsewhere classified, initial encounter: Secondary | ICD-10-CM

## 2023-04-22 LAB — AEROBIC CULTURE W GRAM STAIN (SUPERFICIAL SPECIMEN)

## 2023-04-22 MED ORDER — PREDNISONE 5 MG PO TABS
5.0000 mg | ORAL_TABLET | Freq: Every day | ORAL | 0 refills | Status: DC
Start: 2023-04-22 — End: 2023-05-30

## 2023-04-22 MED ORDER — HYDROXYZINE HCL 10 MG PO TABS
10.0000 mg | ORAL_TABLET | Freq: Three times a day (TID) | ORAL | 0 refills | Status: DC | PRN
Start: 2023-04-22 — End: 2023-09-27

## 2023-04-22 NOTE — Telephone Encounter (Signed)
Donna Andersen called the office with complaints of her abdominal incisions feeling "very itchy" and "red, like a bumpy rash". Patient states she is not putting anything on her incisions and two of the five are draining a clear, yellow drainage. Pt states the top layer of the dermabond looks like it has dissolved.  Pt is taking benadryl po for the itching. Patient denies pain, fever and or chills.  Advised patient that a provider would be notified with recommendations.   Appointment was made for Donna Andersen this afternoon at 2:45 pm with Warner Mccreedy, NP. Pt. Agrees to date and time.

## 2023-04-22 NOTE — Progress Notes (Unsigned)
Patient reports having this abdominal incisional reaction since surgery.  She reports significant itching with little relief when taking Benadryl.  She has had drainage from the incisions and blisters around each incision.  She is unable to sleep due to the constant itching.  No fever or chills.  Bowels and bladder functioning without difficulty.  She states he typically does well with prednisone 5 mg no higher than this.  She will plan to alternate down to 2.5.

## 2023-04-22 NOTE — Patient Instructions (Addendum)
Based on your exam, it appears you have contact dermatitis around your incisions.   We will let you know when the culture returns, but we feel this is less likely an infection.  Plan on starting prednisone 5 mg daily. You can begin tapering when you feel your symptoms are improved by alternating 5 mg to 2.5 mg then stopping.  You can apply the steroid cream around the incisions that you have at home.

## 2023-04-23 DIAGNOSIS — L259 Unspecified contact dermatitis, unspecified cause: Secondary | ICD-10-CM | POA: Insufficient documentation

## 2023-04-23 LAB — AEROBIC CULTURE W GRAM STAIN (SUPERFICIAL SPECIMEN)

## 2023-04-24 ENCOUNTER — Telehealth: Payer: Self-pay | Admitting: *Deleted

## 2023-04-24 LAB — AEROBIC CULTURE W GRAM STAIN (SUPERFICIAL SPECIMEN): Gram Stain: NONE SEEN

## 2023-04-24 NOTE — Telephone Encounter (Signed)
-----   Message from Doylene Bode, NP sent at 04/24/2023 10:44 AM EDT ----- Please call her and check in to see how she is doing and how the abdominal rash is. Please let her know the wound culture has showed no infection so far which is what we expected.  ----- Message ----- From: Interface, Lab In [place] Sent: 04/22/2023   8:05 PM EDT To: Doylene Bode, NP

## 2023-04-24 NOTE — Telephone Encounter (Signed)
-----   Message from Melissa D Cross, NP sent at 04/24/2023 10:44 AM EDT ----- Please call her and check in to see how she is doing and how the abdominal rash is. Please let her know the wound culture has showed no infection so far which is what we expected.  ----- Message ----- From: Interface, Lab In [place] Sent: 04/22/2023   8:05 PM EDT To: Melissa D Cross, NP   

## 2023-04-24 NOTE — Telephone Encounter (Signed)
Attempted to reach out to Donna Andersen for follow up on her abdominal incisions and to relay message from Warner Mccreedy, NP that the wound culture has showed no infection so far and this is what we expected. Left a voicemail for patient to call the office back at [phone].

## 2023-04-24 NOTE — Telephone Encounter (Signed)
Donna Andersen returned the office's call. Patient states she is doing much better. Pt states, "The areas on my stomach Incisions are still a little red, but they are healing just fine."  Pt states she would call the office back on Monday after returning home from the [place] if she has any questions or concerns.

## 2023-04-29 DIAGNOSIS — C642 Malignant neoplasm of left kidney, except renal pelvis: Secondary | ICD-10-CM | POA: Diagnosis not present

## 2023-05-02 DIAGNOSIS — L719 Rosacea, unspecified: Secondary | ICD-10-CM | POA: Diagnosis not present

## 2023-05-02 DIAGNOSIS — D225 Melanocytic nevi of trunk: Secondary | ICD-10-CM | POA: Diagnosis not present

## 2023-05-02 DIAGNOSIS — L570 Actinic keratosis: Secondary | ICD-10-CM | POA: Diagnosis not present

## 2023-05-02 DIAGNOSIS — Z85820 Personal history of malignant melanoma of skin: Secondary | ICD-10-CM | POA: Diagnosis not present

## 2023-05-02 DIAGNOSIS — L821 Other seborrheic keratosis: Secondary | ICD-10-CM | POA: Diagnosis not present

## 2023-05-02 DIAGNOSIS — D485 Neoplasm of uncertain behavior of skin: Secondary | ICD-10-CM | POA: Diagnosis not present

## 2023-05-02 DIAGNOSIS — D239 Other benign neoplasm of skin, unspecified: Secondary | ICD-10-CM | POA: Diagnosis not present

## 2023-05-02 DIAGNOSIS — Z86018 Personal history of other benign neoplasm: Secondary | ICD-10-CM | POA: Diagnosis not present

## 2023-05-02 DIAGNOSIS — Z85828 Personal history of other malignant neoplasm of skin: Secondary | ICD-10-CM | POA: Diagnosis not present

## 2023-05-02 DIAGNOSIS — D171 Benign lipomatous neoplasm of skin and subcutaneous tissue of trunk: Secondary | ICD-10-CM | POA: Diagnosis not present

## 2023-05-07 ENCOUNTER — Encounter: Payer: Self-pay | Admitting: Gynecologic Oncology

## 2023-05-10 ENCOUNTER — Encounter: Payer: Self-pay | Admitting: Gynecologic Oncology

## 2023-05-10 ENCOUNTER — Inpatient Hospital Stay: Payer: Medicare HMO | Attending: Gynecologic Oncology | Admitting: Gynecologic Oncology

## 2023-05-10 ENCOUNTER — Other Ambulatory Visit: Payer: Self-pay

## 2023-05-10 VITALS — BP 144/75 | HR 71 | Temp 98.4°F | Resp 17 | Wt 145.0 lb

## 2023-05-10 DIAGNOSIS — D271 Benign neoplasm of left ovary: Secondary | ICD-10-CM

## 2023-05-10 DIAGNOSIS — Z90722 Acquired absence of ovaries, bilateral: Secondary | ICD-10-CM

## 2023-05-10 DIAGNOSIS — D270 Benign neoplasm of right ovary: Secondary | ICD-10-CM

## 2023-05-10 DIAGNOSIS — N9489 Other specified conditions associated with female genital organs and menstrual cycle: Secondary | ICD-10-CM

## 2023-05-10 NOTE — Patient Instructions (Signed)
It was good to see you today.  You are healing very well from surgery.  Please remember, no heavy lifting for 6 weeks.    Please do not hesitate to call if you need anything in the future.

## 2023-05-10 NOTE — Progress Notes (Signed)
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 In 09/2021, she underwent robotic left radical nephrectomy. She then received 9 cycles of adjuvant IO with Pembrolizumab, completed in 06/2022.   CT of the chest and abdomen on 08/22/2022 shows stable exam post left nephrectomy without evidence of local recurrence.  No evidence of metastatic disease within the chest or abdomen.  Stable right renal lesions noted.  Partially visualized multiseptated 10.1 x 6.3 cm fluid density in the left anterior abdomen appearing to be in continuity with the sigmoid colon, visualized portions of which are slightly increased in comparison to prior CT scan.  In retrospect visible only on coronal sequences of images from prior MRI dated August 15, 2021.  On this MRI, thin internal enhancing septation without definitive soft tissue nodularity or wall septal thickening.  Stable small bilateral pulmonary nodules.  Left-sided colonic diverticulosis without findings of acute diverticulitis.   MRI of the pelvis on 09/22/2022 shows a 10.7 cm complex cystic lesion in the left adnexal region, suspicious for cystic ovarian neoplasm although no malignant features are visualized.  3.2 cm complex cystic lesion is seen in the right adnexa, again no malignant features identified.  No evidence of pelvic metastatic disease including no peritoneal thickening or adenopathy.  Severe sigmoid diverticulosis noted without  diverticulitis.   Her last colonoscopy was in 2017.  She was told at that time she needed follow-up in 7 years.  She was seen by GI last year and told that she could defer colonoscopy if she wanted.  She ultimately decided to defer colonoscopy.   Tumor markers 10/09/22: CEA - 1.30 CA-125 - 15.8  04/10/23: Robotic-assisted laparoscopic bilateral salpingo-oophorectomy, lysis of adhesions and enterolysis for approximately 25 minutes. Cystoscopy with ureteral ICG injection (Dr. Liliane Shi).  Interval History: Doing well.  Denies any significant abdominal pain.  Abdominal bloating is much better.  Reports baseline bowel bladder function.  Notes itching around her incisions has completely resolved.  She is seen at dermatologist, this suspicion that the patient may have a reaction to suture (Vicryl) although the Monocryl was what was used on her skin and I think may be more likely the culprit versus the Dermabond.  Past Medical/Surgical History: Past Medical History:  Diagnosis Date   Acute cystitis 09/26/2007   recurrent UTI's   Allergy    Asthma    as a child   Atypical nevus 11/17/2002   Upper Center Back - Moderate   Atypical nevus 11/17/2002   Right Lower Abdomen - Moderate   Atypical nevus 04/23/2006   Left Upper Arm - Slight to Moderate   Atypical nevus 04/23/2006   Left Outer Lower Leg - Slight to Moderate   Atypical nevus 04/15/2012   Left Upper Buttock - Mild   Atypical nevus 08/25/2014   Right Inner Knee - Moderate   Atypical nevus 08/25/2014   Left Scapula - Severe   Basal cell carcinoma 08/25/2014   left axilla tx -curet and cautery   COUGH DUE  TO ACE INHIBITORS 06/20/2009   FIBROCYSTIC BREAST DISEASE 10/10/2009   GOITER, MULTINODULAR 10/11/2009   Benign L thyroid nodule   HEMATURIA UNSPECIFIED 10/05/2008   HYPERGLYCEMIA 09/26/2007   HYPERLIPIDEMIA 07/21/2007   HYPERTENSION 11/22/2008   INSOMNIA 06/08/2008   Melanoma in situ (HCC) 03/07/2011   Base Of Post Neck    Melanoma in situ [facility]) 05/07/2011   Base Of Post Neck - Clear   MYALGIA 10/23/2010   OSTEOPOROSIS 07/21/2007   Renal cell carcinoma (HCC) 09/07/2021   Right nephrectomy 09/2021. Dr. 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   Right Buccal Cheek (in situ)([id number])   WEIGHT GAIN 06/08/2008    Past Surgical History:  Procedure Laterality Date   BREAST LUMPECTOMY  11/05/1974   benign   COLONOSCOPY     POLYPECTOMY     ROBOT ASSISTED LAPAROSCOPIC NEPHRECTOMY Left 09/27/2021   Procedure: XI ROBOTIC ASSISTED LAPAROSCOPIC RADICAL NEPHRECTOMY;  Surgeon: Rene Paci, MD;  Location: [facility];  Service: Urology;  Laterality: Left;   ROBOTIC ASSISTED SALPINGO OOPHERECTOMY Bilateral 04/10/2023   Procedure: XI ROBOTIC ASSISTED SALPINGO OOPHORECTOMY;  Surgeon: Carver Fila, MD;  Location: [facility];  Service: Gynecology;  Laterality: Bilateral;   SKIN CANCER EXCISION     THYROID SURGERY  11/05/2009   left nodule removed   VAGINAL DELIVERY     x1   VAGINAL HYSTERECTOMY  11/05/1972    Family History  Problem Relation Age of Onset   Cancer Mother        Ovarian Cancer   Ovarian cancer Mother    Hypertension Mother    Thyroid disease Mother    Lymphoma Sister    Cancer Sister        Breast Cancer, Kidney Cancer   Leukemia Sister    Breast cancer Maternal Aunt    Colon cancer Neg Hx    Rectal cancer Neg Hx    Stomach cancer Neg Hx    Endometrial cancer Neg Hx    Prostate cancer Neg Hx    Pancreatic cancer Neg Hx     Social History   Socioeconomic History   Marital status: Married    Spouse name: Not on file   Number of children: Not on file   Years of education: Not on file   Highest education level: Not on file  Occupational History   Occupation: Homemaker    Employer: RETIRED  Tobacco Use   Smoking status: Never   Smokeless tobacco: Never  Vaping Use   Vaping Use: Never used  Substance and Sexual Activity   Alcohol use: Yes     Alcohol/week: 0.0 standard drinks of alcohol    Comment: socially-2 glasses a week   Drug use: Never   Sexual activity: Not Currently    Birth control/protection: Surgical  Other Topics Concern   Not on file  Social History Narrative   Does not work outside the home   Social Determinants of Health   Financial Resource Strain: Low Risk  (03/22/2022)   Overall Financial Resource Strain (CARDIA)    Difficulty of Paying Living Expenses: Not hard at all  Food Insecurity: No Food Insecurity (03/22/2022)   Hunger Vital Sign    Worried About Running Out of Food in the Last Year: Never true    Ran Out of Food in the Last Year: Never true  Transportation Needs: No Transportation Needs (03/22/2022)   PRAPARE - Administrator, Civil Service (Medical): No  Lack of Transportation (Non-Medical): No  Physical Activity: Sufficiently Active (03/22/2022)   Exercise Vital Sign    Days of Exercise per Week: 5 days    Minutes of Exercise per Session: 120 min  Stress: No Stress Concern Present (03/22/2022)   [employer] of Occupational Health - Occupational Stress Questionnaire    Feeling of Stress : Not at all  Social Connections: Moderately Integrated (03/22/2022)   Social Connection and Isolation Panel [NHANES]    Frequency of Communication with Friends and Family: More than three times a week    Frequency of Social Gatherings with Friends and Family: More than three times a week    Attends Religious Services: More than 4 times per year    Active Member of [employer] or Organizations: No    Attends Engineer, structural: Never    Marital Status: Married    Current Medications:  Current Outpatient Medications:    rosuvastatin (CRESTOR) 5 MG tablet, TAKE 5MG  BY MOUTH EVERY OTHER DAY, Disp: 45 tablet, Rfl: 3   docusate sodium (COLACE) 100 MG capsule, Take 100 mg by mouth daily as needed for mild constipation., Disp: , Rfl:    EPINEPHrine 0.3 mg/0.3 mL IJ SOAJ injection, Inject  0.3 mg into the muscle as needed for anaphylaxis., Disp: , Rfl:    hydrOXYzine (ATARAX) 10 MG tablet, Take 1 tablet (10 mg total) by mouth every 8 (eight) hours as needed for itching. Use caution. May cause drowsiness, Disp: 20 tablet, Rfl: 0   olmesartan (BENICAR) 40 MG tablet, TAKE 1 TABLET BY MOUTH EVERY DAY, Disp: 90 tablet, Rfl: 3   predniSONE (DELTASONE) 5 MG tablet, Take 1 tablet (5 mg total) by mouth daily with breakfast., Disp: 15 tablet, Rfl: 0   senna-docusate (SENOKOT-S) 8.6-50 MG tablet, Take 2 tablets by mouth at bedtime. For AFTER surgery, do not take if having diarrhea, Disp: 30 tablet, Rfl: 0   SYMBICORT 80-4.5 MCG/ACT inhaler, Inhale 2 puffs into the lungs daily as needed (allergies)., Disp: , Rfl:    traMADol (ULTRAM) 50 MG tablet, Take 1 tablet (50 mg total) by mouth every 6 (six) hours as needed for severe pain. For AFTER surgery only, do not take and drive, Disp: 10 tablet, Rfl: 0  Review of Systems: Denies appetite changes, fevers, chills, fatigue, unexplained weight changes. Denies hearing loss, neck lumps or masses, mouth sores, ringing in ears or voice changes. Denies cough or wheezing.  Denies shortness of breath. Denies chest pain or palpitations. Denies leg swelling. Denies abdominal distention, pain, blood in stools, constipation, diarrhea, nausea, vomiting, or early satiety. Denies pain with intercourse, dysuria, frequency, hematuria or incontinence. Denies hot flashes, pelvic pain, vaginal bleeding or vaginal discharge.   Denies joint pain, back pain or muscle pain/cramps. Denies itching, rash, or wounds. Denies dizziness, headaches, numbness or seizures. Denies swollen lymph nodes or glands, denies easy bruising or bleeding. Denies anxiety, depression, confusion, or decreased concentration.  Physical Exam: BP (!) 160/71 (BP Location: Left Arm, Patient Position: Sitting)   Pulse 71   Temp 98.4 F (36.9 C) (Oral)   Resp 17   Wt 145 lb (65.8 kg)   LMP  (LMP  Unknown)   SpO2 99%   BMI 28.32 kg/m  General: Alert, oriented, no acute distress. HEENT: Normocephalic, atraumatic, sclera anicteric. Chest: Unlabored breathing on room air. Abdomen: soft, nontender.  Normoactive bowel sounds.  No masses or hepatosplenomegaly appreciated.  Well-healed visions.  No surrounding erythema.  Laboratory & Radiologic Studies: A. RIGHT OVARY  AND FALLOPIAN TUBE, SALPINGO OOPHORECTOMY:  Benign multiloculated serous cystadenofibroma  Benign fallopian tubes  Negative for malignancy   B. LEFT OVARY AND FALLOPIAN TUBE, SALPINGO OOPHORECTOMY:  Benign multiloculated serous cystadenofibroma  Benign simple cyst  Benign fallopian tube  Negative for malignancy   Assessment & Plan: Donna Andersen is a 82 y.o. woman s/p robotic BSO, lysis of adhesion and enterolysis in the setting of bilateral adnexal masses.  Final pathology benign.  The patient is overall doing very well.  Reviewed continued expectations and restrictions.  Copy of the pathology report given to the patient.  She will be released back to her healthcare team.  14 minutes of total time was spent for this patient encounter, including preparation, face-to-face counseling with the patient and coordination of care, and documentation of the encounter.  Eugene Garnet, MD  Division of Gynecologic Oncology  Department of Obstetrics and Gynecology  [facility]

## 2023-05-14 ENCOUNTER — Other Ambulatory Visit: Payer: Self-pay | Admitting: Sports Medicine

## 2023-05-14 DIAGNOSIS — M25562 Pain in left knee: Secondary | ICD-10-CM | POA: Diagnosis not present

## 2023-05-30 ENCOUNTER — Ambulatory Visit (INDEPENDENT_AMBULATORY_CARE_PROVIDER_SITE_OTHER): Payer: Medicare HMO

## 2023-05-30 VITALS — Wt 145.0 lb

## 2023-05-30 DIAGNOSIS — Z0000 Encounter for general adult medical examination without abnormal findings: Secondary | ICD-10-CM

## 2023-05-30 NOTE — Patient Instructions (Signed)
Ms. Donna Andersen , Thank you for taking time to come for your Medicare Wellness Visit. I appreciate your ongoing commitment to your health goals. Please review the following plan we discussed and let me know if I can assist you in the future.   Referrals/Orders/Follow-Ups/Clinician Recommendations: pt stated bone density will be addressed at next visit, goals are to lose a little weight   This is a list of the screening recommended for you and due dates:  Health Maintenance  Topic Date Due   DTaP/Tdap/Td vaccine (1 - Tdap) Never done   DEXA scan (bone density measurement)  04/14/2021   COVID-19 Vaccine (6 - 2023-24 season) 07/06/2022   Mammogram  01/08/2024   Medicare Annual Wellness Visit  05/29/2024   Pneumonia Vaccine  Completed   HPV Vaccine  Aged Out   Flu Shot  Discontinued   Colon Cancer Screening  Discontinued   Hepatitis C Screening  Discontinued   Zoster (Shingles) Vaccine  Discontinued    Advanced directives: copies in chart   Next Medicare Annual Wellness Visit scheduled for next year: Yes  Preventive Care 65 Years and Older, Female Preventive care refers to lifestyle choices and visits with your health care provider that can promote health and wellness. What does preventive care include? A yearly physical exam. This is also called an annual well check. Dental exams once or twice a year. Routine eye exams. Ask your health care provider how often you should have your eyes checked. Personal lifestyle choices, including: Daily care of your teeth and gums. Regular physical activity. Eating a healthy diet. Avoiding tobacco and drug use. Limiting alcohol use. Practicing safe sex. Taking low-dose aspirin every day. Taking vitamin and mineral supplements as recommended by your health care provider. What happens during an annual well check? The services and screenings done by your health care provider during your annual well check will depend on your age, overall health,  lifestyle risk factors, and family history of disease. Counseling  Your health care provider may ask you questions about your: Alcohol use. Tobacco use. Drug use. Emotional well-being. Home and relationship well-being. Sexual activity. Eating habits. History of falls. Memory and ability to understand (cognition). Work and work Astronomer. Reproductive health. Screening  You may have the following tests or measurements: Height, weight, and BMI. Blood pressure. Lipid and cholesterol levels. These may be checked every 5 years, or more frequently if you are over 76 years old. Skin check. Lung cancer screening. You may have this screening every year starting at age 2 if you have a 30-pack-year history of smoking and currently smoke or have quit within the past 15 years. Fecal occult blood test (FOBT) of the stool. You may have this test every year starting at age 7. Flexible sigmoidoscopy or colonoscopy. You may have a sigmoidoscopy every 5 years or a colonoscopy every 10 years starting at age 32. Hepatitis C blood test. Hepatitis B blood test. Sexually transmitted disease (STD) testing. Diabetes screening. This is done by checking your blood sugar (glucose) after you have not eaten for a while (fasting). You may have this done every 1-3 years. Bone density scan. This is done to screen for osteoporosis. You may have this done starting at age 63. Mammogram. This may be done every 1-2 years. Talk to your health care provider about how often you should have regular mammograms. Talk with your health care provider about your test results, treatment options, and if necessary, the need for more tests. Vaccines  Your health care  provider may recommend certain vaccines, such as: Influenza vaccine. This is recommended every year. Tetanus, diphtheria, and acellular pertussis (Tdap, Td) vaccine. You may need a Td booster every 10 years. Zoster vaccine. You may need this after age  72. Pneumococcal 13-valent conjugate (PCV13) vaccine. One dose is recommended after age 69. Pneumococcal polysaccharide (PPSV23) vaccine. One dose is recommended after age 30. Talk to your health care provider about which screenings and vaccines you need and how often you need them. This information is not intended to replace advice given to you by your health care provider. Make sure you discuss any questions you have with your health care provider. Document Released: 11/18/2015 Document Revised: 07/11/2016 Document Reviewed: 08/23/2015 Elsevier Interactive Patient Education  2017 [employer].  Fall Prevention in the Home Falls can cause injuries. They can happen to people of all ages. There are many things you can do to make your home safe and to help prevent falls. What can I do on the outside of my home? Regularly fix the edges of walkways and driveways and fix any cracks. Remove anything that might make you trip as you walk through a door, such as a raised step or threshold. Trim any bushes or trees on the path to your home. Use bright outdoor lighting. Clear any walking paths of anything that might make someone trip, such as rocks or tools. Regularly check to see if handrails are loose or broken. Make sure that both sides of any steps have handrails. Any raised decks and porches should have guardrails on the edges. Have any leaves, snow, or ice cleared regularly. Use sand or salt on walking paths during winter. Clean up any spills in your garage right away. This includes oil or grease spills. What can I do in the bathroom? Use night lights. Install grab bars by the toilet and in the tub and shower. Do not use towel bars as grab bars. Use non-skid mats or decals in the tub or shower. If you need to sit down in the shower, use a plastic, non-slip stool. Keep the floor dry. Clean up any water that spills on the floor as soon as it happens. Remove soap buildup in the tub or shower  regularly. Attach bath mats securely with double-sided non-slip rug tape. Do not have throw rugs and other things on the floor that can make you trip. What can I do in the bedroom? Use night lights. Make sure that you have a light by your bed that is easy to reach. Do not use any sheets or blankets that are too big for your bed. They should not hang down onto the floor. Have a firm chair that has side arms. You can use this for support while you get dressed. Do not have throw rugs and other things on the floor that can make you trip. What can I do in the kitchen? Clean up any spills right away. Avoid walking on wet floors. Keep items that you use a lot in easy-to-reach places. If you need to reach something above you, use a strong step stool that has a grab bar. Keep electrical cords out of the way. Do not use floor polish or wax that makes floors slippery. If you must use wax, use non-skid floor wax. Do not have throw rugs and other things on the floor that can make you trip. What can I do with my stairs? Do not leave any items on the stairs. Make sure that there are handrails on both  sides of the stairs and use them. Fix handrails that are broken or loose. Make sure that handrails are as long as the stairways. Check any carpeting to make sure that it is firmly attached to the stairs. Fix any carpet that is loose or worn. Avoid having throw rugs at the top or bottom of the stairs. If you do have throw rugs, attach them to the floor with carpet tape. Make sure that you have a light switch at the top of the stairs and the bottom of the stairs. If you do not have them, ask someone to add them for you. What else can I do to help prevent falls? Wear shoes that: Do not have high heels. Have rubber bottoms. Are comfortable and fit you well. Are closed at the toe. Do not wear sandals. If you use a stepladder: Make sure that it is fully opened. Do not climb a closed stepladder. Make sure that  both sides of the stepladder are locked into place. Ask someone to hold it for you, if possible. Clearly mark and make sure that you can see: Any grab bars or handrails. First and last steps. Where the edge of each step is. Use tools that help you move around (mobility aids) if they are needed. These include: Canes. Walkers. Scooters. Crutches. Turn on the lights when you go into a dark area. Replace any light bulbs as soon as they burn out. Set up your furniture so you have a clear path. Avoid moving your furniture around. If any of your floors are uneven, fix them. If there are any pets around you, be aware of where they are. Review your medicines with your doctor. Some medicines can make you feel dizzy. This can increase your chance of falling. Ask your doctor what other things that you can do to help prevent falls. This information is not intended to replace advice given to you by your health care provider. Make sure you discuss any questions you have with your health care provider. Document Released: 08/18/2009 Document Revised: 03/29/2016 Document Reviewed: 11/26/2014 Elsevier Interactive Patient Education  2017 [employer].

## 2023-05-30 NOTE — Progress Notes (Signed)
Subjective:   Donna Andersen is a 81 y.o. female who presents for Medicare Annual (Subsequent) preventive examination.  Per patient no change in vitals since last visit; unable to obtain new vitals due to this being a telehealth visit.    Visit Complete: Virtual  I connected with  Donna Andersen on 05/30/23 by a audio enabled telemedicine application and verified that I am speaking with the correct person using two identifiers.  Patient Location: Home  Provider Location: Office/Clinic  I discussed the limitations of evaluation and management by telemedicine. The patient expressed understanding and agreed to proceed.   Review of Systems     Cardiac Risk Factors include: advanced age (>38men, >72 women);dyslipidemia;hypertension     Objective:    Today's Vitals   05/30/23 1135  Weight: 145 lb (65.8 kg)   Body mass index is 28.32 kg/m.     05/30/2023   11:40 AM 05/07/2023    3:10 PM 04/10/2023   11:09 AM 04/05/2023    8:14 AM 03/04/2023    8:03 AM 02/26/2023    4:15 PM 12/24/2022    2:05 PM  Advanced Directives  Does Patient Have a Medical Advance Directive? Yes Yes Yes Yes Yes Yes   Type of Estate agent of [place];Living will Healthcare Power of [place];Living will Healthcare Power of [place];Living will Healthcare Power of [place];Living will Healthcare Power of [place];Living will    Does patient want to make changes to medical advance directive? No - Patient declined  No - Patient declined   No - Patient declined   Copy of Healthcare Power of Attorney in Chart? Yes - validated most recent copy scanned in chart (See row information) No - copy requested No - copy requested  Yes - validated most recent copy scanned in chart (See row information) Yes - validated most recent copy scanned in chart (See row information) Yes - validated most recent copy scanned in chart (See row information)    Current Medications (verified) Outpatient Encounter  Medications as of 05/30/2023  Medication Sig   docusate sodium (COLACE) 100 MG capsule Take 100 mg by mouth daily as needed for mild constipation.   hydrOXYzine (ATARAX) 10 MG tablet Take 1 tablet (10 mg total) by mouth every 8 (eight) hours as needed for itching. Use caution. May cause drowsiness   olmesartan (BENICAR) 40 MG tablet TAKE 1 TABLET BY MOUTH EVERY DAY   rosuvastatin (CRESTOR) 5 MG tablet TAKE 5MG  BY MOUTH EVERY OTHER DAY   senna-docusate (SENOKOT-S) 8.6-50 MG tablet Take 2 tablets by mouth at bedtime. For AFTER surgery, do not take if having diarrhea   SYMBICORT 80-4.5 MCG/ACT inhaler Inhale 2 puffs into the lungs daily as needed (allergies).   EPINEPHrine 0.3 mg/0.3 mL IJ SOAJ injection Inject 0.3 mg into the muscle as needed for anaphylaxis. (Patient not taking: Reported on 05/30/2023)   [DISCONTINUED] predniSONE (DELTASONE) 5 MG tablet Take 1 tablet (5 mg total) by mouth daily with breakfast.   No facility-administered encounter medications on file as of 05/30/2023.    Allergies (verified) Bee venom, Atorvastatin, Lisinopril, Losartan potassium, Shrimp extract, Latex, Other, and Sulfonamide derivatives   History: Past Medical History:  Diagnosis Date   Acute cystitis 09/26/2007   recurrent UTI's   Allergy    Asthma    as a child   Atypical nevus 11/17/2002   Upper Center Back - Moderate   Atypical nevus 11/17/2002   Right Lower Abdomen - Moderate   Atypical nevus 04/23/2006  Left Upper Arm - Slight to Moderate   Atypical nevus 04/23/2006   Left Outer Lower Leg - Slight to Moderate   Atypical nevus 04/15/2012   Left Upper Buttock - Mild   Atypical nevus 08/25/2014   Right Inner Knee - Moderate   Atypical nevus 08/25/2014   Left Scapula - Severe   Basal cell carcinoma 08/25/2014   left axilla tx -curet and cautery   COUGH DUE TO ACE INHIBITORS 06/20/2009   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 Dr. 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   Right Buccal Cheek (in situ)([id number])   WEIGHT GAIN 06/08/2008   Past Surgical History:  Procedure Laterality Date   BREAST LUMPECTOMY  11/05/1974   benign   COLONOSCOPY     POLYPECTOMY     ROBOT ASSISTED LAPAROSCOPIC NEPHRECTOMY Left 09/27/2021   Procedure: XI ROBOTIC ASSISTED LAPAROSCOPIC RADICAL NEPHRECTOMY;  Surgeon: Rene Paci, MD;  Location: [facility];  Service: Urology;  Laterality: Left;   ROBOTIC ASSISTED SALPINGO OOPHERECTOMY Bilateral 04/10/2023   Procedure: XI ROBOTIC ASSISTED SALPINGO OOPHORECTOMY;  Surgeon: Carver Fila, MD;  Location: [facility];  Service: Gynecology;  Laterality: Bilateral;   SKIN CANCER EXCISION     THYROID SURGERY  11/05/2009   left nodule removed   VAGINAL DELIVERY     x1   VAGINAL HYSTERECTOMY  11/05/1972   Family History  Problem Relation Age of Onset   Cancer Mother        Ovarian Cancer   Ovarian cancer Mother    Hypertension Mother    Thyroid disease Mother    Lymphoma Sister    Cancer Sister        Breast Cancer, Kidney Cancer   Leukemia Sister    Breast cancer Maternal Aunt    Colon cancer Neg Hx    Rectal cancer Neg Hx    Stomach cancer Neg Hx    Endometrial cancer Neg Hx    Prostate cancer Neg Hx    Pancreatic cancer Neg Hx    Social History   Socioeconomic History   Marital status: Married    Spouse name: Not on file   Number of children: Not on file   Years of education: Not on file   Highest education level: Not on file  Occupational History   Occupation:  Homemaker    Employer: RETIRED  Tobacco Use   Smoking status: Never   Smokeless tobacco: Never  Vaping Use   Vaping status: Never Used  Substance and Sexual Activity   Alcohol use: Yes    Alcohol/week: 0.0 standard drinks of alcohol    Comment: socially-2 glasses a week   Drug use: Never   Sexual activity: Not Currently    Birth control/protection: Surgical  Other Topics Concern   Not on file  Social History Narrative   Does not work outside the home   Social Determinants of Health   Financial Resource Strain: Low Risk  (05/30/2023)   Overall Financial Resource Strain (CARDIA)  Difficulty of Paying Living Expenses: Not hard at all  Food Insecurity: No Food Insecurity (05/30/2023)   Hunger Vital Sign    Worried About Running Out of Food in the Last Year: Never true    Ran Out of Food in the Last Year: Never true  Transportation Needs: No Transportation Needs (05/30/2023)   PRAPARE - Administrator, Civil Service (Medical): No    Lack of Transportation (Non-Medical): No  Physical Activity: Sufficiently Active (05/30/2023)   Exercise Vital Sign    Days of Exercise per Week: 5 days    Minutes of Exercise per Session: 120 min  Stress: No Stress Concern Present (05/30/2023)   [employer] of Occupational Health - Occupational Stress Questionnaire    Feeling of Stress : Not at all  Social Connections: Moderately Integrated (05/30/2023)   Social Connection and Isolation Panel [NHANES]    Frequency of Communication with Friends and Family: Three times a week    Frequency of Social Gatherings with Friends and Family: More than three times a week    Attends Religious Services: More than 4 times per year    Active Member of [employer] or Organizations: No    Attends Engineer, structural: Never    Marital Status: Married    Tobacco Counseling Counseling given: Not Answered   Clinical Intake:  Pre-visit preparation completed: Yes  Pain : No/denies pain      BMI - recorded: 28.32 Nutritional Status: BMI 25 -29 Overweight Nutritional Risks: None Diabetes: No  How often do you need to have someone help you when you read instructions, pamphlets, or other written materials from your doctor or pharmacy?: 1 - Never  Interpreter Needed?: No  Information entered by :: Lanier Ensign, LPN   Activities of Daily Living    05/30/2023   11:36 AM 04/05/2023    8:16 AM  In your present state of health, do you have any difficulty performing the following activities:  Hearing? 0   Vision? 0   Difficulty concentrating or making decisions? 0   Walking or climbing stairs? 0   Dressing or bathing? 0   Doing errands, shopping? 0 0  Preparing Food and eating ? N   Using the Toilet? N   In the past six months, have you accidently leaked urine? N   Do you have problems with loss of bowel control? N   Managing your Medications? N   Managing your Finances? N   Housekeeping or managing your Housekeeping? N     Patient Care Team: Willow Ora, MD as PCP - General (Family Medicine) Irena Cords, Enzo Montgomery, MD as Consulting Physician (Allergy and Immunology) Janalyn Harder, MD (Inactive) as Consulting Physician (Dermatology) Maris Berger, MD as Consulting Physician (Ophthalmology)  Indicate any recent Medical Services you may have received from other than [facility] providers in the past year (date may be approximate).     Assessment:   This is a routine wellness examination for [facility].  Hearing/Vision screen Hearing Screening - Comments:: Pt denies any  hearing issues  Vision Screening - Comments:: Pt follows up with Dr Charlotte Sanes for annual eye exams   Dietary issues and exercise activities discussed:     Goals Addressed             This Visit's Progress    Patient Stated       Lose a little weight        Depression Screen    05/30/2023   11:39 AM  03/27/2023    1:03 PM 09/17/2022   10:56 AM 03/22/2022    3:05 PM 09/07/2021    9:49 AM  04/08/2020   10:52 AM 04/08/2020   10:46 AM  PHQ 2/9 Scores  PHQ - 2 Score 0 0 0 0 0 0 0    Fall Risk    05/30/2023   11:41 AM 03/27/2023    1:03 PM 09/17/2022   10:55 AM 03/22/2022    3:06 PM 03/12/2022   12:54 PM  Fall Risk   Falls in the past year? 0 0 0 0 0  Number falls in past yr: 0 0 0 0 0  Injury with Fall? 0 0 0 0 0  Risk for fall due to : Impaired vision No Fall Risks No Fall Risks Impaired vision No Fall Risks  Follow up Falls prevention discussed Falls evaluation completed Falls evaluation completed Falls prevention discussed Falls evaluation completed    MEDICARE RISK AT HOME:  Medicare Risk at Home - 05/30/23 1141     Any stairs in or around the home? Yes    If so, are there any without handrails? No    Home free of loose throw rugs in walkways, pet beds, electrical cords, etc? Yes    Adequate lighting in your home to reduce risk of falls? Yes    Life alert? No    Use of a cane, walker or w/c? No    Grab bars in the bathroom? No    Shower chair or bench in shower? Yes    Elevated toilet seat or a handicapped toilet? No             TIMED UP AND GO:  Was the test performed?  No    Cognitive Function:        03/22/2022    3:08 PM  6CIT Screen  What Year? 0 points  What month? 0 points  What time? 0 points  Count back from 20 0 points  Months in reverse 0 points  Repeat phrase 0 points  Total Score 0 points    Immunizations Immunization History  Administered Date(s) Administered   PFIZER(Purple Top)SARS-COV-2 Vaccination 12/10/2019, 01/04/2020, 03/17/2020, 09/28/2020, 02/05/2022   Pneumococcal Conjugate-13 02/16/2015   Pneumococcal Polysaccharide-23 10/11/2008, 09/20/2016, 09/13/2017, 09/12/2018, 09/01/2019, 03/17/2020   Zoster, Live 02/22/2015    TDAP status: Due, Education has been provided regarding the importance of this vaccine. Advised may receive this vaccine at local pharmacy or Health Dept. Aware to provide a copy of the vaccination  record if obtained from local pharmacy or Health Dept. Verbalized acceptance and understanding.  Flu Vaccine status: Declined, Education has been provided regarding the importance of this vaccine but patient still declined. Advised may receive this vaccine at local pharmacy or Health Dept. Aware to provide a copy of the vaccination record if obtained from local pharmacy or Health Dept. Verbalized acceptance and understanding.  Pneumococcal vaccine status: Up to date  Covid-19 vaccine status: Completed vaccines  Qualifies for Shingles Vaccine? No    Screening Tests Health Maintenance  Topic Date Due   DTaP/Tdap/Td (1 - Tdap) Never done   DEXA SCAN  04/14/2021   COVID-19 Vaccine (6 - 2023-24 season) 07/06/2022   MAMMOGRAM  01/08/2024   Medicare Annual Wellness (AWV)  05/29/2024   Pneumonia Vaccine 65+ Years old  Completed   HPV VACCINES  Aged Out   INFLUENZA VACCINE  Discontinued   Colonoscopy  Discontinued   Hepatitis C Screening  Discontinued   Zoster Vaccines-  Shingrix  Discontinued    Health Maintenance  Health Maintenance Due  Topic Date Due   DTaP/Tdap/Td (1 - Tdap) Never done   DEXA SCAN  04/14/2021   COVID-19 Vaccine (6 - 2023-24 season) 07/06/2022    Colorectal cancer screening: No longer required.   Mammogram status: Completed 01/08/23. Repeat every year  Pt stated bone density to be scheduled at a later date    Additional Screening:  Hepatitis C Screening:  Completed 10/10/20  Vision Screening: Recommended annual ophthalmology exams for early detection of glaucoma and other disorders of the eye. Is the patient up to date with their annual eye exam?  Yes  Who is the provider or what is the name of the office in which the patient attends annual eye exams? Dr Charlotte Sanes  If pt is not established with a provider, would they like to be referred to a provider to establish care? No .   Dental Screening: Recommended annual dental exams for proper oral  hygiene   Community Resource Referral / Chronic Care Management: CRR required this visit?  No   CCM required this visit?  No     Plan:     I have personally reviewed and noted the following in the patient's chart:   Medical and social history Use of alcohol, tobacco or illicit drugs  Current medications and supplements including opioid prescriptions. Patient is not currently taking opioid prescriptions. Functional ability and status Nutritional status Physical activity Advanced directives List of other physicians Hospitalizations, surgeries, and ER visits in previous 12 months Vitals Screenings to include cognitive, depression, and falls Referrals and appointments  In addition, I have reviewed and discussed with patient certain preventive protocols, quality metrics, and best practice recommendations. A written personalized care plan for preventive services as well as general preventive health recommendations were provided to patient.     Marzella Schlein, LPN   11/23/1476   After Visit Summary: (MyChart) Due to this being a telephonic visit, the after visit summary with patients personalized plan was offered to patient via MyChart   Nurse Notes: none

## 2023-06-04 DIAGNOSIS — M25562 Pain in left knee: Secondary | ICD-10-CM | POA: Diagnosis not present

## 2023-06-04 DIAGNOSIS — M9903 Segmental and somatic dysfunction of lumbar region: Secondary | ICD-10-CM | POA: Diagnosis not present

## 2023-06-04 DIAGNOSIS — M9906 Segmental and somatic dysfunction of lower extremity: Secondary | ICD-10-CM | POA: Diagnosis not present

## 2023-06-04 DIAGNOSIS — M9905 Segmental and somatic dysfunction of pelvic region: Secondary | ICD-10-CM | POA: Diagnosis not present

## 2023-06-04 DIAGNOSIS — M9904 Segmental and somatic dysfunction of sacral region: Secondary | ICD-10-CM | POA: Diagnosis not present

## 2023-06-04 DIAGNOSIS — M9902 Segmental and somatic dysfunction of thoracic region: Secondary | ICD-10-CM | POA: Diagnosis not present

## 2023-06-25 DIAGNOSIS — M9906 Segmental and somatic dysfunction of lower extremity: Secondary | ICD-10-CM | POA: Diagnosis not present

## 2023-06-25 DIAGNOSIS — M25512 Pain in left shoulder: Secondary | ICD-10-CM | POA: Diagnosis not present

## 2023-06-25 DIAGNOSIS — M9908 Segmental and somatic dysfunction of rib cage: Secondary | ICD-10-CM | POA: Diagnosis not present

## 2023-06-25 DIAGNOSIS — M25562 Pain in left knee: Secondary | ICD-10-CM | POA: Diagnosis not present

## 2023-06-25 DIAGNOSIS — R29898 Other symptoms and signs involving the musculoskeletal system: Secondary | ICD-10-CM | POA: Diagnosis not present

## 2023-06-25 DIAGNOSIS — M9907 Segmental and somatic dysfunction of upper extremity: Secondary | ICD-10-CM | POA: Diagnosis not present

## 2023-07-09 DIAGNOSIS — D485 Neoplasm of uncertain behavior of skin: Secondary | ICD-10-CM | POA: Diagnosis not present

## 2023-07-10 ENCOUNTER — Telehealth: Payer: Self-pay | Admitting: Family Medicine

## 2023-07-10 ENCOUNTER — Other Ambulatory Visit: Payer: Self-pay

## 2023-07-10 DIAGNOSIS — Z13820 Encounter for screening for osteoporosis: Secondary | ICD-10-CM

## 2023-07-10 NOTE — Telephone Encounter (Signed)
Patient returned call. Patient is scheduled for DEXA scan at [facility]

## 2023-07-10 NOTE — Telephone Encounter (Signed)
Patient called stating she was able to schedule her DEXA scan with one of the companies provided to her by PCP but they need an order. Before I could get the name of where she was scheduled the phone call was disconnected abruptly. I tried calling pt back but received no answer. LVM informing patient to call back.

## 2023-07-16 IMAGING — DX DG CHEST 2V
2 series · 2 of 2 positions shown · non-contrast
Comparison: None.

CLINICAL DATA: Neoplasm of left kidney.

EXAM:
CHEST - 2 VIEW

[chest pa]
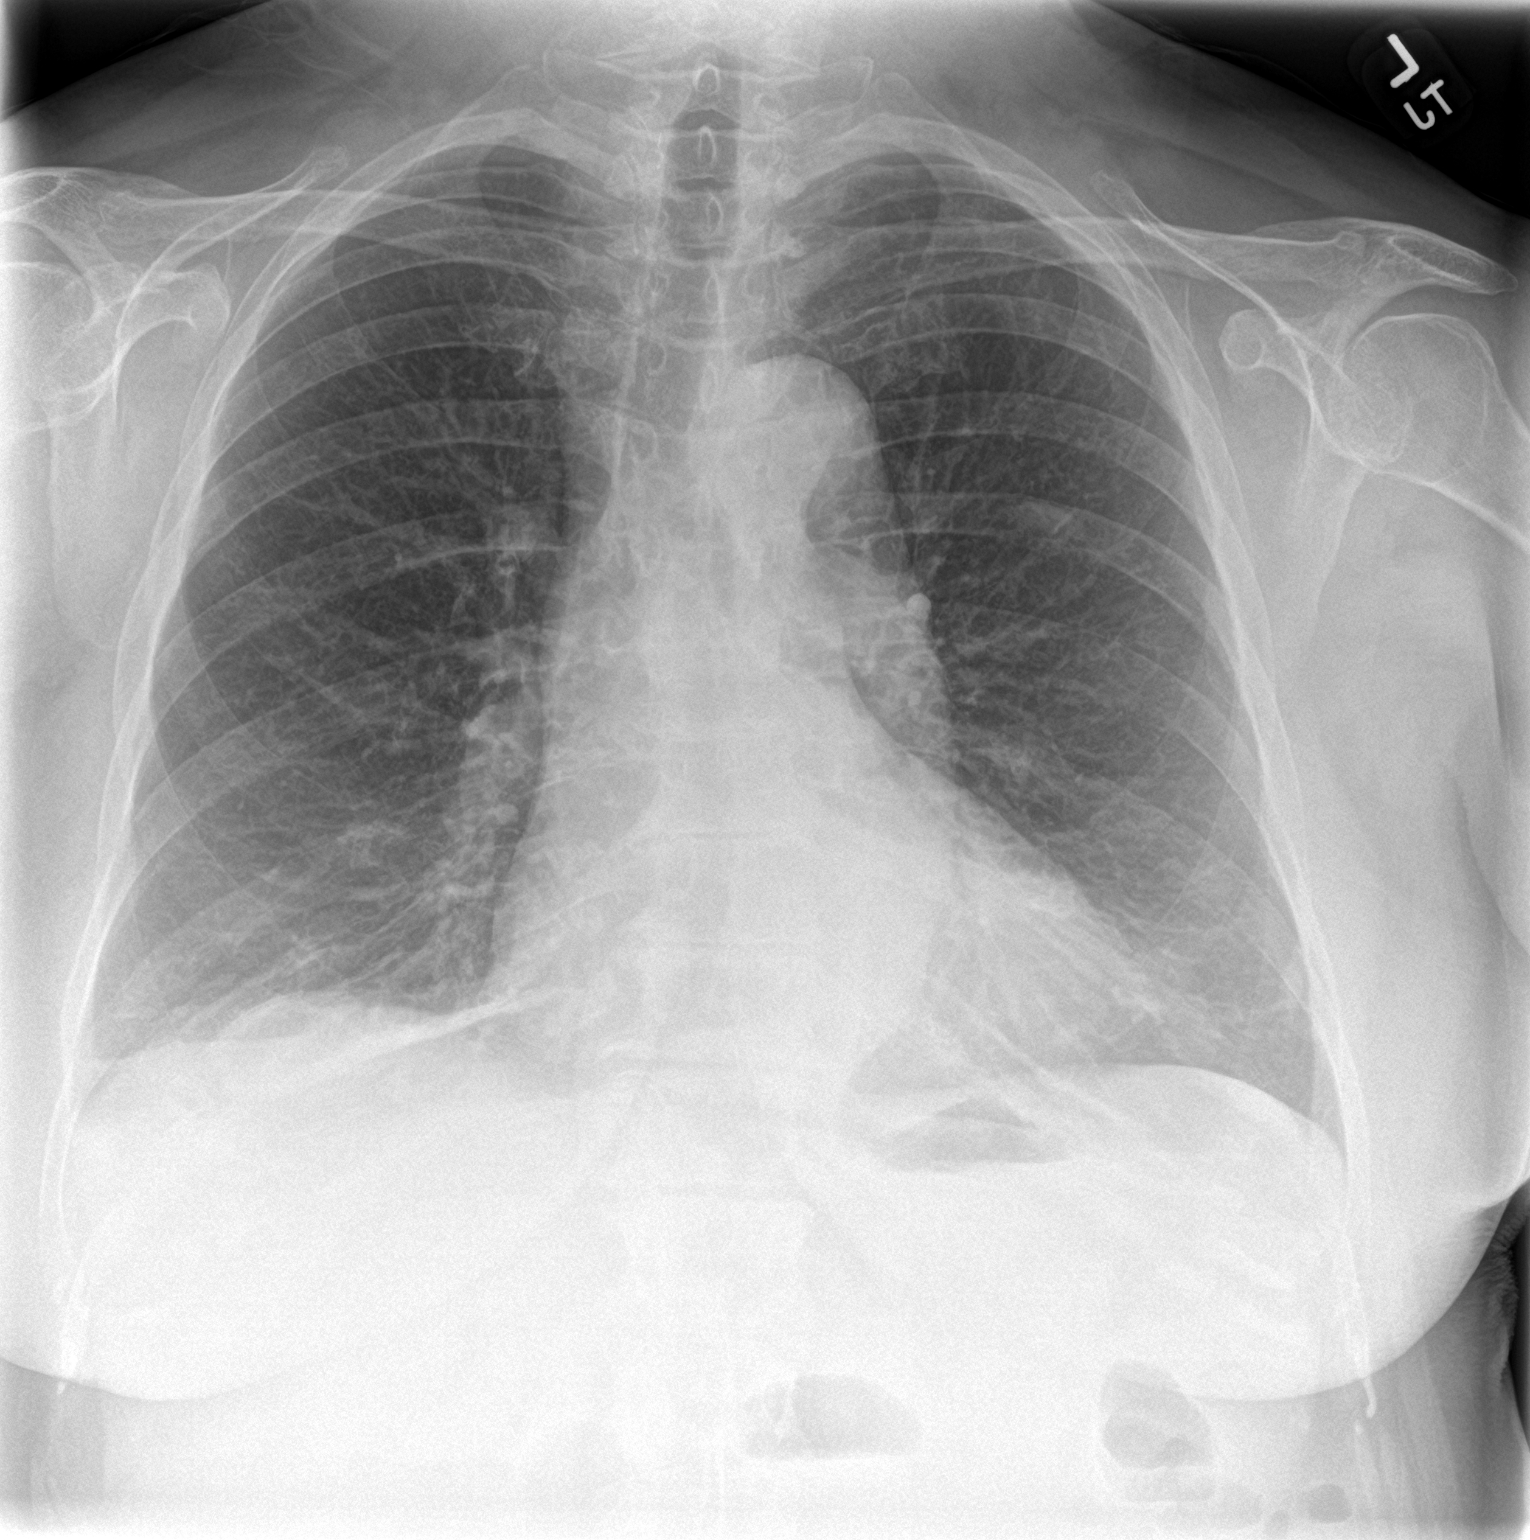

[chest lat]
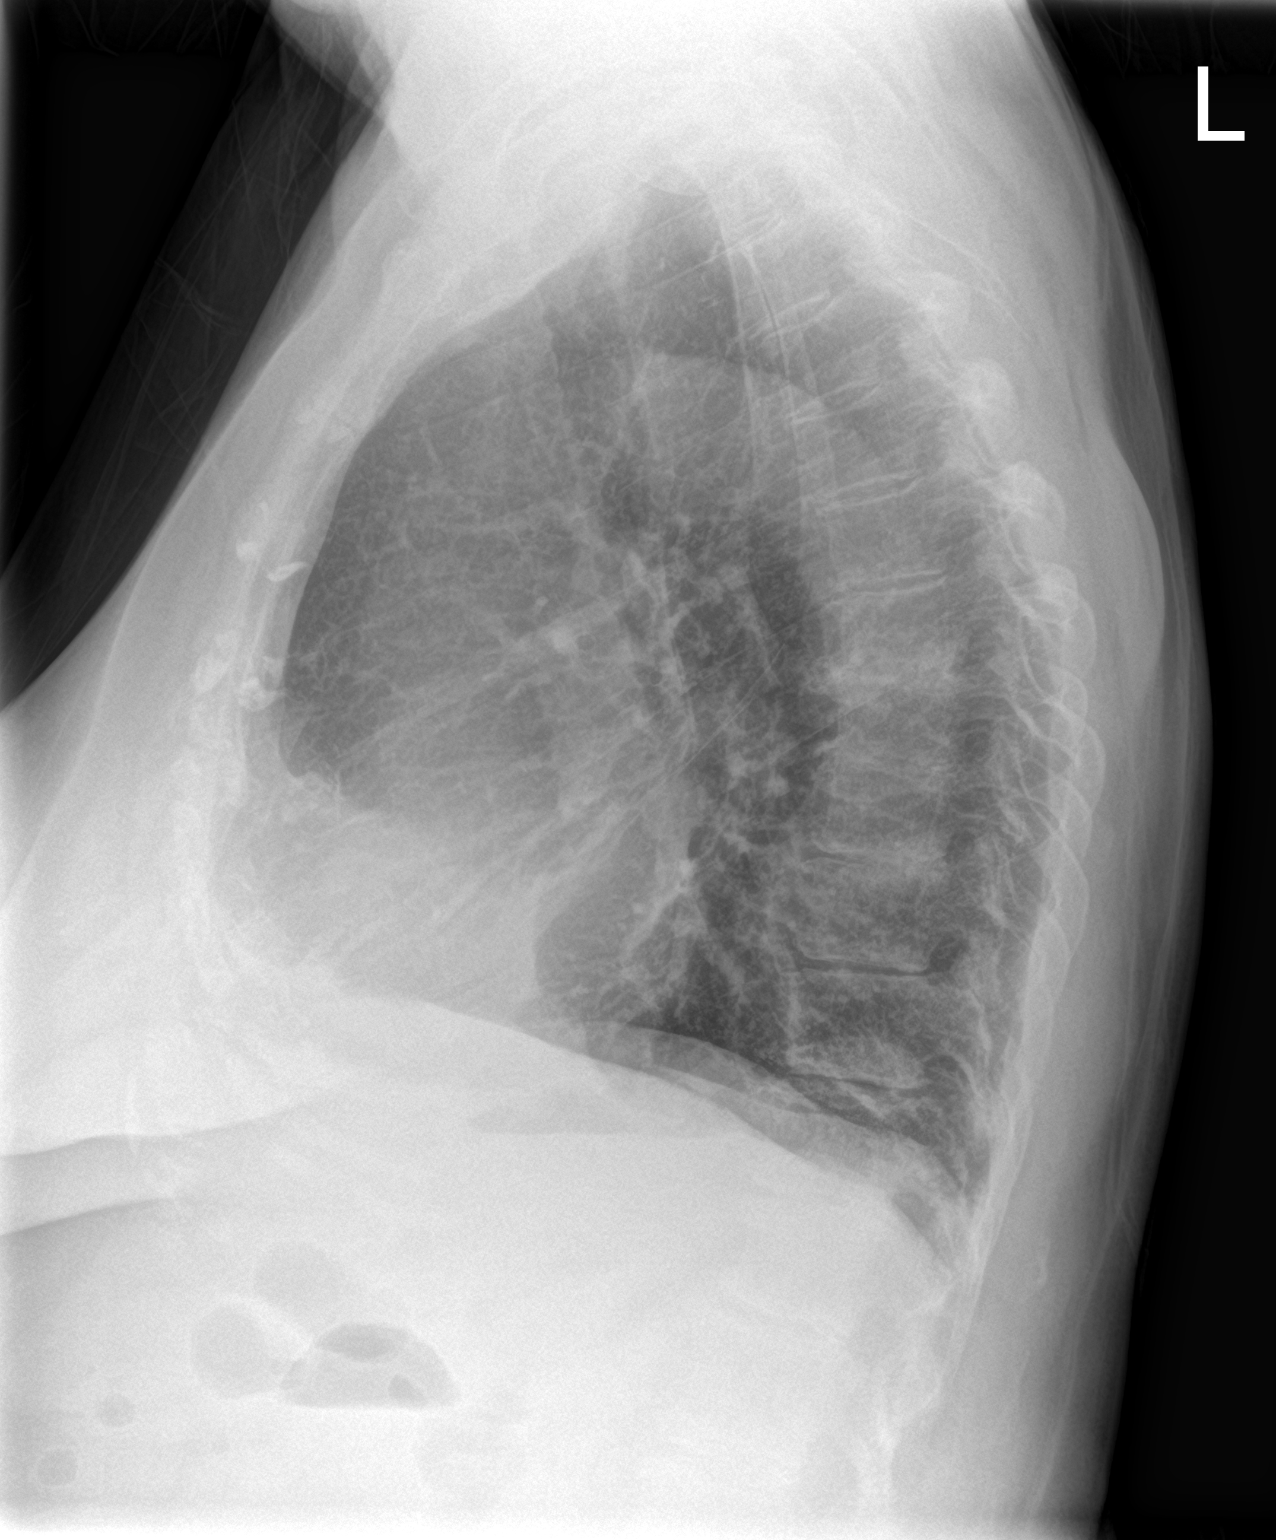

[2 of 2 positions shown; findings below may reference images not displayed]

FINDINGS: Heart and mediastinum are within normal limits. Upper lungs are
clear. Streaky densities in the lower lungs bilaterally.
Questionable nodular density in the lateral left lower chest. No
large pleural effusions. Multilevel degenerative disc and endplate
changes in mid and lower thoracic spine. Trachea is midline.
IMPRESSION: 1. Streaky densities in the lower chest are nonspecific and could
represent chronic changes. Question a nodular density in the lateral
left lower chest that could be related to overlying structures and
could be related to a nipple shadow. Consider follow-up chest
radiograph exam with bilateral nipple markers.
2. No clear evidence for neoplastic disease in the chest.

## 2023-07-17 IMAGING — MR MR ABDOMEN WO/W CM
13 of 20 series · 28 of 48 positions shown · IV contrast (10 ML GADAVIST)
Comparison: Ultrasound exam 08/10/2021.
COMPARISON: Ultrasound exam 08/10/2021.

Addendum:
CLINICAL DATA: Left renal mass.

EXAM:
MRI ABDOMEN WITHOUT AND WITH CONTRAST
TECHNIQUE: Multiplanar multisequence MR imaging of the abdomen was performed
both before and after the administration of intravenous contrast.
CONTRAST:  7mL GADAVIST GADOBUTROL 1 MMOL/ML IV SOLN

[Series 3: T2 · coronal · 5.0mm · 0.74mm/px · 2 of 21 slices shown (1 of 3)]
[im 1/21]
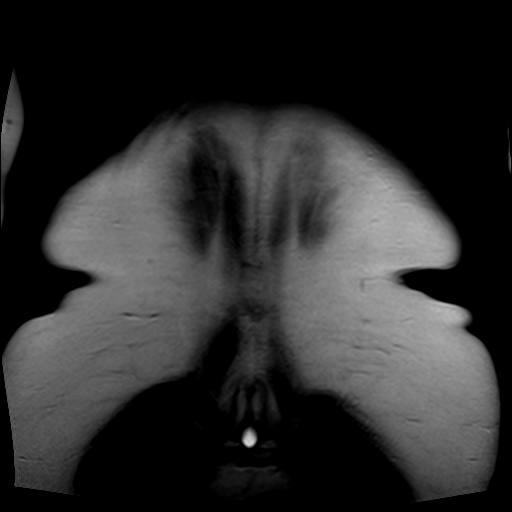
[im 21/21]
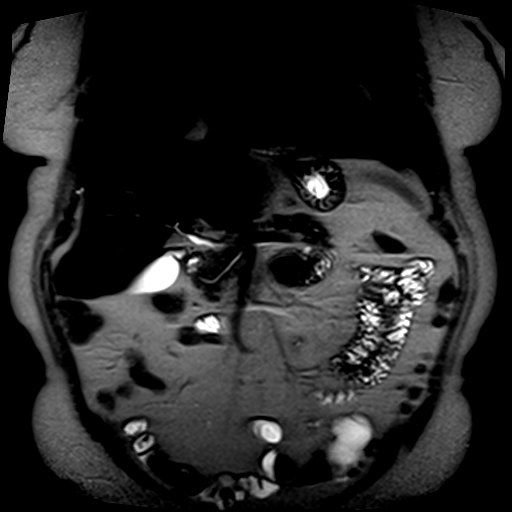

[Series 4: cor haste liver · coronal · 5.0mm · 0.68mm/px · 2 of 34 slices shown]
[im 1/34]
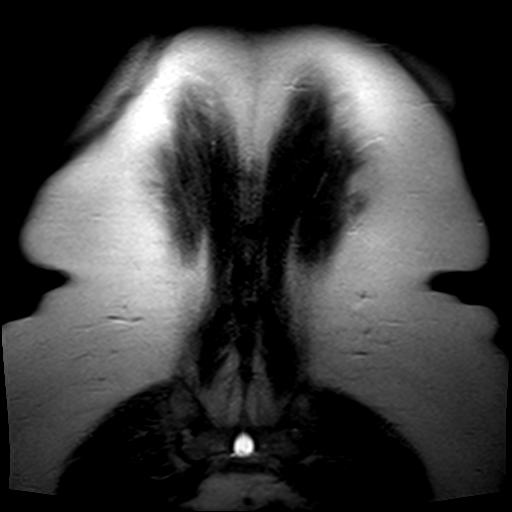
[im 34/34]
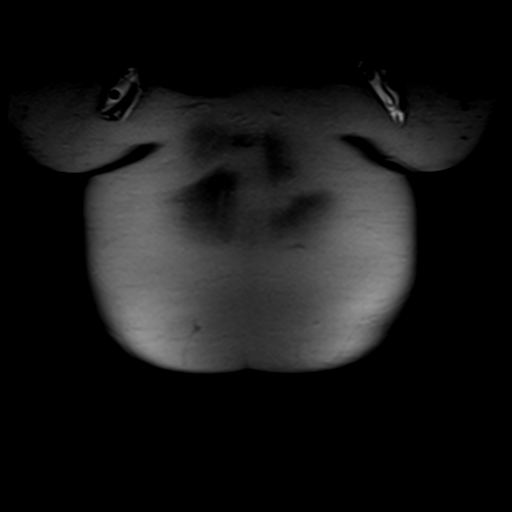

[Series 5: T2 · axial · 5.0mm · 0.68mm/px · 1 of 29 slices shown (2 of 3)]
[im 1/29]
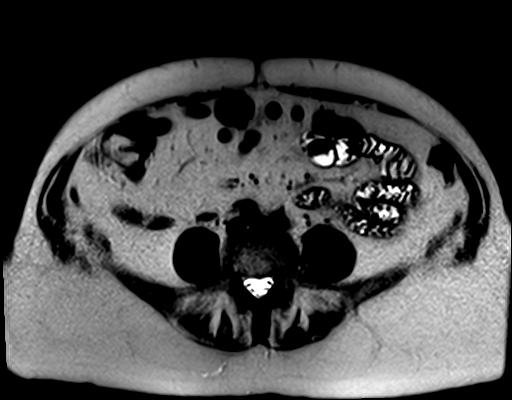

[Series 6: bSSFP · axial · 4.0mm · 0.68mm/px · z∈[-65,+106]mm · 2 of 44 slices shown]
[im 1/44]
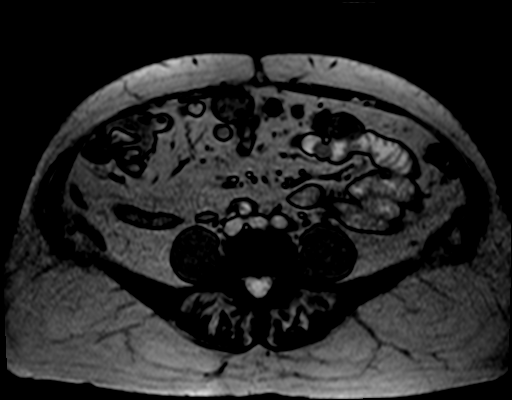
[im 44/44]
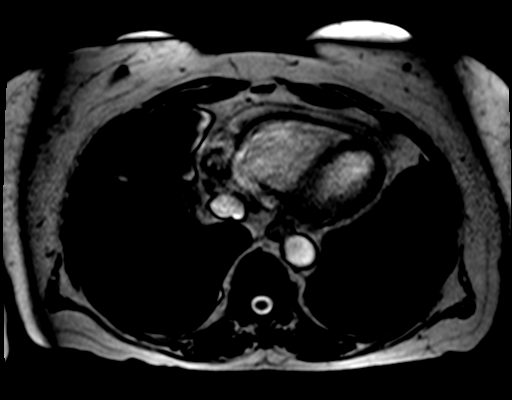

[Series 7: ep2d_[id number]_500_800_p2_trig · axial · 5.0mm · 1.82mm/px · z∈[-70,+102]mm · 4 of 90 slices shown]
[im 1/90]
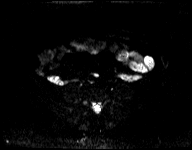
[im 30/90]
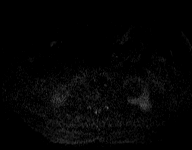
[im 60/90]
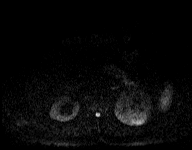
[im 90/90]
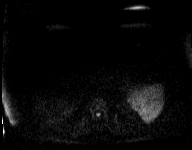

[Series 8: [id number]_[id number]_trig_adc · axial · 5.0mm · 1.82mm/px · 1 of 30 slices shown]
[im 1/30]
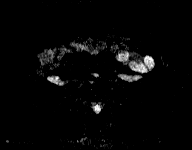

[Series 9: T2 · axial · 5.0mm · 1.37mm/px · 1 of 30 slices shown (3 of 3)]
[im 1/30]
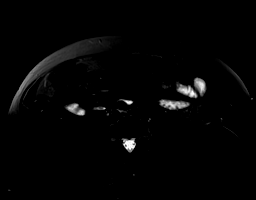

[Series 10: T1 dynamic · coronal · non-contrast · 2.5mm · 0.74mm/px · 2 of 52 slices shown (1 of 2)]
[im 1/52]
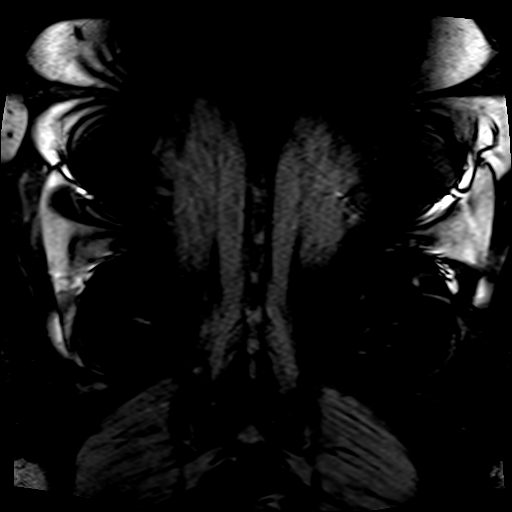
[im 52/52]
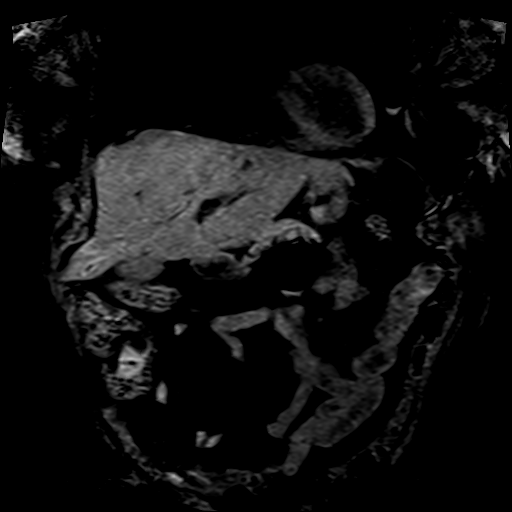

[Series 11: T1 · axial · 5.0mm · 0.68mm/px · z∈[-57,+98]mm · 2 of 54 slices shown]
[im 1/54]
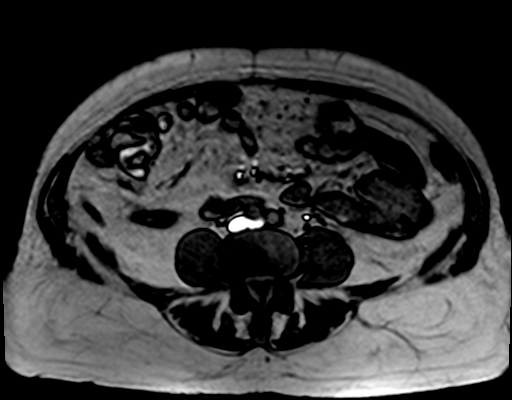
[im 54/54]
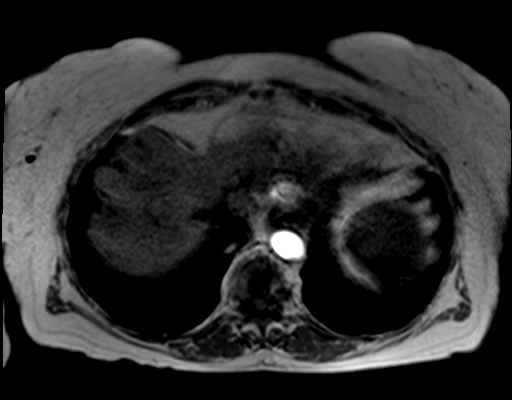

[Series 12: T1 dynamic · axial · non-contrast · 2.3mm · 1.37mm/px · z∈[-72,+108]mm · 3 of 80 slices shown (2 of 2)]
[im 1/80]
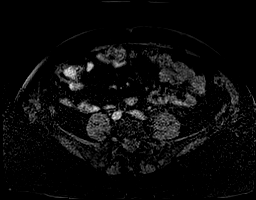
[im 40/80]
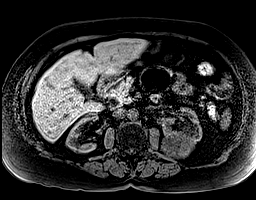
[im 80/80]
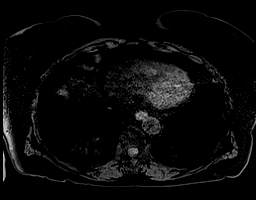

[Series 13: post 25 sec · axial · 2.3mm · 1.37mm/px · z∈[-72,+108]mm · 3 of 80 slices shown]
[im 1/80]
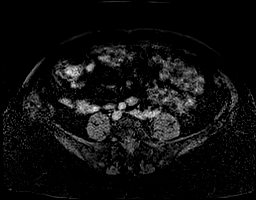
[im 40/80]
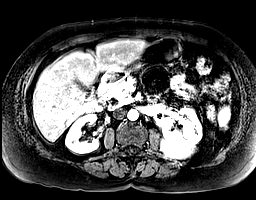
[im 80/80]
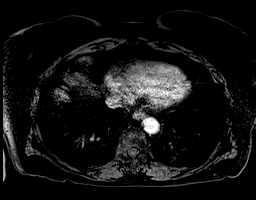

[Series 14: post 25 sec_sub · axial · 2.3mm · 1.37mm/px · z∈[-72,+108]mm · 3 of 80 slices shown]
[im 1/80]
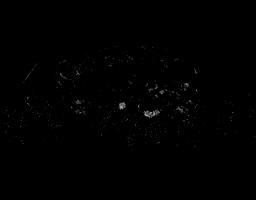
[im 40/80]
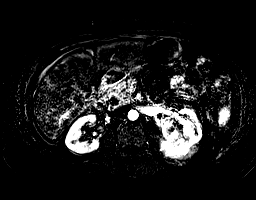
[im 80/80]
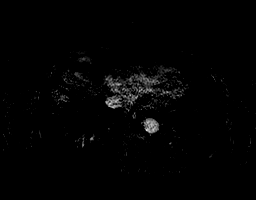

[Series 15: post 45 sec · axial · 2.3mm · 1.37mm/px · z∈[-72,+17]mm · 2 of 80 slices shown]
[im 1/80]
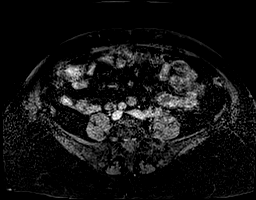
[im 40/80]
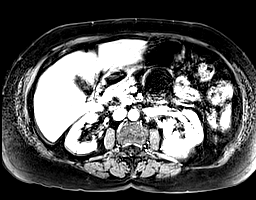

[28 of 48 positions shown; findings below may reference images not displayed]

FINDINGS: Lower chest: Unremarkable.

Hepatobiliary: No suspicious focal abnormality within the liver
parenchyma. There is no evidence for gallstones, gallbladder wall
thickening, or pericholecystic fluid. No intrahepatic or
extrahepatic biliary dilation.

Pancreas: Ductal anatomy is consistent with pancreas divisum. No
focal or suspicious mass lesion. No main duct dilatation.

Spleen:  No splenomegaly. No focal mass lesion.

Adrenals/Urinary Tract:  No adrenal nodule or mass.

Multiple tiny T2 hyperintensities in the right kidney are too small
to characterize but likely benign. 1.5 cm T1 hypointense, T2
hyperintense lesion identified anteriorly in the lower interpolar
right kidney (axial T2 image [DATE]). No definite enhancement after IV
contrast administration although the rim of this lesion appears
minimally irregular, potentially related to motion.

7.1 x 7.2 x 6.1 cm heterogeneous exophytic mass noted upper pole
right kidney. Lesion shows heterogeneous enhancement after IV
contrast administration. MR imaging features compatible with renal
cell carcinoma, likely clear tight. Lesion extends posteriorly into
the perinephric fat and there is evidence of extension into the
posterior aspect of the central sinus. No evidence for filling
defect in the left renal vein or left renal pelvis.

2.3 cm exophytic lesion noted posteriorly in the interpolar left
kidney. While characterization of this lesion is somewhat limited by
motion degradation, T2 imaging shows an imperceptible wall with
homogeneous high signal intensity. Pre contrast T1 imaging shows
diffuse increased signal intensity. No definite enhancement after IV
contrast administration with assessment somewhat limited by the
motion degradation.

Tiny 9 mm subcapsular lesion noted extreme lower pole left kidney
demonstrating high signal intensity on precontrast T1 imaging (axial
image 71 of series 12 and precontrast coronal T1 image 34 of series
10). This lesion is not perceptible on later post-contrast or
subtraction imaging due to motion degradation. No high signal
intensity in this region on T2 imaging. Features are felt to be most
likely related to a hemorrhagic or proteinaceous cyst.

Stomach/Bowel: Stomach is unremarkable. No gastric wall thickening.
No evidence of outlet obstruction. Duodenum is normally positioned
as is the ligament of Treitz. Duodenum diverticula evident. No small
bowel or colonic dilatation within the visualized abdomen.
Diverticular disease noted in the left colon.

Vascular/Lymphatic: No abdominal aortic aneurysm. No abdominal
lymphadenopathy.

Other:  No intraperitoneal free fluid.

Musculoskeletal: No focal suspicious marrow enhancement within the
visualized bony anatomy.
IMPRESSION: 1. 7.1 x 7.2 x 6.1 cm heterogeneous exophytic mass upper pole right
kidney shows heterogeneous enhancement after IV contrast
administration and is most consistent with renal cell carcinoma,
likely clear cell.
2. 2.3 cm exophytic lesion posteriorly in the interpolar left kidney
shows no definite enhancement after IV contrast administration and
is most likely a hemorrhagic or proteinaceous cyst (Bosniak II).
3. 9 mm subcapsular lesion extreme lower pole left kidney with high
signal intensity on precontrast T1 imaging and not perceptible on
postcontrast imaging secondary to motion degradation. Imaging
features are felt to be most likely related to a hemorrhagic or
proteinaceous cyst (Bosniak II) but attention on follow-up
recommended.
4. 1.5 cm cyst lower pole right kidney shows no definite enhancement
after IV contrast administration and while characterization of this
lesion is somewhat limited by motion degradation, is likely Bosniak
I cyst.
5. Pancreas divisum.

ADDENDUM:
Laterality error in both the body of the report and the impression
regarding the 7.2 cm heterogeneous exophytic renal mass. This mass
lesion is in the upper pole of the left kidney, not in the upper
pole of the right kidney as stated both in the body of the report
and the impression.

*** End of Addendum ***
FINDINGS: Lower chest: Unremarkable.

Hepatobiliary: No suspicious focal abnormality within the liver
parenchyma. There is no evidence for gallstones, gallbladder wall
thickening, or pericholecystic fluid. No intrahepatic or
extrahepatic biliary dilation.

Pancreas: Ductal anatomy is consistent with pancreas divisum. No
focal or suspicious mass lesion. No main duct dilatation.

Spleen:  No splenomegaly. No focal mass lesion.

Adrenals/Urinary Tract:  No adrenal nodule or mass.

Multiple tiny T2 hyperintensities in the right kidney are too small
to characterize but likely benign. 1.5 cm T1 hypointense, T2
hyperintense lesion identified anteriorly in the lower interpolar
right kidney (axial T2 image [DATE]). No definite enhancement after IV
contrast administration although the rim of this lesion appears
minimally irregular, potentially related to motion.

7.1 x 7.2 x 6.1 cm heterogeneous exophytic mass noted upper pole
right kidney. Lesion shows heterogeneous enhancement after IV
contrast administration. MR imaging features compatible with renal
cell carcinoma, likely clear tight. Lesion extends posteriorly into
the perinephric fat and there is evidence of extension into the
posterior aspect of the central sinus. No evidence for filling
defect in the left renal vein or left renal pelvis.

2.3 cm exophytic lesion noted posteriorly in the interpolar left
kidney. While characterization of this lesion is somewhat limited by
motion degradation, T2 imaging shows an imperceptible wall with
homogeneous high signal intensity. Pre contrast T1 imaging shows
diffuse increased signal intensity. No definite enhancement after IV
contrast administration with assessment somewhat limited by the
motion degradation.

Tiny 9 mm subcapsular lesion noted extreme lower pole left kidney
demonstrating high signal intensity on precontrast T1 imaging (axial
image 71 of series 12 and precontrast coronal T1 image 34 of series
10). This lesion is not perceptible on later post-contrast or
subtraction imaging due to motion degradation. No high signal
intensity in this region on T2 imaging. Features are felt to be most
likely related to a hemorrhagic or proteinaceous cyst.

Stomach/Bowel: Stomach is unremarkable. No gastric wall thickening.
No evidence of outlet obstruction. Duodenum is normally positioned
as is the ligament of Treitz. Duodenum diverticula evident. No small
bowel or colonic dilatation within the visualized abdomen.
Diverticular disease noted in the left colon.

Vascular/Lymphatic: No abdominal aortic aneurysm. No abdominal
lymphadenopathy.

Other:  No intraperitoneal free fluid.

Musculoskeletal: No focal suspicious marrow enhancement within the
visualized bony anatomy.
IMPRESSION: 1. 7.1 x 7.2 x 6.1 cm heterogeneous exophytic mass upper pole right
kidney shows heterogeneous enhancement after IV contrast
administration and is most consistent with renal cell carcinoma,
likely clear cell.
2. 2.3 cm exophytic lesion posteriorly in the interpolar left kidney
shows no definite enhancement after IV contrast administration and
is most likely a hemorrhagic or proteinaceous cyst (Bosniak II).
3. 9 mm subcapsular lesion extreme lower pole left kidney with high
signal intensity on precontrast T1 imaging and not perceptible on
postcontrast imaging secondary to motion degradation. Imaging
features are felt to be most likely related to a hemorrhagic or
proteinaceous cyst (Bosniak II) but attention on follow-up
recommended.
4. 1.5 cm cyst lower pole right kidney shows no definite enhancement
after IV contrast administration and while characterization of this
lesion is somewhat limited by motion degradation, is likely Bosniak
I cyst.
5. Pancreas divisum.

## 2023-07-25 DIAGNOSIS — M9905 Segmental and somatic dysfunction of pelvic region: Secondary | ICD-10-CM | POA: Diagnosis not present

## 2023-07-25 DIAGNOSIS — M9907 Segmental and somatic dysfunction of upper extremity: Secondary | ICD-10-CM | POA: Diagnosis not present

## 2023-07-25 DIAGNOSIS — M25511 Pain in right shoulder: Secondary | ICD-10-CM | POA: Diagnosis not present

## 2023-07-25 DIAGNOSIS — M9902 Segmental and somatic dysfunction of thoracic region: Secondary | ICD-10-CM | POA: Diagnosis not present

## 2023-07-25 DIAGNOSIS — M9906 Segmental and somatic dysfunction of lower extremity: Secondary | ICD-10-CM | POA: Diagnosis not present

## 2023-07-25 DIAGNOSIS — M9904 Segmental and somatic dysfunction of sacral region: Secondary | ICD-10-CM | POA: Diagnosis not present

## 2023-07-25 DIAGNOSIS — M25562 Pain in left knee: Secondary | ICD-10-CM | POA: Diagnosis not present

## 2023-07-27 DIAGNOSIS — U071 COVID-19: Secondary | ICD-10-CM | POA: Diagnosis not present

## 2023-07-27 DIAGNOSIS — Z20822 Contact with and (suspected) exposure to covid-19: Secondary | ICD-10-CM | POA: Diagnosis not present

## 2023-07-27 DIAGNOSIS — R059 Cough, unspecified: Secondary | ICD-10-CM | POA: Diagnosis not present

## 2023-08-20 DIAGNOSIS — M9904 Segmental and somatic dysfunction of sacral region: Secondary | ICD-10-CM | POA: Diagnosis not present

## 2023-08-20 DIAGNOSIS — M9902 Segmental and somatic dysfunction of thoracic region: Secondary | ICD-10-CM | POA: Diagnosis not present

## 2023-08-20 DIAGNOSIS — M25511 Pain in right shoulder: Secondary | ICD-10-CM | POA: Diagnosis not present

## 2023-08-20 DIAGNOSIS — M9906 Segmental and somatic dysfunction of lower extremity: Secondary | ICD-10-CM | POA: Diagnosis not present

## 2023-08-20 DIAGNOSIS — M9905 Segmental and somatic dysfunction of pelvic region: Secondary | ICD-10-CM | POA: Diagnosis not present

## 2023-08-20 DIAGNOSIS — M9907 Segmental and somatic dysfunction of upper extremity: Secondary | ICD-10-CM | POA: Diagnosis not present

## 2023-08-20 DIAGNOSIS — M25562 Pain in left knee: Secondary | ICD-10-CM | POA: Diagnosis not present

## 2023-08-20 DIAGNOSIS — M9901 Segmental and somatic dysfunction of cervical region: Secondary | ICD-10-CM | POA: Diagnosis not present

## 2023-08-25 DIAGNOSIS — R69 Illness, unspecified: Secondary | ICD-10-CM | POA: Diagnosis not present

## 2023-09-10 DIAGNOSIS — E2839 Other primary ovarian failure: Secondary | ICD-10-CM | POA: Diagnosis not present

## 2023-09-10 DIAGNOSIS — M8588 Other specified disorders of bone density and structure, other site: Secondary | ICD-10-CM | POA: Diagnosis not present

## 2023-09-10 LAB — HM DEXA SCAN

## 2023-09-11 ENCOUNTER — Encounter: Payer: Self-pay | Admitting: Family Medicine

## 2023-09-18 DIAGNOSIS — M9902 Segmental and somatic dysfunction of thoracic region: Secondary | ICD-10-CM | POA: Diagnosis not present

## 2023-09-18 DIAGNOSIS — M9905 Segmental and somatic dysfunction of pelvic region: Secondary | ICD-10-CM | POA: Diagnosis not present

## 2023-09-18 DIAGNOSIS — M170 Bilateral primary osteoarthritis of knee: Secondary | ICD-10-CM | POA: Diagnosis not present

## 2023-09-18 DIAGNOSIS — M9906 Segmental and somatic dysfunction of lower extremity: Secondary | ICD-10-CM | POA: Diagnosis not present

## 2023-09-18 DIAGNOSIS — M9903 Segmental and somatic dysfunction of lumbar region: Secondary | ICD-10-CM | POA: Diagnosis not present

## 2023-09-18 DIAGNOSIS — M25561 Pain in right knee: Secondary | ICD-10-CM | POA: Diagnosis not present

## 2023-09-18 DIAGNOSIS — M9904 Segmental and somatic dysfunction of sacral region: Secondary | ICD-10-CM | POA: Diagnosis not present

## 2023-09-18 DIAGNOSIS — M25562 Pain in left knee: Secondary | ICD-10-CM | POA: Diagnosis not present

## 2023-09-27 ENCOUNTER — Ambulatory Visit (INDEPENDENT_AMBULATORY_CARE_PROVIDER_SITE_OTHER): Payer: Medicare HMO | Admitting: Family Medicine

## 2023-09-27 ENCOUNTER — Other Ambulatory Visit: Payer: Self-pay | Admitting: Family Medicine

## 2023-09-27 ENCOUNTER — Encounter: Payer: Self-pay | Admitting: Family Medicine

## 2023-09-27 VITALS — BP 142/68 | HR 68 | Temp 97.8°F | Ht 60.0 in | Wt 148.4 lb

## 2023-09-27 DIAGNOSIS — R350 Frequency of micturition: Secondary | ICD-10-CM

## 2023-09-27 DIAGNOSIS — Z9189 Other specified personal risk factors, not elsewhere classified: Secondary | ICD-10-CM | POA: Insufficient documentation

## 2023-09-27 DIAGNOSIS — M8580 Other specified disorders of bone density and structure, unspecified site: Secondary | ICD-10-CM

## 2023-09-27 DIAGNOSIS — I10 Essential (primary) hypertension: Secondary | ICD-10-CM

## 2023-09-27 DIAGNOSIS — E782 Mixed hyperlipidemia: Secondary | ICD-10-CM | POA: Diagnosis not present

## 2023-09-27 DIAGNOSIS — N1831 Chronic kidney disease, stage 3a: Secondary | ICD-10-CM

## 2023-09-27 LAB — URINALYSIS, ROUTINE W REFLEX MICROSCOPIC
Bilirubin Urine: NEGATIVE
Ketones, ur: NEGATIVE
Nitrite: NEGATIVE
Specific Gravity, Urine: <=1.005 — AB (ref 1.000–1.030)
Total Protein, Urine: NEGATIVE
Urine Glucose: NEGATIVE
Urobilinogen, UA: 0.2 (ref 0.0–1.0)
pH: 6 (ref 5.0–8.0)

## 2023-09-27 LAB — LIPID PANEL
Cholesterol: 224 mg/dL — ABNORMAL HIGH (ref 0–200)
HDL: 59.8 mg/dL (ref >39.00)
LDL Cholesterol: 129 mg/dL — ABNORMAL HIGH (ref 0–99)
NonHDL: 163.84
Total CHOL/HDL Ratio: 4
Triglycerides: 176 mg/dL — ABNORMAL HIGH (ref 0.0–149.0)
VLDL: 35.2 mg/dL (ref 0.0–40.0)

## 2023-09-27 LAB — CBC WITH DIFFERENTIAL/PLATELET
Basophils Absolute: 0.1 10*3/uL (ref 0.0–0.1)
Basophils Relative: 1.4 % (ref 0.0–3.0)
Eosinophils Absolute: 0.2 10*3/uL (ref 0.0–0.7)
Eosinophils Relative: 3 % (ref 0.0–5.0)
HCT: 42.6 % (ref 36.0–46.0)
Hemoglobin: 13.8 g/dL (ref 12.0–15.0)
Lymphocytes Relative: 22.2 % (ref 12.0–46.0)
Lymphs Abs: 1.8 10*3/uL (ref 0.7–4.0)
MCHC: 32.5 g/dL (ref 30.0–36.0)
MCV: 93.8 fL (ref 78.0–100.0)
Monocytes Absolute: 0.5 10*3/uL (ref 0.1–1.0)
Monocytes Relative: 6.9 % (ref 3.0–12.0)
Neutro Abs: 5.3 10*3/uL (ref 1.4–7.7)
Neutrophils Relative %: 66.5 % (ref 43.0–77.0)
Platelets: 269 10*3/uL (ref 150.0–400.0)
RBC: 4.54 Mil/uL (ref 3.87–5.11)
RDW: 13.4 % (ref 11.5–15.5)
WBC: 8 10*3/uL (ref 4.0–10.5)

## 2023-09-27 LAB — COMPREHENSIVE METABOLIC PANEL
ALT: 13 U/L (ref 0–35)
AST: 16 U/L (ref 0–37)
Albumin: 4.7 g/dL (ref 3.5–5.2)
Alkaline Phosphatase: 94 U/L (ref 39–117)
BUN: 23 mg/dL (ref 6–23)
CO2: 26 meq/L (ref 19–32)
Calcium: 9.5 mg/dL (ref 8.4–10.5)
Chloride: 102 meq/L (ref 96–112)
Creatinine, Ser: 1.09 mg/dL (ref 0.40–1.20)
GFR: 47.98 mL/min — ABNORMAL LOW (ref >60.00)
Glucose, Bld: 100 mg/dL — ABNORMAL HIGH (ref 70–99)
Potassium: 5 meq/L (ref 3.5–5.1)
Sodium: 137 meq/L (ref 135–145)
Total Bilirubin: 0.6 mg/dL (ref 0.2–1.2)
Total Protein: 7 g/dL (ref 6.0–8.3)

## 2023-09-27 MED ORDER — CEPHALEXIN 500 MG PO CAPS: 500.0000 mg | ORAL_CAPSULE | Freq: Two times a day (BID) | ORAL | 0 refills | Status: DC

## 2023-09-27 MED ORDER — AMLODIPINE BESYLATE 2.5 MG PO TABS: 2.5000 mg | ORAL_TABLET | Freq: Every day | ORAL | 3 refills | Status: DC

## 2023-09-27 MED ORDER — ROSUVASTATIN CALCIUM 5 MG PO TABS: 5.0000 mg | ORAL_TABLET | Freq: Every evening | ORAL | 3 refills | Status: DC

## 2023-09-27 NOTE — Progress Notes (Signed)
See mychart note Dear Donna Andersen, Your urine looks like it could be infected, so go ahead and start the antibiotics. We will see what the culture shows next week.  Your cholesterol is fair; we could consider increasing the dose of crestor to 10mg .  Other lab results are all stable.  Have a great holiday and stay well. Sincerely, Dr. Mardelle Matte

## 2023-09-27 NOTE — Patient Instructions (Signed)
Please return in 6 months for your annual complete physical; please come fasting.    I will release your lab results to you on your MyChart account with further instructions. You may see the results before I do, but when I review them I will send you a message with my report or have my assistant call you if things need to be discussed. Please reply to my message with any questions. Thank you!   If you have any questions or concerns, please don't hesitate to send me a message via MyChart or call the office at [phone]. Thank you for visiting with [place] today! It's our pleasure caring for you.   VISIT SUMMARY:  During your follow-up visit, we discussed several health concerns including your blood pressure, urinary frequency, cholesterol management, and bone health. We also reviewed your general health maintenance and routine lab work.  YOUR PLAN:  -HYPERTENSION: Hypertension means high blood pressure. Your home readings are averaging 142-145/60-70 mmHg. We will add amlodipine 2.5 mg once daily to your current olmesartan 40 mg to better control your blood pressure. Please continue to monitor your blood pressure at home and report your readings.  -URINARY FREQUENCY: Increased urinary frequency can sometimes indicate a urinary tract infection (UTI). We will send a urine sample for analysis and, if needed, start antibiotics. You will be notified of the results via MyChart.  -HYPERLIPIDEMIA: Hyperlipidemia means high cholesterol levels. You are managing this with rosuvastatin and should continue your current regimen. We will check your cholesterol levels with blood work today.  -OSTEOPENIA: Osteopenia means lower than normal bone density, which can increase the risk of fractures. Your recent bone density scan showed a T-score of -2.3. We discussed Prolia as a treatment option and will consider it further at your next visit. We will also submit for insurance approval for Prolia.  -GENERAL HEALTH  MAINTENANCE: We reviewed your routine health maintenance, including ordering blood work and encouraging you to continue your physical activities like pickleball. Your active lifestyle is beneficial for your overall health.  INSTRUCTIONS:  Please follow up with your blood pressure readings and review your urine test results via MyChart. We will discuss the Prolia treatment decision at your next visit.

## 2023-09-27 NOTE — Progress Notes (Signed)
Subjective  CC:  Chief Complaint  Patient presents with   Hypertension    HPI: Donna Andersen is a 82 y.o. female who presents to the office today to address the problems listed above in the chief complaint. Hypertension f/u: Control is fair . Pt reports she is doing well. taking medications as instructed, no medication side effects noted, no TIAs, no chest pain on exertion, no dyspnea on exertion, no swelling of ankles. Home readings are always better than in office but systolic still above goal at 140-145. Diastolic range in the 60s-70s. On benicar 40 daily. She denies adverse effects from his BP medications. Compliance with medication is good.  HLD: now tolerating crestor nightly. Due for recheck. She is fasting.  CKD: no edema.  C/o urinary frequency w/o dysuria or hematuria for about a week. No odor. No vaginal sxs. Typically, this is how a UTI starts for her.  Osteopenia w/ elevated hip FRAX score of 5.2%. we reviewed her dexa results: osteopenia w/ lowest T=-2.3 in several sites. See report from [facility]. Failed fosamax in past due to GI sxs. She is very active; plays pickleball daily. No recent falls.   Assessment  1. White coat syndrome with diagnosis of hypertension   2. Chronic kidney disease, stage 3a (HCC)   3. Mixed hyperlipidemia   4. Urinary frequency   5. Osteopenia, unspecified location   6. Fracture Risk Assessment Score (FRAX) indicating greater than 3% risk for hip fracture      Plan   Hypertension f/u: BP control is fairly well controlled. Will add amldipine 2.5 to benicar. Check renal function and electrolytes. HLD: recheck fasting lipids andlfts today on crestor nightly. Refilled. Tolerating Check for uti Osteopenia: pt to consider prolia. Defers for now. Continue exercise, d and calcium  Education regarding management of these chronic disease states was given. Management strategies discussed on successive visits include dietary and exercise recommendations,  goals of achieving and maintaining IBW, and lifestyle modifications aiming for adequate sleep and minimizing stressors.   Follow up: 6 mo for cpe  Orders Placed This Encounter  Procedures   Urine Culture   Lipid panel   Comprehensive metabolic panel   CBC with Differential/Platelet   Urinalysis, Routine w reflex microscopic   Meds ordered this encounter  Medications   amLODipine (NORVASC) 2.5 MG tablet    Sig: Take 1 tablet (2.5 mg total) by mouth daily.    Dispense:  90 tablet    Refill:  3   rosuvastatin (CRESTOR) 5 MG tablet    Sig: Take 1 tablet (5 mg total) by mouth at bedtime. Take 5mg  by mouth every other day    Dispense:  90 tablet    Refill:  3   cephALEXin (KEFLEX) 500 MG capsule    Sig: Take 1 capsule (500 mg total) by mouth 2 (two) times daily.    Dispense:  10 capsule    Refill:  0      BP Readings from Last 3 Encounters:  09/27/23 (!) 142/68  05/10/23 (!) 144/75  04/22/23 133/77   Wt Readings from Last 3 Encounters:  09/27/23 148 lb 6.4 oz (67.3 kg)  05/30/23 145 lb (65.8 kg)  05/10/23 145 lb (65.8 kg)    Lab Results  Component Value Date   CHOL 213 (H) 03/12/2022   CHOL 214 (H) 03/27/2021   CHOL 216 (H) 04/08/2020   Lab Results  Component Value Date   HDL 60.50 03/12/2022   HDL 58.50 03/27/2021  HDL 59.70 04/08/2020   Lab Results  Component Value Date   LDLCALC 116 (H) 03/12/2022   LDLCALC 126 (H) 03/27/2021   LDLCALC 128 (H) 04/08/2020   Lab Results  Component Value Date   TRIG 185.0 (H) 03/12/2022   TRIG 149.0 03/27/2021   TRIG 138.0 04/08/2020   Lab Results  Component Value Date   CHOLHDL 4 03/12/2022   CHOLHDL 4 03/27/2021   CHOLHDL 4 04/08/2020   Lab Results  Component Value Date   LDLDIRECT 109.0 02/16/2015   LDLDIRECT 145.4 01/26/2013   LDLDIRECT 130.4 01/25/2012   Lab Results  Component Value Date   CREATININE 1.16 (H) 04/05/2023   BUN 21 04/05/2023   NA 139 04/05/2023   K 4.5 04/05/2023   CL 108 04/05/2023    CO2 23 04/05/2023    The ASCVD Risk score (Arnett DK, et al., 2019) failed to calculate for the following reasons:   The 2019 ASCVD risk score is only valid for ages 92 to 1  I reviewed the patients updated PMH, FH, and SocHx.    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    Priority: High   Tubular adenoma of colon 09/07/2021    Priority: High   White coat syndrome with diagnosis of hypertension 11/22/2008    Priority: High   Mixed hyperlipidemia 07/21/2007    Priority: High   Fracture Risk Assessment Score (FRAX) indicating greater than 3% risk for hip fracture 09/27/2023    Priority: Medium    Chronic kidney disease, stage 3a (HCC) 03/12/2022    Priority: Medium    Osteopenia 07/21/2007    Priority: Medium    Environmental allergies 06/27/2018    Priority: Low   Fibrocystic breast changes, bilateral 10/10/2009    Priority: Low   ACE inhibitor intolerance 06/20/2009    Priority: Low   Dermatitis, contact 04/23/2023   Adnexal mass 04/10/2023    Allergies: Bee venom, Atorvastatin, Lisinopril, Losartan potassium, Shrimp extract, Latex, Other, and Sulfonamide derivatives  Social History: Patient  reports that she has never smoked. She has never used smokeless tobacco. She reports current alcohol use. She reports that she does not use drugs.  Current Meds  Medication Sig   amLODipine (NORVASC) 2.5 MG tablet Take 1 tablet (2.5 mg total) by mouth daily.   cephALEXin (KEFLEX) 500 MG capsule Take 1 capsule (500 mg total) by mouth 2 (two) times daily.   docusate sodium (COLACE) 100 MG capsule Take 100 mg by mouth daily as needed for mild constipation.   EPINEPHrine 0.3 mg/0.3 mL IJ SOAJ injection Inject 0.3 mg into the muscle as needed for anaphylaxis.   olmesartan (BENICAR) 40 MG tablet TAKE 1 TABLET BY MOUTH EVERY DAY   SYMBICORT 80-4.5 MCG/ACT inhaler Inhale 2 puffs into the lungs daily as needed (allergies).    [DISCONTINUED] rosuvastatin (CRESTOR) 5 MG tablet TAKE 5MG  BY MOUTH EVERY OTHER DAY    Review of Systems: Cardiovascular: negative for chest pain, palpitations, leg swelling, orthopnea Respiratory: negative for SOB, wheezing or persistent cough Gastrointestinal: negative for abdominal pain Genitourinary: negative for dysuria or gross hematuria  Objective  Vitals: BP (!) 142/68 Comment: by home reading  Pulse 68   Temp 97.8 F (36.6 C)   Ht 5' (1.524 m)   Wt 148 lb 6.4 oz (67.3 kg)   LMP  (LMP Unknown)   SpO2 98%   BMI 28.98 kg/m  General: no acute distress  Psych:  Alert and oriented, normal mood and affect HEENT:  Normocephalic, atraumatic, supple neck  Cardiovascular:  RRR without murmur. no edema Respiratory:  Good breath sounds bilaterally, CTAB with normal respiratory effort Skin:  Warm, no rashes Neurologic:   Mental status is normal Commons side effects, risks, benefits, and alternatives for medications and treatment plan prescribed today were discussed, and the patient expressed understanding of the given instructions. Patient is instructed to call or message via MyChart if he/she has any questions or concerns regarding our treatment plan. No barriers to understanding were identified. We discussed Red Flag symptoms and signs in detail. Patient expressed understanding regarding what to do in case of urgent or emergency type symptoms.  Medication list was reconciled, printed and provided to the patient in AVS. Patient instructions and summary information was reviewed with the patient as documented in the AVS. This note was prepared with assistance of Dragon voice recognition software. Occasional wrong-word or sound-a-like substitutions may have occurred due to the inherent limitation

## 2023-09-29 ENCOUNTER — Other Ambulatory Visit: Payer: Self-pay | Admitting: Family Medicine

## 2023-09-29 LAB — URINE CULTURE
MICRO NUMBER:: 15768687
SPECIMEN QUALITY:: ADEQUATE

## 2023-09-29 MED ORDER — AMOXICILLIN 500 MG PO CAPS: 500.0000 mg | ORAL_CAPSULE | Freq: Three times a day (TID) | ORAL | 0 refills | Status: AC

## 2023-09-29 NOTE — Progress Notes (Signed)
Spoke w/pt re urine cx.  Enterococcus-prob not sensitive to keflex(hasn't started).  Amoxicillin sent in.

## 2023-09-30 ENCOUNTER — Ambulatory Visit: Payer: Medicare HMO | Admitting: Family Medicine

## 2023-10-10 DIAGNOSIS — Z905 Acquired absence of kidney: Secondary | ICD-10-CM | POA: Diagnosis not present

## 2023-10-10 DIAGNOSIS — K5750 Diverticulosis of both small and large intestine without perforation or abscess without bleeding: Secondary | ICD-10-CM | POA: Diagnosis not present

## 2023-10-10 DIAGNOSIS — Z85528 Personal history of other malignant neoplasm of kidney: Secondary | ICD-10-CM | POA: Diagnosis not present

## 2023-10-10 DIAGNOSIS — D49512 Neoplasm of unspecified behavior of left kidney: Secondary | ICD-10-CM | POA: Diagnosis not present

## 2023-10-10 DIAGNOSIS — C439 Malignant melanoma of skin, unspecified: Secondary | ICD-10-CM | POA: Diagnosis not present

## 2023-10-17 DIAGNOSIS — N281 Cyst of kidney, acquired: Secondary | ICD-10-CM | POA: Diagnosis not present

## 2023-10-17 DIAGNOSIS — C642 Malignant neoplasm of left kidney, except renal pelvis: Secondary | ICD-10-CM | POA: Diagnosis not present

## 2023-10-24 DIAGNOSIS — M9903 Segmental and somatic dysfunction of lumbar region: Secondary | ICD-10-CM | POA: Diagnosis not present

## 2023-10-24 DIAGNOSIS — M9905 Segmental and somatic dysfunction of pelvic region: Secondary | ICD-10-CM | POA: Diagnosis not present

## 2023-10-24 DIAGNOSIS — M9901 Segmental and somatic dysfunction of cervical region: Secondary | ICD-10-CM | POA: Diagnosis not present

## 2023-10-24 DIAGNOSIS — M25562 Pain in left knee: Secondary | ICD-10-CM | POA: Diagnosis not present

## 2023-10-24 DIAGNOSIS — M9904 Segmental and somatic dysfunction of sacral region: Secondary | ICD-10-CM | POA: Diagnosis not present

## 2023-10-24 DIAGNOSIS — G43C0 Periodic headache syndromes in child or adult, not intractable: Secondary | ICD-10-CM | POA: Diagnosis not present

## 2023-10-24 DIAGNOSIS — M9906 Segmental and somatic dysfunction of lower extremity: Secondary | ICD-10-CM | POA: Diagnosis not present

## 2023-10-24 DIAGNOSIS — M859 Disorder of bone density and structure, unspecified: Secondary | ICD-10-CM | POA: Diagnosis not present

## 2024-01-03 ENCOUNTER — Other Ambulatory Visit: Payer: Self-pay | Admitting: Family Medicine

## 2024-01-10 DIAGNOSIS — Z1231 Encounter for screening mammogram for malignant neoplasm of breast: Secondary | ICD-10-CM | POA: Diagnosis not present

## 2024-01-10 DIAGNOSIS — R92323 Mammographic fibroglandular density, bilateral breasts: Secondary | ICD-10-CM | POA: Diagnosis not present

## 2024-01-10 LAB — HM MAMMOGRAPHY

## 2024-01-12 ENCOUNTER — Other Ambulatory Visit: Payer: Self-pay | Admitting: Family Medicine

## 2024-01-13 DIAGNOSIS — M9904 Segmental and somatic dysfunction of sacral region: Secondary | ICD-10-CM | POA: Diagnosis not present

## 2024-01-13 DIAGNOSIS — M9903 Segmental and somatic dysfunction of lumbar region: Secondary | ICD-10-CM | POA: Diagnosis not present

## 2024-01-13 DIAGNOSIS — M25561 Pain in right knee: Secondary | ICD-10-CM | POA: Diagnosis not present

## 2024-01-13 DIAGNOSIS — M25579 Pain in unspecified ankle and joints of unspecified foot: Secondary | ICD-10-CM | POA: Diagnosis not present

## 2024-01-13 DIAGNOSIS — M9908 Segmental and somatic dysfunction of rib cage: Secondary | ICD-10-CM | POA: Diagnosis not present

## 2024-01-13 DIAGNOSIS — M9905 Segmental and somatic dysfunction of pelvic region: Secondary | ICD-10-CM | POA: Diagnosis not present

## 2024-01-13 DIAGNOSIS — M25562 Pain in left knee: Secondary | ICD-10-CM | POA: Diagnosis not present

## 2024-01-13 DIAGNOSIS — M9902 Segmental and somatic dysfunction of thoracic region: Secondary | ICD-10-CM | POA: Diagnosis not present

## 2024-01-13 DIAGNOSIS — M24673 Ankylosis, unspecified ankle: Secondary | ICD-10-CM | POA: Diagnosis not present

## 2024-01-13 DIAGNOSIS — M9906 Segmental and somatic dysfunction of lower extremity: Secondary | ICD-10-CM | POA: Diagnosis not present

## 2024-01-14 DIAGNOSIS — H52201 Unspecified astigmatism, right eye: Secondary | ICD-10-CM | POA: Diagnosis not present

## 2024-01-14 DIAGNOSIS — D3131 Benign neoplasm of right choroid: Secondary | ICD-10-CM | POA: Diagnosis not present

## 2024-01-14 DIAGNOSIS — H04222 Epiphora due to insufficient drainage, left lacrimal gland: Secondary | ICD-10-CM | POA: Diagnosis not present

## 2024-01-14 DIAGNOSIS — H26491 Other secondary cataract, right eye: Secondary | ICD-10-CM | POA: Diagnosis not present

## 2024-01-14 DIAGNOSIS — Z961 Presence of intraocular lens: Secondary | ICD-10-CM | POA: Diagnosis not present

## 2024-02-06 ENCOUNTER — Other Ambulatory Visit: Payer: Self-pay | Admitting: Urology

## 2024-02-06 DIAGNOSIS — C642 Malignant neoplasm of left kidney, except renal pelvis: Secondary | ICD-10-CM

## 2024-02-06 DIAGNOSIS — N281 Cyst of kidney, acquired: Secondary | ICD-10-CM

## 2024-02-11 DIAGNOSIS — J3089 Other allergic rhinitis: Secondary | ICD-10-CM | POA: Diagnosis not present

## 2024-02-11 DIAGNOSIS — J3081 Allergic rhinitis due to animal (cat) (dog) hair and dander: Secondary | ICD-10-CM | POA: Diagnosis not present

## 2024-02-11 DIAGNOSIS — T63451D Toxic effect of venom of hornets, accidental (unintentional), subsequent encounter: Secondary | ICD-10-CM | POA: Diagnosis not present

## 2024-02-11 DIAGNOSIS — J301 Allergic rhinitis due to pollen: Secondary | ICD-10-CM | POA: Diagnosis not present

## 2024-02-24 DIAGNOSIS — I10 Essential (primary) hypertension: Secondary | ICD-10-CM | POA: Diagnosis not present

## 2024-02-24 DIAGNOSIS — R531 Weakness: Secondary | ICD-10-CM | POA: Diagnosis not present

## 2024-02-24 DIAGNOSIS — M9904 Segmental and somatic dysfunction of sacral region: Secondary | ICD-10-CM | POA: Diagnosis not present

## 2024-02-24 DIAGNOSIS — M9905 Segmental and somatic dysfunction of pelvic region: Secondary | ICD-10-CM | POA: Diagnosis not present

## 2024-02-24 DIAGNOSIS — M9908 Segmental and somatic dysfunction of rib cage: Secondary | ICD-10-CM | POA: Diagnosis not present

## 2024-02-24 DIAGNOSIS — M25561 Pain in right knee: Secondary | ICD-10-CM | POA: Diagnosis not present

## 2024-02-24 DIAGNOSIS — M79661 Pain in right lower leg: Secondary | ICD-10-CM | POA: Diagnosis not present

## 2024-02-24 DIAGNOSIS — M9906 Segmental and somatic dysfunction of lower extremity: Secondary | ICD-10-CM | POA: Diagnosis not present

## 2024-02-24 DIAGNOSIS — M9903 Segmental and somatic dysfunction of lumbar region: Secondary | ICD-10-CM | POA: Diagnosis not present

## 2024-03-04 ENCOUNTER — Encounter (INDEPENDENT_AMBULATORY_CARE_PROVIDER_SITE_OTHER): Payer: Self-pay | Admitting: Otolaryngology

## 2024-03-10 ENCOUNTER — Encounter: Payer: Self-pay | Admitting: Urology

## 2024-03-27 ENCOUNTER — Ambulatory Visit: Payer: Medicare HMO | Admitting: Family Medicine

## 2024-03-27 ENCOUNTER — Encounter: Payer: Self-pay | Admitting: Family Medicine

## 2024-03-27 VITALS — BP 120/70 | HR 74 | Temp 98.2°F | Wt 146.6 lb

## 2024-03-27 DIAGNOSIS — N1831 Chronic kidney disease, stage 3a: Secondary | ICD-10-CM

## 2024-03-27 DIAGNOSIS — I10 Essential (primary) hypertension: Secondary | ICD-10-CM | POA: Diagnosis not present

## 2024-03-27 DIAGNOSIS — Z0001 Encounter for general adult medical examination with abnormal findings: Secondary | ICD-10-CM

## 2024-03-27 DIAGNOSIS — Z9189 Other specified personal risk factors, not elsewhere classified: Secondary | ICD-10-CM

## 2024-03-27 DIAGNOSIS — D126 Benign neoplasm of colon, unspecified: Secondary | ICD-10-CM | POA: Diagnosis not present

## 2024-03-27 DIAGNOSIS — E782 Mixed hyperlipidemia: Secondary | ICD-10-CM

## 2024-03-27 DIAGNOSIS — M8580 Other specified disorders of bone density and structure, unspecified site: Secondary | ICD-10-CM | POA: Diagnosis not present

## 2024-03-27 DIAGNOSIS — C642 Malignant neoplasm of left kidney, except renal pelvis: Secondary | ICD-10-CM | POA: Diagnosis not present

## 2024-03-27 DIAGNOSIS — Z0000 Encounter for general adult medical examination without abnormal findings: Secondary | ICD-10-CM | POA: Diagnosis not present

## 2024-03-27 LAB — COMPREHENSIVE METABOLIC PANEL WITH GFR
ALT: 13 U/L (ref 0–35)
AST: 15 U/L (ref 0–37)
Albumin: 4.6 g/dL (ref 3.5–5.2)
Alkaline Phosphatase: 95 U/L (ref 39–117)
BUN: 28 mg/dL — ABNORMAL HIGH (ref 6–23)
CO2: 25 meq/L (ref 19–32)
Calcium: 9.6 mg/dL (ref 8.4–10.5)
Chloride: 105 meq/L (ref 96–112)
Creatinine, Ser: 1.18 mg/dL (ref 0.40–1.20)
GFR: 43.47 mL/min — ABNORMAL LOW (ref >60.00)
Glucose, Bld: 100 mg/dL — ABNORMAL HIGH (ref 70–99)
Potassium: 5 meq/L (ref 3.5–5.1)
Sodium: 139 meq/L (ref 135–145)
Total Bilirubin: 0.5 mg/dL (ref 0.2–1.2)
Total Protein: 7.4 g/dL (ref 6.0–8.3)

## 2024-03-27 LAB — CBC WITH DIFFERENTIAL/PLATELET
Basophils Absolute: 0.1 10*3/uL (ref 0.0–0.1)
Basophils Relative: 1.1 % (ref 0.0–3.0)
Eosinophils Absolute: 0.3 10*3/uL (ref 0.0–0.7)
Eosinophils Relative: 4.3 % (ref 0.0–5.0)
HCT: 39.3 % (ref 36.0–46.0)
Hemoglobin: 13.2 g/dL (ref 12.0–15.0)
Lymphocytes Relative: 20.9 % (ref 12.0–46.0)
Lymphs Abs: 1.4 10*3/uL (ref 0.7–4.0)
MCHC: 33.5 g/dL (ref 30.0–36.0)
MCV: 91.2 fl (ref 78.0–100.0)
Monocytes Absolute: 0.5 10*3/uL (ref 0.1–1.0)
Monocytes Relative: 6.8 % (ref 3.0–12.0)
Neutro Abs: 4.6 10*3/uL (ref 1.4–7.7)
Neutrophils Relative %: 66.9 % (ref 43.0–77.0)
Platelets: 272 10*3/uL (ref 150.0–400.0)
RBC: 4.31 Mil/uL (ref 3.87–5.11)
RDW: 12.9 % (ref 11.5–15.5)
WBC: 6.9 10*3/uL (ref 4.0–10.5)

## 2024-03-27 LAB — LIPID PANEL
Cholesterol: 250 mg/dL — ABNORMAL HIGH (ref 0–200)
HDL: 59.3 mg/dL (ref >39.00)
LDL Cholesterol: 155 mg/dL — ABNORMAL HIGH (ref 0–99)
NonHDL: 190.37
Total CHOL/HDL Ratio: 4
Triglycerides: 179 mg/dL — ABNORMAL HIGH (ref 0.0–149.0)
VLDL: 35.8 mg/dL (ref 0.0–40.0)

## 2024-03-27 LAB — TSH: TSH: 1.69 u[IU]/mL (ref 0.35–5.50)

## 2024-03-27 MED ORDER — OLMESARTAN MEDOXOMIL 20 MG PO TABS: 20.0000 mg | ORAL_TABLET | Freq: Every day | ORAL | 3 refills | Status: DC

## 2024-03-27 NOTE — Patient Instructions (Signed)
 Please return in 6 months for hypertension follow up. For follow up on chronic medical conditions   I will release your lab results to you on your MyChart account with further instructions. You may see the results before I do, but when I review them I will send you a message with my report or have my assistant call you if things need to be discussed. Please reply to my message with any questions. Thank you!   If you have any questions or concerns, please don't hesitate to send me a message via MyChart or call the office at [phone]. Thank you for visiting with us  today! It's our pleasure caring for you.

## 2024-03-27 NOTE — Progress Notes (Signed)
 Subjective  Chief Complaint  Patient presents with   Annual Exam    Pt here for Annual Exam and is currently fasting. Pt brough in a copy of her home reading and they are all normal. Between 120/70    HPI: Donna Andersen is a 83 y.o. female who presents to [facility] [place] today for a Female Wellness Visit. She also has the concerns and/or needs as listed above in the chief complaint. These will be addressed in addition to the Health Maintenance Visit.   Wellness Visit: annual visit with health maintenance review and exam  HM: screens are current.  Doing well.  Continues to play pickle ball multiple times during the week.  Immunizations are current.  Eye exam current.  No mood or cognitive concerns Chronic disease f/u and/or acute problem visit: (deemed necessary to be done in addition to the wellness visit): Discussed the use of AI scribe software for clinical note transcription with the patient, who gave verbal consent to proceed.  History of Present Illness Donna Andersen is an 83 year old female with hypertension who presents with weakness and leg shakiness.  She has been experiencing significant weakness and leg shakiness for approximately one month, primarily occurring when sitting down. Her legs feel 'shaky', and she attributes these symptoms to her medication, specifically amlodipine , which she started recently.  She is currently taking amlodipine  2.5 mg and olmesartan  40 mg for hypertension. Due to the shakiness, she independently reduced her olmesartan  dose to 20 mg by cutting the pill in half. This adjustment has improved her symptoms, though some shakiness persists.  Her blood pressures were running low.  Now, averaging 100-1 tens over 60s.  She is feeling much better  No lightheadedness or dizziness is reported, but her legs feel heavy and shaky after playing three games of pickleball, whereas she previously played four or five games without issue. She does  not experience muscle pain and is tolerating Crestor  every other day, as she has been unable to increase the frequency due to intolerance.  She recalls having a bladder infection during her last visit, which has since resolved, although she was urinating frequently at the time. She continues to play pickleball regularly, though she notes increased soreness, which she attributes to aging.  She experiences dry eyes frequently and manages the symptoms by wiping her eyes.  Renal cell carcinoma for follow-up next month.  She will have CT and MRI done.  She feels well.  Hyperlipidemia tolerating Crestor  5 mg 3 times a week.  Cannot tolerate higher doses we have tried.  Has not tried Zetia but is not currently interested in adding medications.  This is likely reasonable given her age and no clear history of cardiovascular disease.  She is active.  Chronic kidney disease: No lower extremity edema nausea.  Due for recheck.  Osteopenia with elevated FRAX score.  Not EXTR medications at this time.  Reviewed most recent density DEXA scan from November.  Stable osteopenia   Assessment  1. Encounter for well adult exam with abnormal findings   2. Clear cell renal cell carcinoma, left (HCC)   3. Tubular adenoma of colon   4. White coat syndrome with diagnosis of hypertension   5. Mixed hyperlipidemia   6. Chronic kidney disease, stage 3a (HCC)   7. Fracture Risk Assessment Score (FRAX) indicating greater than 3% risk for hip fracture   8. Osteopenia, unspecified location      Plan  Female  Wellness Visit: Age appropriate Health Maintenance and Prevention measures were discussed with patient. Included topics are cancer screening recommendations, ways to keep healthy (see AVS) including dietary and exercise recommendations, regular eye and dental care, use of seat belts, and avoidance of moderate alcohol use and tobacco use.  BMI: discussed patient's BMI and encouraged positive lifestyle modifications to  help get to or maintain a target BMI. HM needs and immunizations were addressed and ordered. See below for orders. See HM and immunization section for updates. Routine labs and screening tests ordered including cmp, cbc and lipids where appropriate. Discussed recommendations regarding Vit D and calcium  supplementation (see AVS)  Chronic disease management visit and/or acute problem visit: [place] hypertension is well-controlled.  Adjust medicines: Amlodipine  2.5 daily and olmesartan  20 mg daily.  Can decrease to half tab of the olmesartan  if needed.  Patient will monitor.  Check renal function electrolytes today monitoring chronic kidney disease.  Avoid NSAIDs Hyperlipidemia Crestor  5 mg 3 times weekly.  Monitoring levels.  No further education adjustments will be made. Osteopenia: Stable.  Continue weightbearing exercise, calcium  and vitamin D .  Recheck bone density in 2 years. Renal cell carcinoma: Follow-up with urology next month.  Await scans.  Clinically stable History of tubular adenoma: No further colon cancer screenings recommended per GI.  No GI symptoms  Follow up: 6 months to recheck blood pressure and follow-up chronic problems No orders of the defined types were placed in this encounter.  No orders of the defined types were placed in this encounter.     Body mass index is 28.63 kg/m. Wt Readings from Last 3 Encounters:  03/27/24 146 lb 9.6 oz (66.5 kg)  09/27/23 148 lb 6.4 oz (67.3 kg)  05/30/23 145 lb (65.8 kg)     Patient Active Problem List   Diagnosis Date Noted Date Diagnosed   Clear cell renal cell carcinoma, left (HCC) 09/07/2021     Priority: High    Left nephrectomy 09/2021. Dr. 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  If final  pathology showed clear-cell renal cell carcinoma with sarcomatoid component with a final pathological staging is T3a with nuclear grade 4.      Pembrolizumab  200 mg adjuvantly started on January 09, 2022.  She completed 9 cycles of therapy concluded in September 2023.   Current therapy: Active surveillance.    Tubular adenoma of colon 09/07/2021     Priority: High   White coat syndrome with diagnosis of hypertension 11/22/2008     Priority: High   Mixed hyperlipidemia 07/21/2007     Priority: High    therapy is limited by multiple perceived drug intolerances Tolerates 5mg  every other day crestor       Fracture Risk Assessment Score (FRAX) indicating greater than 3% risk for hip fracture 09/27/2023     Priority: Medium     DEXA 09/2023, 5%, offered prolia, pt defers but will consider it. Osteopenia lowest t = -2.3    Chronic kidney disease, stage 3a (HCC) 03/12/2022     Priority: Medium    Osteopenia 07/21/2007     Priority: Medium     Fosamax in 2013. Did not tolerate. BMD improved from increased weight bearing exercise on last dexa 04/2018. Does not want other medications. FrAX score was 4.3% for hip. Risks discussed. Okay with continuing calcium  and vitamin  D DEXA 09/2023: lowest T = -2.4 with elevated FRAX hip 5.2%, offered prolia: she will consider it but defers for now; plays pickleball daily, vit D and calcium . [facility]     Environmental allergies 06/27/2018     Priority: Low    Followed at [place] [facility]. Dr. Oscar Blazing     Fibrocystic breast changes, bilateral 10/10/2009     Priority: Low   ACE inhibitor intolerance 06/20/2009     Priority: Low   Dermatitis, contact 04/23/2023    Adnexal mass 04/10/2023    Health Maintenance  Topic Date Due   COVID-19 Vaccine (6 - 2024-25 season) 04/12/2024 (Originally 07/07/2023)   Medicare Annual Wellness (AWV)  05/29/2024   MAMMOGRAM  01/09/2025   DEXA SCAN  09/09/2025   Pneumonia Vaccine 80+ Years old  Completed   HPV  VACCINES  Aged Out   Meningococcal B Vaccine  Aged Out   DTaP/Tdap/Td  Discontinued   INFLUENZA VACCINE  Discontinued   Colonoscopy  Discontinued   Hepatitis C Screening  Discontinued   Zoster Vaccines- Shingrix  Discontinued   Immunization History  Administered Date(s) Administered   PFIZER(Purple Top)SARS-COV-2 Vaccination 12/10/2019, 01/04/2020, 03/17/2020, 09/28/2020, 02/05/2022   Pneumococcal Conjugate-13 02/16/2015   Pneumococcal Polysaccharide-23 10/11/2008, 09/20/2016, 09/13/2017, 09/12/2018, 09/01/2019, 03/17/2020   Zoster, Live 02/22/2015   We updated and reviewed the patient's past history in detail and it is documented below. Allergies: Patient is allergic to bee venom, atorvastatin, lisinopril, losartan potassium, shrimp extract, latex, other, and sulfonamide derivatives. Past Medical History Patient  has a past medical history of Acute cystitis (09/26/2007), Allergy, Asthma, Atypical nevus (11/17/2002), Atypical nevus (11/17/2002), Atypical nevus (04/23/2006), Atypical nevus (04/23/2006), Atypical nevus (04/15/2012), Atypical nevus (08/25/2014), Atypical nevus (08/25/2014), Basal cell carcinoma (08/25/2014), COUGH DUE TO ACE INHIBITORS (06/20/2009), FIBROCYSTIC BREAST DISEASE (10/10/2009), GOITER, MULTINODULAR (10/11/2009), HEMATURIA UNSPECIFIED (10/05/2008), HYPERGLYCEMIA (09/26/2007), HYPERLIPIDEMIA (07/21/2007), HYPERTENSION (11/22/2008), INSOMNIA (06/08/2008), Melanoma in situ (HCC) (03/07/2011), Melanoma in situ (HCC) (05/07/2011), MYALGIA (10/23/2010), OSTEOPOROSIS (07/21/2007), Renal cell carcinoma (HCC) (09/07/2021), 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 Past Surgical History Patient  has a past surgical history that includes Vaginal hysterectomy (11/05/1972); Thyroid  surgery (11/05/2009); Breast lumpectomy (11/05/1974); Vaginal delivery; Colonoscopy; Polypectomy; Robot assisted laparoscopic nephrectomy (Left, 09/27/2021); Skin cancer  excision; and Robotic assisted salpingo oophorectomy (Bilateral, 04/10/2023). Family History: Patient family history includes Breast cancer in her maternal aunt; Cancer in her mother and sister; Hypertension in her mother; Leukemia in her sister; Lymphoma in her sister; Ovarian cancer in her mother; Thyroid  disease in her mother. Social History:  Patient  reports that she has never smoked. She has never used smokeless tobacco. She reports current alcohol use. She reports that she does not use drugs.  Review of Systems: Constitutional: negative for fever or malaise Ophthalmic: negative for photophobia, double vision or loss of vision Cardiovascular: negative for chest pain, dyspnea on exertion, or new LE swelling Respiratory: negative for SOB or persistent cough Gastrointestinal: negative for abdominal pain, change in bowel habits or melena Genitourinary: negative for dysuria or gross hematuria, no abnormal uterine bleeding or disharge Musculoskeletal: negative for new gait disturbance or muscular weakness Integumentary: negative for new or persistent rashes, no breast lumps Neurological: negative for TIA or stroke symptoms Psychiatric: negative for SI or delusions Allergic/Immunologic: negative for hives  Patient Care Team    Relationship Specialty Notifications Start End  Luevenia Saha, MD PCP - General Family Medicine  07/04/21   Seabron Cypress, Timmothy Foots, MD Consulting Physician Allergy and Immunology  09/17/18   Devon Fogo, MD (Inactive) Consulting Physician Dermatology  04/02/19   Dema Filler, MD Consulting Physician Ophthalmology  04/02/19     Objective  Vitals: BP (!) 143/64   Pulse 74   Temp 98.2 F (36.8 C)   Wt 146 lb 9.6 oz (66.5 kg)   LMP  (LMP Unknown)   SpO2 100%   BMI 28.63 kg/m  General:  Well developed, well nourished, no acute distress.,  Looks well Psych:  Alert and orientedx3,normal mood and affect HEENT:  Normocephalic, atraumatic, non-icteric sclera,  watering eyes bilaterally, clear conjunctiva, supple neck without adenopathy, mass or thyromegaly Cardiovascular:  Normal S1, S2, RRR without gallop, rub or murmur Respiratory:  Good breath sounds bilaterally, CTAB with normal respiratory effort Gastrointestinal: normal bowel sounds, soft, non-tender, no noted masses. No HSM MSK: extremities without edema, joints without erythema or swelling Neurologic:    Mental status is normal.  Gross motor and sensory exams are normal.  No tremor  Commons side effects, risks, benefits, and alternatives for medications and treatment plan prescribed today were discussed, and the patient expressed understanding of the given instructions. Patient is instructed to call or message via MyChart if he/she has any questions or concerns regarding our treatment plan. No barriers to understanding were identified. We discussed Red Flag symptoms and signs in detail. Patient expressed understanding regarding what to do in case of urgent or emergency type symptoms.  Medication list was reconciled, printed and provided to the patient in AVS. Patient instructions and summary information was reviewed with the patient as documented in the AVS. This note was prepared with assistance of Dragon voice recognition software. Occasional wrong-word or sound-a-like substitutions may have occurred due to the inherent limitations of voice recognition software

## 2024-03-31 ENCOUNTER — Ambulatory Visit: Payer: Self-pay | Admitting: Family Medicine

## 2024-03-31 NOTE — Progress Notes (Signed)
 See mychart note Dear Ms. Tewksbury, Always good seeing you! Your labs are stable, although your cholesterol is higher.  Your kidney function is a little lower than in the past as well: this could be related to your blood pressure running a little too low so make the adjustments we discussed in the olmesartan  and I will monitor it.  No other changes are needed at this time.  Sincerely, Dr. Jonelle Neri

## 2024-04-06 ENCOUNTER — Other Ambulatory Visit

## 2024-04-06 DIAGNOSIS — M9903 Segmental and somatic dysfunction of lumbar region: Secondary | ICD-10-CM | POA: Diagnosis not present

## 2024-04-06 DIAGNOSIS — R531 Weakness: Secondary | ICD-10-CM | POA: Diagnosis not present

## 2024-04-06 DIAGNOSIS — M7551 Bursitis of right shoulder: Secondary | ICD-10-CM | POA: Diagnosis not present

## 2024-04-06 DIAGNOSIS — I10 Essential (primary) hypertension: Secondary | ICD-10-CM | POA: Diagnosis not present

## 2024-04-06 DIAGNOSIS — S46211D Strain of muscle, fascia and tendon of other parts of biceps, right arm, subsequent encounter: Secondary | ICD-10-CM | POA: Diagnosis not present

## 2024-04-06 DIAGNOSIS — M9907 Segmental and somatic dysfunction of upper extremity: Secondary | ICD-10-CM | POA: Diagnosis not present

## 2024-04-06 DIAGNOSIS — M9902 Segmental and somatic dysfunction of thoracic region: Secondary | ICD-10-CM | POA: Diagnosis not present

## 2024-04-06 DIAGNOSIS — M9901 Segmental and somatic dysfunction of cervical region: Secondary | ICD-10-CM | POA: Diagnosis not present

## 2024-04-06 DIAGNOSIS — M25511 Pain in right shoulder: Secondary | ICD-10-CM | POA: Diagnosis not present

## 2024-04-06 DIAGNOSIS — M9908 Segmental and somatic dysfunction of rib cage: Secondary | ICD-10-CM | POA: Diagnosis not present

## 2024-04-08 ENCOUNTER — Ambulatory Visit
Admission: RE | Admit: 2024-04-08 | Discharge: 2024-04-08 | Disposition: A | Discharge status: Home / Self Care | Source: Ambulatory Visit | Attending: Urology | Admitting: Urology

## 2024-04-08 DIAGNOSIS — N281 Cyst of kidney, acquired: Secondary | ICD-10-CM

## 2024-04-08 DIAGNOSIS — Z85528 Personal history of other malignant neoplasm of kidney: Secondary | ICD-10-CM | POA: Diagnosis not present

## 2024-04-08 DIAGNOSIS — Z905 Acquired absence of kidney: Secondary | ICD-10-CM | POA: Diagnosis not present

## 2024-04-08 DIAGNOSIS — C642 Malignant neoplasm of left kidney, except renal pelvis: Secondary | ICD-10-CM

## 2024-04-08 MED ORDER — GADOPICLENOL 0.5 MMOL/ML IV SOLN
7.0000 mL | Freq: Once | INTRAVENOUS | Status: AC | PRN
  Administered 2024-04-08: 7 mL via INTRAVENOUS

## 2024-04-15 DIAGNOSIS — R918 Other nonspecific abnormal finding of lung field: Secondary | ICD-10-CM | POA: Diagnosis not present

## 2024-04-15 DIAGNOSIS — E041 Nontoxic single thyroid nodule: Secondary | ICD-10-CM | POA: Diagnosis not present

## 2024-04-15 DIAGNOSIS — Z85528 Personal history of other malignant neoplasm of kidney: Secondary | ICD-10-CM | POA: Diagnosis not present

## 2024-04-15 DIAGNOSIS — C642 Malignant neoplasm of left kidney, except renal pelvis: Secondary | ICD-10-CM | POA: Diagnosis not present

## 2024-04-20 DIAGNOSIS — N281 Cyst of kidney, acquired: Secondary | ICD-10-CM | POA: Diagnosis not present

## 2024-04-20 DIAGNOSIS — C642 Malignant neoplasm of left kidney, except renal pelvis: Secondary | ICD-10-CM | POA: Diagnosis not present

## 2024-05-12 ENCOUNTER — Ambulatory Visit (INDEPENDENT_AMBULATORY_CARE_PROVIDER_SITE_OTHER): Admitting: Audiology

## 2024-05-12 ENCOUNTER — Ambulatory Visit (INDEPENDENT_AMBULATORY_CARE_PROVIDER_SITE_OTHER): Admitting: Otolaryngology

## 2024-05-12 VITALS — BP 140/75 | HR 78 | Ht 60.0 in | Wt 145.0 lb

## 2024-05-12 DIAGNOSIS — H9312 Tinnitus, left ear: Secondary | ICD-10-CM

## 2024-05-13 DIAGNOSIS — H9312 Tinnitus, left ear: Secondary | ICD-10-CM | POA: Insufficient documentation

## 2024-05-13 NOTE — Progress Notes (Signed)
 CC: Intermittent left ear tinnitus  HPI:  Donna Andersen is a 83 y.o. female who presents today complaining of intermittent left ear tinnitus since January 2025.  Her symptoms started after she went to a loud concert.  She denies any significant hearing difficulty.  She has no recent otitis media or otitis externa.  She has no previous otologic surgery.  She has a history of environmental allergies.  She uses Flonase and Astelin as needed.  Currently she denies any otalgia, otorrhea, facial pain, or fever.  She has no previous otologic surgery.  Past Medical History:  Diagnosis Date   Acute cystitis 09/26/2007   recurrent UTI's   Allergy    Asthma    as a child   Atypical nevus 11/17/2002   Upper Center Back - Moderate   Atypical nevus 11/17/2002   Right Lower Abdomen - Moderate   Atypical nevus 04/23/2006   Left Upper Arm - Slight to Moderate   Atypical nevus 04/23/2006   Left Outer Lower Leg - Slight to Moderate   Atypical nevus 04/15/2012   Left Upper Buttock - Mild   Atypical nevus 08/25/2014   Right Inner Knee - Moderate   Atypical nevus 08/25/2014   Left Scapula - Severe   Basal cell carcinoma 08/25/2014   left axilla tx -curet and cautery   COUGH DUE TO ACE INHIBITORS 06/20/2009   FIBROCYSTIC BREAST DISEASE 10/10/2009   GOITER, MULTINODULAR 10/11/2009   Benign L thyroid  nodule   HEMATURIA UNSPECIFIED 10/05/2008   HYPERGLYCEMIA 09/26/2007   HYPERLIPIDEMIA 07/21/2007   HYPERTENSION 11/22/2008   INSOMNIA 06/08/2008   Melanoma in situ (HCC) 03/07/2011   Base Of Post Neck   Melanoma in situ [facility]) 05/07/2011   Base Of Post Neck - Clear   MYALGIA 10/23/2010   OSTEOPOROSIS 07/21/2007   Renal cell carcinoma (HCC) 09/07/2021   Right nephrectomy 09/2021. Dr. 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   Right Buccal Cheek (in situ)([id number])   WEIGHT GAIN 06/08/2008    Past Surgical History:  Procedure Laterality Date   BREAST LUMPECTOMY   11/05/1974   benign   COLONOSCOPY     POLYPECTOMY     ROBOT ASSISTED LAPAROSCOPIC NEPHRECTOMY Left 09/27/2021   Procedure: XI ROBOTIC ASSISTED LAPAROSCOPIC RADICAL NEPHRECTOMY;  Surgeon: Devere Lonni Righter, MD;  Location: [facility];  Service: Urology;  Laterality: Left;   ROBOTIC ASSISTED SALPINGO OOPHERECTOMY Bilateral 04/10/2023   Procedure: XI ROBOTIC ASSISTED SALPINGO OOPHORECTOMY;  Surgeon: Viktoria Comer JONELLE, MD;  Location: [facility];  Service: Gynecology;  Laterality: Bilateral;   SKIN CANCER EXCISION     THYROID  SURGERY  11/05/2009   left nodule removed   VAGINAL DELIVERY     x1   VAGINAL HYSTERECTOMY  11/05/1972    Family History  Problem Relation Age of Onset   Cancer Mother        Ovarian Cancer   Ovarian cancer Mother    Hypertension Mother    Thyroid  disease Mother    Lymphoma Sister    Cancer Sister        Breast Cancer, Kidney Cancer   Leukemia Sister    Breast cancer Maternal Aunt    Colon cancer Neg Hx    Rectal cancer Neg Hx    Stomach cancer Neg Hx    Endometrial cancer Neg Hx    Prostate cancer Neg Hx    Pancreatic cancer Neg Hx     Social History:  reports that she has never  smoked. She has never used smokeless tobacco. She reports current alcohol use. She reports that she does not use drugs.  Allergies:  Allergies  Allergen Reactions   Bee Venom Hives   Atorvastatin     REACTION: dental pain, myalgias, and headache   Lisinopril Cough   Losartan Potassium     REACTION: drowsiness   Shrimp Extract Hives    Breaks out in welts   Latex Itching, Rash and Other (See Comments)     scars   Other Hives and Rash    Tape. Tape - scars, itching from latex tape   Sulfonamide Derivatives Hives and Rash    REACTION: Rash, Hives, Asthma    Prior to Admission medications   Medication Sig Start Date End Date Taking? Authorizing Provider  amLODipine  (NORVASC ) 2.5 MG tablet Take 1 tablet (2.5 mg total) by mouth daily. 09/27/23  Yes Jodie Lavern CROME, MD   docusate sodium  (COLACE) 100 MG capsule Take 100 mg by mouth daily as needed for mild constipation.   Yes [provider]  EPINEPHrine 0.3 mg/0.3 mL IJ SOAJ injection Inject 0.3 mg into the muscle as needed for anaphylaxis.   Yes [provider]  olmesartan  (BENICAR ) 20 MG tablet Take 1 tablet (20 mg total) by mouth daily. 03/27/24  Yes Jodie Lavern CROME, MD  rosuvastatin  (CRESTOR ) 5 MG tablet TAKE 1 TABLET BY MOUTH EVERY OTHER DAY 01/05/24  Yes Jodie Lavern CROME, MD  SYMBICORT 80-4.5 MCG/ACT inhaler Inhale 2 puffs into the lungs daily as needed (allergies). 09/23/20  Yes [provider]    Blood pressure (!) 140/75, pulse 78, height 5' (1.524 m), weight 145 lb (65.8 kg), SpO2 95%. Exam: General: Communicates without difficulty, well nourished, no acute distress. Head: Normocephalic, no evidence injury, no tenderness, facial buttresses intact without stepoff. Face/sinus: No tenderness to palpation and percussion. Facial movement is normal and symmetric. Eyes: PERRL, EOMI. No scleral icterus, conjunctivae clear. Neuro: CN II exam reveals vision grossly intact.  No nystagmus at any point of gaze. Ears: Auricles well formed without lesions.  Ear canals are intact without mass or lesion.  No erythema or edema is appreciated.  The TMs are intact without fluid. Nose: External evaluation reveals normal support and skin without lesions.  Dorsum is intact.  Anterior rhinoscopy reveals normal mucosa over anterior aspect of inferior turbinates and intact septum.  No purulence noted. Oral:  Oral cavity and oropharynx are intact, symmetric, without erythema or edema.  Mucosa is moist without lesions. Neck: Full range of motion without pain.  There is no significant lymphadenopathy.  No masses palpable.  Thyroid  bed within normal limits to palpation.  Parotid glands and submandibular glands equal bilaterally without mass.  Trachea is midline. Neuro:  CN 2-12 grossly intact.   Assessment: 1.   Intermittent subjective left ear tinnitus. 2.  Her ear canals, tympanic membranes, and middle ear spaces are normal.  No middle ear effusion or infection is noted.  Plan: 1.  The physical exam findings are reviewed with the patient. 2.  The pathophysiology of tinnitus is discussed with the patient. 3.  The strategies to cope with tinnitus, including the use of masker, hearing aids, tinnitus retraining therapy, and avoidance of caffeine  and alcohol are discussed.  4.  The patient will return for reevaluation in 6 months with a hearing test.  No audiologist is available today.  Donna Andersen W Heiley Shaikh 05/13/2024, 9:47 AM

## 2024-05-14 DIAGNOSIS — Z85828 Personal history of other malignant neoplasm of skin: Secondary | ICD-10-CM | POA: Diagnosis not present

## 2024-05-14 DIAGNOSIS — L578 Other skin changes due to chronic exposure to nonionizing radiation: Secondary | ICD-10-CM | POA: Diagnosis not present

## 2024-05-14 DIAGNOSIS — D239 Other benign neoplasm of skin, unspecified: Secondary | ICD-10-CM | POA: Diagnosis not present

## 2024-05-14 DIAGNOSIS — D171 Benign lipomatous neoplasm of skin and subcutaneous tissue of trunk: Secondary | ICD-10-CM | POA: Diagnosis not present

## 2024-05-14 DIAGNOSIS — Z86018 Personal history of other benign neoplasm: Secondary | ICD-10-CM | POA: Diagnosis not present

## 2024-05-14 DIAGNOSIS — Z85820 Personal history of malignant melanoma of skin: Secondary | ICD-10-CM | POA: Diagnosis not present

## 2024-05-14 DIAGNOSIS — L570 Actinic keratosis: Secondary | ICD-10-CM | POA: Diagnosis not present

## 2024-05-14 DIAGNOSIS — L821 Other seborrheic keratosis: Secondary | ICD-10-CM | POA: Diagnosis not present

## 2024-05-14 DIAGNOSIS — D225 Melanocytic nevi of trunk: Secondary | ICD-10-CM | POA: Diagnosis not present

## 2024-05-19 DIAGNOSIS — M25511 Pain in right shoulder: Secondary | ICD-10-CM | POA: Diagnosis not present

## 2024-05-19 DIAGNOSIS — M9907 Segmental and somatic dysfunction of upper extremity: Secondary | ICD-10-CM | POA: Diagnosis not present

## 2024-05-19 DIAGNOSIS — M25521 Pain in right elbow: Secondary | ICD-10-CM | POA: Diagnosis not present

## 2024-05-19 DIAGNOSIS — M6281 Muscle weakness (generalized): Secondary | ICD-10-CM | POA: Diagnosis not present

## 2024-05-19 DIAGNOSIS — M25531 Pain in right wrist: Secondary | ICD-10-CM | POA: Diagnosis not present

## 2024-06-10 ENCOUNTER — Ambulatory Visit (INDEPENDENT_AMBULATORY_CARE_PROVIDER_SITE_OTHER)

## 2024-06-10 VITALS — Ht 60.0 in | Wt 145.0 lb

## 2024-06-10 DIAGNOSIS — Z0000 Encounter for general adult medical examination without abnormal findings: Secondary | ICD-10-CM

## 2024-06-10 NOTE — Progress Notes (Addendum)
 Subjective:   Donna Andersen is a 83 y.o. who presents for a Medicare Wellness preventive visit.  As a reminder, Annual Wellness Visits don't include a physical exam, and some assessments may be limited, especially if this visit is performed virtually. We may recommend an in-person follow-up visit with your provider if needed.  Visit Complete: Virtual I connected with  Donna Andersen on 06/10/24 by a audio enabled telemedicine application and verified that I am speaking with the correct person using two identifiers.  Patient Location: Home  Provider Location: Home Office  I discussed the limitations of evaluation and management by telemedicine. The patient expressed understanding and agreed to proceed.  Vital Signs: Because this visit was a virtual/telehealth visit, some criteria may be missing or patient reported. Any vitals not documented were not able to be obtained and vitals that have been documented are patient reported.  VideoDeclined- This patient declined Librarian, academic. Therefore the visit was completed with audio only.  Persons Participating in Visit: Patient.  AWV Questionnaire: No: Patient Medicare AWV questionnaire was not completed prior to this visit.  Cardiac Risk Factors include: advanced age (>30men, >22 women);dyslipidemia;hypertension     Objective:    Today's Vitals   06/10/24 0808  Weight: 145 lb (65.8 kg)  Height: 5' (1.524 m)   Body mass index is 28.32 kg/m.     06/10/2024    8:13 AM 05/30/2023   11:40 AM 05/07/2023    3:10 PM 04/10/2023   11:09 AM 04/05/2023    8:14 AM 03/04/2023    8:03 AM 02/26/2023    4:15 PM  Advanced Directives  Does Patient Have a Medical Advance Directive? Yes Yes Yes Yes Yes Yes Yes  Type of Estate agent of [place];Living will Healthcare Power of [place];Living will Healthcare Power of [place];Living will Healthcare Power of [place];Living will Healthcare Power of  [place];Living will Healthcare Power of [place];Living will   Does patient want to make changes to medical advance directive? No - Patient declined No - Patient declined  No - Patient declined   No - Patient declined  Copy of Healthcare Power of Attorney in Chart? Yes - validated most recent copy scanned in chart (See row information) Yes - validated most recent copy scanned in chart (See row information) No - copy requested No - copy requested  Yes - validated most recent copy scanned in chart (See row information) Yes - validated most recent copy scanned in chart (See row information)    Current Medications (verified) Outpatient Encounter Medications as of 06/10/2024  Medication Sig   amLODipine  (NORVASC ) 2.5 MG tablet Take 1 tablet (2.5 mg total) by mouth daily.   docusate sodium  (COLACE) 100 MG capsule Take 100 mg by mouth daily as needed for mild constipation.   EPINEPHrine 0.3 mg/0.3 mL IJ SOAJ injection Inject 0.3 mg into the muscle as needed for anaphylaxis.   olmesartan  (BENICAR ) 20 MG tablet Take 1 tablet (20 mg total) by mouth daily.   rosuvastatin  (CRESTOR ) 5 MG tablet TAKE 1 TABLET BY MOUTH EVERY OTHER DAY   SYMBICORT 80-4.5 MCG/ACT inhaler Inhale 2 puffs into the lungs daily as needed (allergies).   No facility-administered encounter medications on file as of 06/10/2024.    Allergies (verified) Bee venom, Atorvastatin, Lisinopril, Losartan potassium, Shrimp extract, Latex, Other, and Sulfonamide derivatives   History: Past Medical History:  Diagnosis Date   Acute cystitis 09/26/2007   recurrent UTI's   Allergy    Asthma  as a child   Atypical nevus 11/17/2002   Upper Center Back - Moderate   Atypical nevus 11/17/2002   Right Lower Abdomen - Moderate   Atypical nevus 04/23/2006   Left Upper Arm - Slight to Moderate   Atypical nevus 04/23/2006   Left Outer Lower Leg - Slight to Moderate   Atypical nevus 04/15/2012   Left Upper Buttock - Mild   Atypical nevus  08/25/2014   Right Inner Knee - Moderate   Atypical nevus 08/25/2014   Left Scapula - Severe   Basal cell carcinoma 08/25/2014   left axilla tx -curet and cautery   COUGH DUE TO ACE INHIBITORS 06/20/2009   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 Dr. 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   Right Buccal Cheek (in situ)([id number])   WEIGHT GAIN 06/08/2008   Past Surgical History:  Procedure Laterality Date   BREAST LUMPECTOMY  11/05/1974   benign   COLONOSCOPY     POLYPECTOMY     ROBOT ASSISTED LAPAROSCOPIC NEPHRECTOMY Left 09/27/2021   Procedure: XI ROBOTIC ASSISTED LAPAROSCOPIC RADICAL NEPHRECTOMY;  Surgeon: Devere Lonni Righter, MD;  Location: [facility];  Service: Urology;  Laterality: Left;   ROBOTIC ASSISTED SALPINGO OOPHERECTOMY Bilateral 04/10/2023   Procedure: XI ROBOTIC ASSISTED SALPINGO OOPHORECTOMY;  Surgeon: Viktoria Comer SAUNDERS, MD;  Location: [facility];  Service: Gynecology;  Laterality: Bilateral;   SKIN CANCER EXCISION     THYROID  SURGERY  11/05/2009   left nodule removed   VAGINAL DELIVERY     x1   VAGINAL HYSTERECTOMY  11/05/1972   Family History  Problem Relation Age of Onset   Cancer Mother        Ovarian Cancer   Ovarian cancer Mother    Hypertension Mother    Thyroid  disease Mother    Lymphoma Sister    Cancer Sister        Breast Cancer, Kidney Cancer   Leukemia Sister    Breast cancer Maternal Aunt    Colon cancer Neg Hx    Rectal cancer Neg Hx    Stomach cancer Neg Hx    Endometrial  cancer Neg Hx    Prostate cancer Neg Hx    Pancreatic cancer Neg Hx    Social History   Socioeconomic History   Marital status: Married    Spouse name: Not on file   Number of children: Not on file   Years of education: Not on file   Highest education level: Not on file  Occupational History   Occupation: Homemaker    Employer: RETIRED  Tobacco Use   Smoking status: Never   Smokeless tobacco: Never  Vaping Use   Vaping status: Never Used  Substance and Sexual Activity   Alcohol use: Not Currently    Comment: socially-2 glasses a week   Drug use: Never   Sexual activity: Not Currently    Birth control/protection: Surgical  Other Topics Concern   Not on file  Social History Narrative   Does not work outside the  home   Social Drivers of Health   Financial Resource Strain: Low Risk  (06/10/2024)   Overall Financial Resource Strain (CARDIA)    Difficulty of Paying Living Expenses: Not hard at all  Food Insecurity: No Food Insecurity (06/10/2024)   Hunger Vital Sign    Worried About Running Out of Food in the Last Year: Never true    Ran Out of Food in the Last Year: Never true  Transportation Needs: No Transportation Needs (06/10/2024)   PRAPARE - Administrator, Civil Service (Medical): No    Lack of Transportation (Non-Medical): No  Physical Activity: Sufficiently Active (06/10/2024)   Exercise Vital Sign    Days of Exercise per Week: 4 days    Minutes of Exercise per Session: 150+ min  Stress: No Stress Concern Present (06/10/2024)   [employer] of Occupational Health - Occupational Stress Questionnaire    Feeling of Stress: Not at all  Social Connections: Socially Integrated (06/10/2024)   Social Connection and Isolation Panel    Frequency of Communication with Friends and Family: More than three times a week    Frequency of Social Gatherings with Friends and Family: More than three times a week    Attends Religious Services: More than 4 times per year     Active Member of [employer] or Organizations: Yes    Attends Engineer, structural: More than 4 times per year    Marital Status: Married    Tobacco Counseling Counseling given: Not Answered    Clinical Intake:  Pre-visit preparation completed: Yes  Pain : No/denies pain     BMI - recorded: 28.32 Nutritional Status: BMI 25 -29 Overweight Nutritional Risks: None Diabetes: No  Lab Results  Component Value Date   HGBA1C 5.6 03/07/2018   HGBA1C 5.7 03/04/2017   HGBA1C 5.6 02/28/2016     How often do you need to have someone help you when you read instructions, pamphlets, or other written materials from your doctor or pharmacy?: 1 - Never  Interpreter Needed?: No  Information entered by :: Ellouise Haws, LPN   Activities of Daily Living     06/10/2024    8:09 AM  In your present state of health, do you have any difficulty performing the following activities:  Hearing? 0  Vision? 0  Difficulty concentrating or making decisions? 0  Walking or climbing stairs? 0  Dressing or bathing? 0  Doing errands, shopping? 0  Preparing Food and eating ? N  Using the Toilet? N  In the past six months, have you accidently leaked urine? N  Do you have problems with loss of bowel control? N  Managing your Medications? N  Managing your Finances? N  Housekeeping or managing your Housekeeping? N    Patient Care Team: Jodie Lavern CROME, MD as PCP - General (Family Medicine) Fleeta Smock, Lamar BROCKS, MD as Consulting Physician (Allergy and Immunology) Livingston Rigg, MD as Consulting Physician (Dermatology) Leslee Reusing, MD as Consulting Physician (Ophthalmology)  I have updated your Care Teams any recent Medical Services you may have received from other providers in the past year.     Assessment:   This is a routine wellness examination for [facility].  Hearing/Vision screen Hearing Screening - Comments:: Pt denies any hearing issues  Vision Screening - Comments:: Wears rx  glasses - up to date with routine eye exams with Dr Reusing Leslee    Goals Addressed             This  Visit's Progress    Patient Stated       Weight loss       Depression Screen     06/10/2024    8:10 AM 03/27/2024    9:35 AM 09/27/2023   11:21 AM 05/30/2023   11:39 AM 03/27/2023    1:03 PM 09/17/2022   10:56 AM 03/22/2022    3:05 PM  PHQ 2/9 Scores  PHQ - 2 Score 0 0 0 0 0 0 0    Fall Risk     06/10/2024    8:14 AM 03/27/2024    9:35 AM 09/27/2023   11:21 AM 05/30/2023   11:41 AM 03/27/2023    1:03 PM  Fall Risk   Falls in the past year? 0 0 0 0 0  Number falls in past yr: 0 0 0 0 0  Injury with Fall? 0 0 0 0 0  Risk for fall due to : No Fall Risks No Fall Risks No Fall Risks Impaired vision No Fall Risks  Follow up Falls prevention discussed Falls evaluation completed Falls evaluation completed Falls prevention discussed Falls evaluation completed    MEDICARE RISK AT HOME:  Medicare Risk at Home Any stairs in or around the home?: Yes If so, are there any without handrails?: No Home free of loose throw rugs in walkways, pet beds, electrical cords, etc?: Yes Adequate lighting in your home to reduce risk of falls?: Yes Life alert?: No Use of a cane, walker or w/c?: No Grab bars in the bathroom?: No Shower chair or bench in shower?: No Elevated toilet seat or a handicapped toilet?: No  TIMED UP AND GO:  Was the test performed?  No  Cognitive Function: 6CIT completed        06/10/2024    8:14 AM 03/22/2022    3:08 PM  6CIT Screen  What Year? 0 points 0 points  What month? 0 points 0 points  What time? 0 points 0 points  Count back from 20 0 points 0 points  Months in reverse 0 points 0 points  Repeat phrase 0 points 0 points  Total Score 0 points 0 points    Immunizations Immunization History  Administered Date(s) Administered   PFIZER(Purple Top)SARS-COV-2 Vaccination 12/10/2019, 01/04/2020, 03/17/2020, 09/28/2020, 02/05/2022   Pneumococcal  Conjugate-13 02/16/2015   Pneumococcal Polysaccharide-23 10/11/2008, 09/20/2016, 09/13/2017, 09/12/2018, 09/01/2019, 03/17/2020   Zoster, Live 02/22/2015    Screening Tests Health Maintenance  Topic Date Due   COVID-19 Vaccine (6 - 2024-25 season) 07/07/2023   MAMMOGRAM  01/09/2025   Medicare Annual Wellness (AWV)  06/10/2025   DEXA SCAN  09/09/2025   Pneumococcal Vaccine: 50+ Years  Completed   Hepatitis B Vaccines  Aged Out   HPV VACCINES  Aged Out   Meningococcal B Vaccine  Aged Out   DTaP/Tdap/Td  Discontinued   INFLUENZA VACCINE  Discontinued   Colonoscopy  Discontinued   Hepatitis C Screening  Discontinued   Zoster Vaccines- Shingrix  Discontinued    Health Maintenance  Health Maintenance Due  Topic Date Due   COVID-19 Vaccine (6 - 2024-25 season) 07/07/2023   Health Maintenance Items Addressed: See Nurse Notes at the end of this note  Additional Screening:  Vision Screening: Recommended annual ophthalmology exams for early detection of glaucoma and other disorders of the eye. Would you like a referral to an eye doctor? No    Dental Screening: Recommended annual dental exams for proper oral hygiene  Community Resource Referral / Chronic Care Management: CRR  required this visit?  No   CCM required this visit?  No   Plan:    I have personally reviewed and noted the following in the patient's chart:   Medical and social history Use of alcohol, tobacco or illicit drugs  Current medications and supplements including opioid prescriptions. Patient is not currently taking opioid prescriptions. Functional ability and status Nutritional status Physical activity Advanced directives List of other physicians Hospitalizations, surgeries, and ER visits in previous 12 months Vitals Screenings to include cognitive, depression, and falls Referrals and appointments  In addition, I have reviewed and discussed with patient certain preventive protocols, quality metrics,  and best practice recommendations. A written personalized care plan for preventive services as well as general preventive health recommendations were provided to patient.   Ellouise VEAR Haws, LPN   11/07/7972   After Visit Summary: (MyChart) Due to this being a telephonic visit, the after visit summary with patients personalized plan was offered to patient via MyChart   Notes: Nothing significant to report at this time.

## 2024-06-10 NOTE — Patient Instructions (Signed)
 Ms. Marasco , Thank you for taking time out of your busy schedule to complete your Annual Wellness Visit with me. I enjoyed our conversation and look forward to speaking with you again next year. I, as well as your care team,  appreciate your ongoing commitment to your health goals. Please review the following plan we discussed and let me know if I can assist you in the future. Your Game plan/ To Do List    Referrals: If you haven't heard from the office you've been referred to, please reach out to them at the phone provided.   Follow up Visits: We will see or speak with you next year for your Next Medicare AWV with our clinical staff Have you seen your provider in the last 6 months (3 months if uncontrolled diabetes)? Yes  Clinician Recommendations:  Aim for 30 minutes of exercise or brisk walking, 6-8 glasses of water , and 5 servings of fruits and vegetables each day.       This is a list of the screenings recommended for you:  Health Maintenance  Topic Date Due   COVID-19 Vaccine (6 - 2024-25 season) 07/07/2023   Mammogram  01/09/2025   Medicare Annual Wellness Visit  06/10/2025   DEXA scan (bone density measurement)  09/09/2025   Pneumococcal Vaccine for age over 31  Completed   Hepatitis B Vaccine  Aged Out   HPV Vaccine  Aged Out   Meningitis B Vaccine  Aged Out   DTaP/Tdap/Td vaccine  Discontinued   Flu Shot  Discontinued   Colon Cancer Screening  Discontinued   Hepatitis C Screening  Discontinued   Zoster (Shingles) Vaccine  Discontinued    Advanced directives: (In Chart) A copy of your advanced directives are scanned into your chart should your provider ever need it. Advance Care Planning is important because it:  [x]  Makes sure you receive the medical care that is consistent with your values, goals, and preferences  [x]  It provides guidance to your family and loved ones and reduces their decisional burden about whether or not they are making the right decisions based on  your wishes.  Follow the link provided in your after visit summary or read over the paperwork we have mailed to you to help you started getting your Advance Directives in place. If you need assistance in completing these, please reach out to us  so that we can help you!  See attachments for Preventive Care and Fall Prevention Tips.

## 2024-06-30 DIAGNOSIS — M1811 Unilateral primary osteoarthritis of first carpometacarpal joint, right hand: Secondary | ICD-10-CM | POA: Diagnosis not present

## 2024-06-30 DIAGNOSIS — M9908 Segmental and somatic dysfunction of rib cage: Secondary | ICD-10-CM | POA: Diagnosis not present

## 2024-06-30 DIAGNOSIS — G8929 Other chronic pain: Secondary | ICD-10-CM | POA: Diagnosis not present

## 2024-06-30 DIAGNOSIS — M25541 Pain in joints of right hand: Secondary | ICD-10-CM | POA: Diagnosis not present

## 2024-06-30 DIAGNOSIS — M9901 Segmental and somatic dysfunction of cervical region: Secondary | ICD-10-CM | POA: Diagnosis not present

## 2024-06-30 DIAGNOSIS — M9902 Segmental and somatic dysfunction of thoracic region: Secondary | ICD-10-CM | POA: Diagnosis not present

## 2024-06-30 DIAGNOSIS — G2589 Other specified extrapyramidal and movement disorders: Secondary | ICD-10-CM | POA: Diagnosis not present

## 2024-06-30 DIAGNOSIS — M9907 Segmental and somatic dysfunction of upper extremity: Secondary | ICD-10-CM | POA: Diagnosis not present

## 2024-06-30 DIAGNOSIS — M9903 Segmental and somatic dysfunction of lumbar region: Secondary | ICD-10-CM | POA: Diagnosis not present

## 2024-06-30 DIAGNOSIS — M25511 Pain in right shoulder: Secondary | ICD-10-CM | POA: Diagnosis not present

## 2024-07-28 DIAGNOSIS — M9907 Segmental and somatic dysfunction of upper extremity: Secondary | ICD-10-CM | POA: Diagnosis not present

## 2024-07-28 DIAGNOSIS — G8929 Other chronic pain: Secondary | ICD-10-CM | POA: Diagnosis not present

## 2024-07-28 DIAGNOSIS — M9902 Segmental and somatic dysfunction of thoracic region: Secondary | ICD-10-CM | POA: Diagnosis not present

## 2024-07-28 DIAGNOSIS — M25511 Pain in right shoulder: Secondary | ICD-10-CM | POA: Diagnosis not present

## 2024-07-28 DIAGNOSIS — M9908 Segmental and somatic dysfunction of rib cage: Secondary | ICD-10-CM | POA: Diagnosis not present

## 2024-07-28 DIAGNOSIS — M9901 Segmental and somatic dysfunction of cervical region: Secondary | ICD-10-CM | POA: Diagnosis not present

## 2024-07-28 DIAGNOSIS — M79644 Pain in right finger(s): Secondary | ICD-10-CM | POA: Diagnosis not present

## 2024-09-23 ENCOUNTER — Other Ambulatory Visit: Payer: Self-pay | Admitting: Family Medicine

## 2024-09-29 ENCOUNTER — Encounter: Payer: Self-pay | Admitting: Family Medicine

## 2024-09-29 ENCOUNTER — Ambulatory Visit: Admitting: Family Medicine

## 2024-09-29 VITALS — BP 120/64 | HR 64 | Temp 97.2°F | Ht 60.0 in | Wt 144.4 lb

## 2024-09-29 DIAGNOSIS — I10 Essential (primary) hypertension: Secondary | ICD-10-CM

## 2024-09-29 DIAGNOSIS — R300 Dysuria: Secondary | ICD-10-CM

## 2024-09-29 DIAGNOSIS — E782 Mixed hyperlipidemia: Secondary | ICD-10-CM

## 2024-09-29 DIAGNOSIS — G720 Drug-induced myopathy: Secondary | ICD-10-CM | POA: Insufficient documentation

## 2024-09-29 DIAGNOSIS — N1831 Chronic kidney disease, stage 3a: Secondary | ICD-10-CM

## 2024-09-29 DIAGNOSIS — Z85528 Personal history of other malignant neoplasm of kidney: Secondary | ICD-10-CM

## 2024-09-29 DIAGNOSIS — T466X5A Adverse effect of antihyperlipidemic and antiarteriosclerotic drugs, initial encounter: Secondary | ICD-10-CM

## 2024-09-29 LAB — COMPREHENSIVE METABOLIC PANEL WITH GFR
ALT: 14 U/L (ref 0–35)
AST: 14 U/L (ref 0–37)
Albumin: 4.4 g/dL (ref 3.5–5.2)
Alkaline Phosphatase: 106 U/L (ref 39–117)
BUN: 21 mg/dL (ref 6–23)
CO2: 29 meq/L (ref 19–32)
Calcium: 9.5 mg/dL (ref 8.4–10.5)
Chloride: 103 meq/L (ref 96–112)
Creatinine, Ser: 1.1 mg/dL (ref 0.40–1.20)
GFR: 47.12 mL/min — ABNORMAL LOW (ref >60.00)
Glucose, Bld: 94 mg/dL (ref 70–99)
Potassium: 4.9 meq/L (ref 3.5–5.1)
Sodium: 138 meq/L (ref 135–145)
Total Bilirubin: 0.5 mg/dL (ref 0.2–1.2)
Total Protein: 6.7 g/dL (ref 6.0–8.3)

## 2024-09-29 LAB — LIPID PANEL
Cholesterol: 301 mg/dL — ABNORMAL HIGH (ref 0–200)
HDL: 51.4 mg/dL (ref >39.00)
LDL Cholesterol: 199 mg/dL — ABNORMAL HIGH (ref 0–99)
NonHDL: 249.63
Total CHOL/HDL Ratio: 6
Triglycerides: 253 mg/dL — ABNORMAL HIGH (ref 0.0–149.0)
VLDL: 50.6 mg/dL — ABNORMAL HIGH (ref 0.0–40.0)

## 2024-09-29 MED ORDER — AMLODIPINE BESYLATE 2.5 MG PO TABS: 1.2500 mg | ORAL_TABLET | Freq: Every day | ORAL | Status: AC

## 2024-09-29 MED ORDER — EZETIMIBE 10 MG PO TABS: 10.0000 mg | ORAL_TABLET | Freq: Every evening | ORAL | 3 refills | Status: AC

## 2024-09-29 NOTE — Patient Instructions (Signed)
 Please return in 6 months for your annual complete physical; please come fasting.   I will release your lab results to you on your MyChart account with further instructions. You may see the results before I do, but when I review them I will send you a message with my report or have my assistant call you if things need to be discussed. Please reply to my message with any questions. Thank you!   If you have any questions or concerns, please don't hesitate to send me a message via MyChart or call the office at [phone]. Thank you for visiting with us  today! It's our pleasure caring for you.

## 2024-09-29 NOTE — Progress Notes (Signed)
 Subjective  CC:  Chief Complaint  Patient presents with   Hypertension    F/U   Hyperlipidemia    Pt stopped Crestor  3 months ago due to leg stiffness and pain.     HPI: Donna Andersen is a 83 y.o. female who presents to the office today to address the problems listed above in the chief complaint. Hypertension f/u: Control is good . Pt reports she is doing well. taking medications as instructed, no medication side effects noted, no TIAs, no chest pain on exertion, no dyspnea on exertion, no swelling of ankles. However, taking 1.25mg  amlodipine  and 10mg  olmesartan ; feels shaky and vibrates if takes whole pills. Home daily bp readings are perfect: 120/60. Bp was too low when she took the full 2.5mg  dose of amlodipine . She denies adverse effects from his BP medications. Compliance with medication is good. She has white coat htn in the office HLD: had to stop crestor  due to leg pain. Feeling better now. Failed lipitor in past for same. Would be willing to try zetia .  H/o RCC: had clear lung CT and abdominal MRI. I reviewed reports today. Benign lung nodules on ct that are stable. Right renal cysts are benign.  CKD: no edema or nausea. Avoids nsaids. Due for recheck  Assessment  1. White coat syndrome with diagnosis of hypertension   2. History of renal cell carcinoma   3. Mixed hyperlipidemia   4. Chronic kidney disease, stage 3a (HCC)   5. Statin myopathy   6. Dysuria      Plan   Hypertension f/u: BP control is well controlled. Will see if amlodipine  1.25-2.5 mg alone will control BP. She will stop the olmesartan  and monitor daily. Let me know if any problems. Check renal function and electrolytes. HLD off meds: check baseline again. Work on diet. Trial of zetia  CKD: recheck renal function. H/o nephrectomy so want bp 120/60 ideally.  Stain myopathy noted C/o dysuria intermittently. Denies vaginal rash. Check urine culture and treat if positive.   Education regarding management  of these chronic disease states was given. Management strategies discussed on successive visits include dietary and exercise recommendations, goals of achieving and maintaining IBW, and lifestyle modifications aiming for adequate sleep and minimizing stressors.   Follow up: 6 mo for cpe  Orders Placed This Encounter  Procedures   Urine Culture   Comprehensive metabolic panel with GFR   Lipid panel   Meds ordered this encounter  Medications   ezetimibe  (ZETIA ) 10 MG tablet    Sig: Take 1 tablet (10 mg total) by mouth at bedtime.    Dispense:  90 tablet    Refill:  3   amLODipine  (NORVASC ) 2.5 MG tablet    Sig: Take 0.5 tablets (1.25 mg total) by mouth daily.      BP Readings from Last 3 Encounters:  09/29/24 120/64  05/12/24 (!) 140/75  03/27/24 120/70   Wt Readings from Last 3 Encounters:  09/29/24 144 lb 6.4 oz (65.5 kg)  06/10/24 145 lb (65.8 kg)  05/12/24 145 lb (65.8 kg)    Lab Results  Component Value Date   CHOL 250 (H) 03/27/2024   CHOL 224 (H) 09/27/2023   CHOL 213 (H) 03/12/2022   Lab Results  Component Value Date   HDL 59.30 03/27/2024   HDL 59.80 09/27/2023   HDL 60.50 03/12/2022   Lab Results  Component Value Date   LDLCALC 155 (H) 03/27/2024   LDLCALC 129 (H) 09/27/2023   LDLCALC 116 (  H) 03/12/2022   Lab Results  Component Value Date   TRIG 179.0 (H) 03/27/2024   TRIG 176.0 (H) 09/27/2023   TRIG 185.0 (H) 03/12/2022   Lab Results  Component Value Date   CHOLHDL 4 03/27/2024   CHOLHDL 4 09/27/2023   CHOLHDL 4 03/12/2022   Lab Results  Component Value Date   LDLDIRECT 109.0 02/16/2015   LDLDIRECT 145.4 01/26/2013   LDLDIRECT 130.4 01/25/2012   Lab Results  Component Value Date   CREATININE 1.18 03/27/2024   BUN 28 (H) 03/27/2024   NA 139 03/27/2024   K 5.0 03/27/2024   CL 105 03/27/2024   CO2 25 03/27/2024    The ASCVD Risk score (Arnett DK, et al., 2019) failed to calculate for the following reasons:   The 2019 ASCVD risk  score is only valid for ages 46 to 2  I reviewed the patients updated PMH, FH, and SocHx.    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    Priority: High   Tubular adenoma of colon 09/07/2021    Priority: High   White coat syndrome with diagnosis of hypertension 11/22/2008    Priority: High   Mixed hyperlipidemia 07/21/2007    Priority: High   Fracture Risk Assessment Score (FRAX) indicating greater than 3% risk for hip fracture 09/27/2023    Priority: Medium    Chronic kidney disease, stage 3a (HCC) 03/12/2022    Priority: Medium    Osteopenia 07/21/2007    Priority: Medium    Environmental allergies 06/27/2018    Priority: Low   Fibrocystic breast changes, bilateral 10/10/2009    Priority: Low   ACE inhibitor intolerance 06/20/2009    Priority: Low   Statin myopathy 09/29/2024   Left-sided tinnitus 05/13/2024   Dermatitis, contact 04/23/2023   Adnexal mass 04/10/2023    Allergies: Bee venom, Atorvastatin, Lisinopril, Losartan potassium, Shrimp extract, Statins, Latex, Other, and Sulfonamide derivatives  Social History: Patient  reports that she has never smoked. She has never used smokeless tobacco. She reports that she does not currently use alcohol. She reports that she does not use drugs.  Current Meds  Medication Sig   docusate sodium  (COLACE) 100 MG capsule Take 100 mg by mouth daily as needed for mild constipation.   EPINEPHrine 0.3 mg/0.3 mL IJ SOAJ injection Inject 0.3 mg into the muscle as needed for anaphylaxis.   ezetimibe  (ZETIA ) 10 MG tablet Take 1 tablet (10 mg total) by mouth at bedtime.   SYMBICORT 80-4.5 MCG/ACT inhaler Inhale 2 puffs into the lungs daily as needed (allergies).   [DISCONTINUED] amLODipine  (NORVASC ) 2.5 MG tablet TAKE 1 TABLET BY MOUTH EVERY DAY   [DISCONTINUED] olmesartan  (BENICAR ) 20 MG tablet Take 1 tablet (20 mg total) by mouth daily.    Review of  Systems: Cardiovascular: negative for chest pain, palpitations, leg swelling, orthopnea Respiratory: negative for SOB, wheezing or persistent cough Gastrointestinal: negative for abdominal pain Genitourinary: negative for dysuria or gross hematuria  Objective  Vitals: BP 120/64 Comment: by daily home readings  Pulse 64   Temp (!) 97.2 F (36.2 C) (Temporal)   Ht 5' (1.524 m)   Wt 144 lb 6.4 oz (65.5 kg)   LMP  (LMP Unknown)   SpO2 97%   BMI 28.20 kg/m  General: no acute distress  Psych:  Alert and oriented, normal mood and affect HEENT:  Normocephalic, atraumatic, supple neck  Cardiovascular:  RRR without murmur. no  edema Respiratory:  Good breath sounds bilaterally, CTAB with normal respiratory effort  Commons side effects, risks, benefits, and alternatives for medications and treatment plan prescribed today were discussed, and the patient expressed understanding of the given instructions. Patient is instructed to call or message via MyChart if he/she has any questions or concerns regarding our treatment plan. No barriers to understanding were identified. We discussed Red Flag symptoms and signs in detail. Patient expressed understanding regarding what to do in case of urgent or emergency type symptoms.  Medication list was reconciled, printed and provided to the patient in AVS. Patient instructions and summary information was reviewed with the patient as documented in the AVS. This note was prepared with assistance of Dragon voice recognition software. Occasional wrong-word or sound-a-like substitutions may have occurred due to the inherent limitation

## 2024-10-01 LAB — URINE CULTURE
MICRO NUMBER:: 17281461
SPECIMEN QUALITY:: ADEQUATE

## 2024-10-07 ENCOUNTER — Other Ambulatory Visit: Payer: Self-pay | Admitting: Family Medicine

## 2024-10-07 MED ORDER — NITROFURANTOIN MONOHYD MACRO 100 MG PO CAPS: 100.0000 mg | ORAL_CAPSULE | Freq: Two times a day (BID) | ORAL | 0 refills | Status: AC

## 2024-10-07 MED ORDER — FLUCONAZOLE 150 MG PO TABS: ORAL_TABLET | ORAL | 0 refills | Status: AC

## 2024-10-08 ENCOUNTER — Ambulatory Visit: Payer: Self-pay | Admitting: Family Medicine

## 2024-10-08 NOTE — Progress Notes (Signed)
 Discussed with pt. Macrobid  for uti sent in, w/ diflucan  Started zetia . Can't tolerate statins Renal function is stable.

## 2024-10-12 ENCOUNTER — Encounter (INDEPENDENT_AMBULATORY_CARE_PROVIDER_SITE_OTHER): Payer: Self-pay | Admitting: Otolaryngology

## 2024-10-12 ENCOUNTER — Ambulatory Visit (INDEPENDENT_AMBULATORY_CARE_PROVIDER_SITE_OTHER): Admitting: Otolaryngology

## 2024-10-12 ENCOUNTER — Ambulatory Visit (INDEPENDENT_AMBULATORY_CARE_PROVIDER_SITE_OTHER): Admitting: Audiology

## 2024-10-12 VITALS — BP 152/79 | HR 93 | Temp 98.0°F | Ht 60.0 in | Wt 143.0 lb

## 2024-10-12 DIAGNOSIS — H9312 Tinnitus, left ear: Secondary | ICD-10-CM

## 2024-10-12 DIAGNOSIS — H903 Sensorineural hearing loss, bilateral: Secondary | ICD-10-CM

## 2024-10-12 NOTE — Progress Notes (Signed)
  [address], [address] [place], [place] [address] [phone]  Audiological Evaluation    Name: Donna Andersen     DOB:   Sep 30, 1941      MRN:   [id number]                                                                                     Service Date: 10/12/2024     Accompanied by: self   Patient comes today after Dr. Karis, ENT sent a referral for a hearing evaluation due to concerns with tinnitus.   Symptoms Yes Details  Hearing loss  []    Tinnitus  [x]  Roaring in both ears, sometimes one- said yesterday was loud and went away after ear popped  Ear pain/ infections/pressure  []    Balance problems  []    Noise exposure history  []    Previous ear surgeries  []    Family history of hearing loss  []    Amplification  []    Other  []      Otoscopy: Right ear: Clear external ear canal and notable landmarks visualized on the tympanic membrane. Left ear:  Clear external ear canal and notable landmarks visualized on the tympanic membrane.  Tympanometry: Right ear: Type A - Normal external ear canal volume with normal middle ear pressure and normal tympanic membrane compliance. Findings are consistent with normal middle ear function. Left ear: Type A - Normal external ear canal volume with normal middle ear pressure and normal tympanic membrane compliance. Findings are consistent with normal middle ear function.  Hearing Evaluation The hearing test results were completed under headphones and re-checked with inserts and results are deemed to be of good reliability. Test technique:  conventional    Pure tone Audiometry: Right ear- Normal to moderate sensorineural hearing loss from 250 Hz - 8000 Hz. Left ear-  Normal to moderately severe sensorineural hearing loss from 250 Hz - 8000 Hz.  Speech Audiometry: Right ear- Speech Reception Threshold (SRT) was obtained at 20 dBHL. Left ear-Speech Reception Threshold (SRT) was obtained at 20 dBHL.   Word Recognition Score Tested using  NU-6 (recorded) Right ear: 100% was obtained at a presentation level of 60 dBHL with contralateral masking which is deemed as  excellent. Left ear: 100% was obtained at a presentation level of 60 dBHL with contralateral masking which is deemed as  excellent.   Impression: There is a slight difference at some pitches, worse in the left ear.  Recommendations: Follow up with ENT as scheduled.  Repeat audiogram after medical care. Consider a communication needs assessment for amplification after medical clearance is obtained, if needed.   Mariette Cowley MARIE LEROUX-MARTINEZ, AUD

## 2024-10-12 NOTE — Progress Notes (Signed)
 Patient ID: Donna Andersen, female   DOB: 11-20-1940, 83 y.o.   MRN: [id number]  Follow up: Left ear tinnitus  Discussed the use of AI scribe software for clinical note transcription with the patient, who gave verbal consent to proceed.  History of Present Illness Donna Andersen is an 83 year old female who presents with persistent ringing in the left ear.  She experiences persistent ringing in the left ear, more pronounced than in the right ear, which began after attending a concert. Although it has improved, it persists and is described as 'more like a cloudy sound.'  She experiences sinus pressure, drainage, and a feeling of stuffiness, consistent with allergies. No ringing wakes her up at night, and it does not bother her sleep.  Currently she denies any otalgia, otorrhea, or vertigo.   Exam: General: Communicates without difficulty, well nourished, no acute distress. Head: Normocephalic, no evidence injury, no tenderness, facial buttresses intact without stepoff. Face/sinus: No tenderness to palpation and percussion. Facial movement is normal and symmetric. Eyes: PERRL, EOMI. No scleral icterus, conjunctivae clear. Neuro: CN II exam reveals vision grossly intact.  No nystagmus at any point of gaze. Ears: Auricles well formed without lesions.  Ear canals are intact without mass or lesion.  No erythema or edema is appreciated.  The TMs are intact without fluid. Nose: External evaluation reveals normal support and skin without lesions.  Dorsum is intact.  Anterior rhinoscopy reveals congested mucosa over anterior aspect of inferior turbinates and intact septum.  No purulence noted. Oral:  Oral cavity and oropharynx are intact, symmetric, without erythema or edema.  Mucosa is moist without lesions. Neck: Full range of motion without pain.  There is no significant lymphadenopathy.  No masses palpable.  Thyroid  bed within normal limits to palpation.  Parotid glands and submandibular glands equal  bilaterally without mass.  Trachea is midline. Neuro:  CN 2-12 grossly intact.    Her hearing test shows bilateral high-frequency sensorineural hearing loss, slightly worse on the left side. 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 Likely exacerbated by exposure to loud noise at a concert. Tinnitus is persistent but has improved in severity. No signs of infection or redness in the ears.  - The physical exam findings and the hearing test results are reviewed with the patient. - The strategies to cope with tinnitus, including the use of masker, hearing aids, tinnitus retraining therapy, and avoidance of caffeine  and alcohol are discussed. - Will schedule follow-up hearing test in one year to monitor changes in hearing. - Recommended over-the-counter allergy medications such as Claritin or Allegra for sinus congestion.

## 2025-03-30 ENCOUNTER — Ambulatory Visit: Admitting: Family Medicine

## 2025-06-15 ENCOUNTER — Ambulatory Visit
